# Patient Record
Sex: Female | Born: 1965 | Race: Black or African American | Hispanic: No | Marital: Married | Smoking: Never smoker
Health system: Southern US, Community
[De-identification: ages and names within clinical notes are randomized; demographics above are authoritative.]

## PROBLEM LIST (undated history)

## (undated) DIAGNOSIS — D49519 Neoplasm of unspecified behavior of unspecified kidney: Secondary | ICD-10-CM

## (undated) DIAGNOSIS — R011 Cardiac murmur, unspecified: Secondary | ICD-10-CM

## (undated) DIAGNOSIS — R198 Other specified symptoms and signs involving the digestive system and abdomen: Secondary | ICD-10-CM

## (undated) DIAGNOSIS — I10 Essential (primary) hypertension: Secondary | ICD-10-CM

## (undated) DIAGNOSIS — E119 Type 2 diabetes mellitus without complications: Secondary | ICD-10-CM

## (undated) DIAGNOSIS — E785 Hyperlipidemia, unspecified: Secondary | ICD-10-CM

## (undated) DIAGNOSIS — M1990 Unspecified osteoarthritis, unspecified site: Secondary | ICD-10-CM

---

## 2004-08-26 HISTORY — PX: SHOULDER SURGERY: SHX246

## 2007-01-19 ENCOUNTER — Encounter: Payer: Self-pay | Admitting: Orthopedic Surgery

## 2007-01-25 ENCOUNTER — Encounter: Payer: Self-pay | Admitting: Orthopedic Surgery

## 2007-02-24 ENCOUNTER — Encounter: Payer: Self-pay | Admitting: Orthopedic Surgery

## 2007-03-27 ENCOUNTER — Encounter: Payer: Self-pay | Admitting: Orthopedic Surgery

## 2007-11-12 ENCOUNTER — Encounter: Payer: Self-pay | Admitting: Orthopedic Surgery

## 2007-11-25 ENCOUNTER — Encounter: Payer: Self-pay | Admitting: Orthopedic Surgery

## 2009-05-29 ENCOUNTER — Encounter: Payer: Self-pay | Admitting: Orthopedic Surgery

## 2009-06-26 ENCOUNTER — Encounter: Payer: Self-pay | Admitting: Orthopedic Surgery

## 2009-07-26 ENCOUNTER — Encounter: Payer: Self-pay | Admitting: Orthopedic Surgery

## 2015-07-11 ENCOUNTER — Ambulatory Visit (HOSPITAL_COMMUNITY)
Admission: RE | Admit: 2015-07-11 | Discharge: 2015-07-11 | Disposition: A | Discharge status: Home / Self Care | Payer: Medicare Other | Source: Ambulatory Visit | Attending: Urology | Admitting: Urology

## 2015-07-11 ENCOUNTER — Other Ambulatory Visit (HOSPITAL_COMMUNITY): Payer: Self-pay | Admitting: Urology

## 2015-07-11 DIAGNOSIS — D49512 Neoplasm of unspecified behavior of left kidney: Secondary | ICD-10-CM

## 2015-07-11 DIAGNOSIS — I10 Essential (primary) hypertension: Secondary | ICD-10-CM | POA: Insufficient documentation

## 2015-07-12 ENCOUNTER — Other Ambulatory Visit: Payer: Self-pay | Admitting: Urology

## 2015-07-31 ENCOUNTER — Encounter (HOSPITAL_COMMUNITY): Payer: Self-pay

## 2015-07-31 ENCOUNTER — Encounter (HOSPITAL_COMMUNITY)
Admission: RE | Admit: 2015-07-31 | Discharge: 2015-07-31 | Disposition: A | Discharge status: Home / Self Care | Payer: Medicare Other | Source: Ambulatory Visit | Attending: Urology | Admitting: Urology

## 2015-07-31 HISTORY — DX: Unspecified osteoarthritis, unspecified site: M19.90

## 2015-07-31 HISTORY — DX: Cardiac murmur, unspecified: R01.1

## 2015-07-31 HISTORY — DX: Hyperlipidemia, unspecified: E78.5

## 2015-07-31 HISTORY — DX: Essential (primary) hypertension: I10

## 2015-07-31 HISTORY — DX: Neoplasm of unspecified behavior of unspecified kidney: D49.519

## 2015-07-31 HISTORY — DX: Other specified symptoms and signs involving the digestive system and abdomen: R19.8

## 2015-07-31 HISTORY — DX: Type 2 diabetes mellitus without complications: E11.9

## 2015-07-31 LAB — BASIC METABOLIC PANEL
Anion gap: 11 (ref 5–15)
BUN: 18 mg/dL (ref 6–20)
CO2: 26 mmol/L (ref 22–32)
Calcium: 9.9 mg/dL (ref 8.9–10.3)
Chloride: 104 mmol/L (ref 101–111)
Creatinine, Ser: 1.3 mg/dL — ABNORMAL HIGH (ref 0.44–1.00)
GFR calc Af Amer: 54 mL/min — ABNORMAL LOW (ref >60)
GFR calc non Af Amer: 47 mL/min — ABNORMAL LOW (ref >60)
Glucose, Bld: 201 mg/dL — ABNORMAL HIGH (ref 65–99)
Potassium: 3.7 mmol/L (ref 3.5–5.1)
Sodium: 141 mmol/L (ref 135–145)

## 2015-07-31 LAB — CBC
HCT: 39 % (ref 36.0–46.0)
Hemoglobin: 12.6 g/dL (ref 12.0–15.0)
MCH: 28.2 pg (ref 26.0–34.0)
MCHC: 32.3 g/dL (ref 30.0–36.0)
MCV: 87.2 fL (ref 78.0–100.0)
Platelets: 275 K/uL (ref 150–400)
RBC: 4.47 MIL/uL (ref 3.87–5.11)
RDW: 13.4 % (ref 11.5–15.5)
WBC: 9.5 K/uL (ref 4.0–10.5)

## 2015-07-31 LAB — ABO/RH: ABO/RH(D): O POS

## 2015-07-31 LAB — HCG, SERUM, QUALITATIVE: Preg, Serum: NEGATIVE

## 2015-07-31 NOTE — Patient Instructions (Addendum)
YOUR PROCEDURE IS SCHEDULED ON :  08/03/15  REPORT TO  HOSPITAL MAIN ENTRANCE FOLLOW SIGNS TO EAST ELEVATOR - GO TO 3rd FLOOR CHECK IN AT 3 EAST NURSES STATION (SHORT STAY) AT:  5:15 AM  CALL THIS NUMBER IF YOU HAVE PROBLEMS THE MORNING OF SURGERY [phone]  REMEMBER:ONLY 1 PER PERSON MAY GO TO SHORT STAY WITH YOU TO GET READY THE MORNING OF YOUR SURGERY  DO NOT EAT FOOD OR DRINK LIQUIDS AFTER MIDNIGHT  TAKE THESE MEDICINES THE MORNING OF SURGERY: LYRICA / HYDROCODONE   FOLLOW BOWEL PREP  YOU MAY NOT HAVE ANY METAL ON YOUR BODY INCLUDING HAIR PINS AND PIERCING'S. DO NOT WEAR JEWELRY, MAKEUP, LOTIONS, POWDERS OR PERFUMES. DO NOT WEAR NAIL POLISH. DO NOT SHAVE 48 HRS PRIOR TO SURGERY. MEN MAY SHAVE FACE AND NECK.  DO NOT [place]. [facility] IS NOT RESPONSIBLE FOR VALUABLES.  CONTACTS, DENTURES OR PARTIALS MAY NOT BE WORN TO SURGERY. LEAVE SUITCASE IN CAR. CAN BE BROUGHT TO ROOM AFTER SURGERY.  PATIENTS DISCHARGED THE DAY OF SURGERY WILL NOT BE ALLOWED TO DRIVE HOME.  PLEASE READ OVER THE FOLLOWING INSTRUCTION SHEETS _________________________________________________________________________________                                          [place] - PREPARING FOR SURGERY  Before surgery, you can play an important role.  Because skin is not sterile, your skin needs to be as free of germs as possible.  You can reduce the number of germs on your skin by washing with CHG (chlorahexidine gluconate) soap before surgery.  CHG is an antiseptic cleaner which kills germs and bonds with the skin to continue killing germs even after washing. Please DO NOT use if you have an allergy to CHG or antibacterial soaps.  If your skin becomes reddened/irritated stop using the CHG and inform your nurse when you arrive at Short Stay. Do not shave (including legs and underarms) for at least 48 hours prior to the first CHG shower.  You may shave your face. Please  follow these instructions carefully:   1.  Shower with CHG Soap the night before surgery and the  morning of Surgery.   2.  If you choose to wash your hair, wash your hair first as usual with your  normal  Shampoo.   3.  After you shampoo, rinse your hair and body thoroughly to remove the  shampoo.                                         4.  Use CHG as you would any other liquid soap.  You can apply chg directly  to the skin and wash . Gently wash with scrungie or clean wascloth    5.  Apply the CHG Soap to your body ONLY FROM THE NECK DOWN.   Do not use on open                           Wound or open sores. Avoid contact with eyes, ears mouth and genitals (private parts).                        Genitals (private  parts) with your normal soap.              6.  Wash thoroughly, paying special attention to the area where your surgery  will be performed.   7.  Thoroughly rinse your body with warm water from the neck down.   8.  DO NOT shower/wash with your normal soap after using and rinsing off  the CHG Soap .                9.  Pat yourself dry with a clean towel.             10.  Wear clean night clothes to bed after shower             11.  Place clean sheets on your bed the night of your first shower and do not  sleep with pets.  Day of Surgery : Do not apply any lotions/deodorants the morning of surgery.  Please wear clean clothes to the hospital/surgery center.  FAILURE TO FOLLOW THESE INSTRUCTIONS MAY RESULT IN THE CANCELLATION OF YOUR SURGERY    PATIENT SIGNATURE_________________________________  ______________________________________________________________________     Adam Phenix  An incentive spirometer is a tool that can help keep your lungs clear and active. This tool measures how well you are filling your lungs with each breath. Taking long deep breaths may help reverse or decrease the chance of developing breathing (pulmonary) problems (especially  infection) following:  A long period of time when you are unable to move or be active. BEFORE THE PROCEDURE   If the spirometer includes an indicator to show your best effort, your nurse or respiratory therapist will set it to a desired goal.  If possible, sit up straight or lean slightly forward. Try not to slouch.  Hold the incentive spirometer in an upright position. INSTRUCTIONS FOR USE   Sit on the edge of your bed if possible, or sit up as far as you can in bed or on a chair.  Hold the incentive spirometer in an upright position.  Breathe out normally.  Place the mouthpiece in your mouth and seal your lips tightly around it.  Breathe in slowly and as deeply as possible, raising the piston or the ball toward the top of the column.  Hold your breath for 3-5 seconds or for as long as possible. Allow the piston or ball to fall to the bottom of the column.  Remove the mouthpiece from your mouth and breathe out normally.  Rest for a few seconds and repeat Steps 1 through 7 at least 10 times every 1-2 hours when you are awake. Take your time and take a few normal breaths between deep breaths.  The spirometer may include an indicator to show your best effort. Use the indicator as a goal to work toward during each repetition.  After each set of 10 deep breaths, practice coughing to be sure your lungs are clear. If you have an incision (the cut made at the time of surgery), support your incision when coughing by placing a pillow or rolled up towels firmly against it. Once you are able to get out of bed, walk around indoors and cough well. You may stop using the incentive spirometer when instructed by your caregiver.  RISKS AND COMPLICATIONS  Take your time so you do not get dizzy or light-headed.  If you are in pain, you may need to take or ask for pain medication before doing incentive spirometry. It is harder to  take a deep breath if you are having pain. AFTER USE  Rest and  breathe slowly and easily.  It can be helpful to keep track of a log of your progress. Your caregiver can provide you with a simple table to help with this. If you are using the spirometer at home, follow these instructions: [place] IF:   You are having difficultly using the spirometer.  You have trouble using the spirometer as often as instructed.  Your pain medication is not giving enough relief while using the spirometer.  You develop fever of 100.5 F (38.1 C) or higher. SEEK IMMEDIATE MEDICAL CARE IF:   You cough up bloody sputum that had not been present before.  You develop fever of 102 F (38.9 C) or greater.  You develop worsening pain at or near the incision site. MAKE SURE YOU:   Understand these instructions.  Will watch your condition.  Will get help right away if you are not doing well or get worse. Document Released: 12/23/2006 Document Revised: 11/04/2011 Document Reviewed: 02/23/2007 ExitCare Patient Information 2014 [employer], [place].   ________________________________________________________________________  WHAT IS A BLOOD TRANSFUSION? Blood Transfusion Information  A transfusion is the replacement of blood or some of its parts. Blood is made up of multiple cells which provide different functions.  Red blood cells carry oxygen and are used for blood loss replacement.  White blood cells fight against infection.  Platelets control bleeding.  Plasma helps clot blood.  Other blood products are available for specialized needs, such as hemophilia or other clotting disorders. BEFORE THE TRANSFUSION  Who gives blood for transfusions?   Healthy volunteers who are fully evaluated to make sure their blood is safe. This is blood bank blood. Transfusion therapy is the safest it has ever been in the practice of medicine. Before blood is taken from a donor, a complete history is taken to make sure that person has no history of diseases nor engages in  risky social behavior (examples are intravenous drug use or sexual activity with multiple partners). The donor's travel history is screened to minimize risk of transmitting infections, such as malaria. The donated blood is tested for signs of infectious diseases, such as HIV and hepatitis. The blood is then tested to be sure it is compatible with you in order to minimize the chance of a transfusion reaction. If you or a relative donates blood, this is often done in anticipation of surgery and is not appropriate for emergency situations. It takes many days to process the donated blood. RISKS AND COMPLICATIONS Although transfusion therapy is very safe and saves many lives, the main dangers of transfusion include:   Getting an infectious disease.  Developing a transfusion reaction. This is an allergic reaction to something in the blood you were given. Every precaution is taken to prevent this. The decision to have a blood transfusion has been considered carefully by your caregiver before blood is given. Blood is not given unless the benefits outweigh the risks. AFTER THE TRANSFUSION  Right after receiving a blood transfusion, you will usually feel much better and more energetic. This is especially true if your red blood cells have gotten low (anemic). The transfusion raises the level of the red blood cells which carry oxygen, and this usually causes an energy increase.  The nurse administering the transfusion will monitor you carefully for complications. HOME CARE INSTRUCTIONS  No special instructions are needed after a transfusion. You may find your energy is better. Speak with your  caregiver about any limitations on activity for underlying diseases you may have. SEEK MEDICAL CARE IF:   Your condition is not improving after your transfusion.  You develop redness or irritation at the intravenous (IV) site. SEEK IMMEDIATE MEDICAL CARE IF:  Any of the following symptoms occur over the next 12  hours:  Shaking chills.  You have a temperature by mouth above 102 F (38.9 C), not controlled by medicine.  Chest, back, or muscle pain.  People around you feel you are not acting correctly or are confused.  Shortness of breath or difficulty breathing.  Dizziness and fainting.  You get a rash or develop hives.  You have a decrease in urine output.  Your urine turns a dark color or changes to pink, red, or brown. Any of the following symptoms occur over the next 10 days:  You have a temperature by mouth above 102 F (38.9 C), not controlled by medicine.  Shortness of breath.  Weakness after normal activity.  The white part of the eye turns yellow (jaundice).  You have a decrease in the amount of urine or are urinating less often.  Your urine turns a dark color or changes to pink, red, or brown. Document Released: 08/09/2000 Document Revised: 11/04/2011 Document Reviewed: 03/28/2008 [facility] Patient Information 2014 [place], [place].  _______________________________________________________________________

## 2015-07-31 NOTE — Progress Notes (Signed)
   07/31/15 0937  OBSTRUCTIVE SLEEP APNEA  Have you ever been diagnosed with sleep apnea through a sleep study? No  Do you snore loudly (loud enough to be heard through closed doors)?  1  Do you often feel tired, fatigued, or sleepy during the daytime (such as falling asleep during driving or talking to someone)? 1  Has anyone observed you stop breathing during your sleep? 0  Do you have, or are you being treated for high blood pressure? 1  BMI more than 35 kg/m2? 1  Age > 50 (1-yes) 1  Neck circumference greater than:Female 16 inches or larger, Female 17inches or larger? 1  Female Gender (Yes=1) 0  Obstructive Sleep Apnea Score 6  Score 5 or greater  Results sent to PCP

## 2015-08-01 LAB — HEMOGLOBIN A1C
Hgb A1c MFr Bld: 8.5 % — ABNORMAL HIGH (ref 4.8–5.6)
Mean Plasma Glucose: 197 mg/dL

## 2015-08-02 MED ORDER — DEXTROSE 5 % IV SOLN
3.0000 g | INTRAVENOUS | Status: AC
  Administered 2015-08-03: 2 g via INTRAVENOUS
  Administered 2015-08-03: 3 g via INTRAVENOUS
  Filled 2015-08-02: qty 3000

## 2015-08-02 NOTE — H&P (Signed)
Chief Complaint Left renal neoplasm   History of Present Illness Abigail Jackson is a 49 year old female seen today at the request of Dr. Caryl Pina for an incidentally detected left renal neoplasm. She underwent a CT scan of the chest on 09/25/14 for chest pain in order to rule out a possible pulmonary embolus. She did not have a worrisome findings on her CT scan of the chest but did have an incidentally detected left upper pole renal mass that appeared hyperdense worrisome for possible renal malignancy. This mass was not initially further evaluated and it was multiple months before it was realized that she did have this incidental finding and was therefore referred to Dr. Titus Mould for further evaluation. He ordered a CT scan of the abdomen with and without IV contrast on 05/31/15 which returned a 3.5 cm heterogeneous enhancing mass of the medial aspect of the left upper kidney consistent with renal cell carcinoma. This mass did appear enlarged compared to January. No regional lymphadenopathy, contralateral concerning renal masses, adrenal abnormalities, renal vein involvement/IVC involvement, or other evidence of metastatic disease was noted. She has denied any gross hematuria or abdominal pain symptoms.    Her main complaint at this time is lower back pain with radiation to her lower extremities. She does have a known bulging lumbar disc apparently and has been undergoing conservative therapy for this. Her medical history is otherwise significant for arthritis, diabetes, hyperlipidemia, hypertension, gastroesophageal reflux disease, and her neurologic pain related to her back issues.   Past Medical History Problems  1. History of arthritis (Z87.39) 2. History of depression (Z86.59) 3. History of diabetes mellitus (Z86.39) 4. History of hyperlipidemia (Z86.39) 5. History of hypertension (Z86.79)  Surgical History Problems  1. History of Shoulder Surgery  Current Meds 1. Amitriptyline HCl - 75  MG Oral Tablet;  Therapy: (Recorded:15Nov2016) to Recorded 2. Aspir-81 81 MG Oral Tablet Delayed Release;  Therapy: (Recorded:15Nov2016) to Recorded 3. Hydrochlorothiazide 25 MG Oral Tablet;  Therapy: (Recorded:15Nov2016) to Recorded 4. Hydrocodone-Acetaminophen 7.5-325 MG Oral Tablet;  Therapy: (Recorded:15Nov2016) to Recorded 5. Lisinopril 40 MG Oral Tablet;  Therapy: (Recorded:15Nov2016) to Recorded 6. MetFORMIN HCl - 500 MG Oral Tablet;  Therapy: (Recorded:15Nov2016) to Recorded 7. Omeprazole 40 MG Oral Capsule Delayed Release;  Therapy: (Recorded:15Nov2016) to Recorded  Allergies Medication  1. TraMADol HCl TABS  Family History Problems  1. Family history of cardiac arrhythmia (Z82.49) : Mother 2. Denied: Family history of kidney cancer 3. Family history of renal failure (Z84.1) : Father  Social History Problems    Denied: History of Alcohol use   Married   separated   Never a smoker   Occupation   disabled  Review of Systems Genitourinary, constitutional, skin, eye, otolaryngeal, hematologic/lymphatic, cardiovascular, pulmonary, endocrine, musculoskeletal, gastrointestinal, neurological and psychiatric system(s) were reviewed and pertinent findings if present are noted and are otherwise negative.  Musculoskeletal: back pain and joint pain.    Vitals Vital Signs [Data Includes: Last 1 Day]  Recorded: [id number] 01:17PM  Height: 5 ft 3 in Weight: 264 lb  BMI Calculated: 46.77 BSA Calculated: 2.18 Blood Pressure: 121 / 69 Heart Rate: 96  Physical Exam Constitutional: Well nourished and well developed . No acute distress.  ENT:. The ears and nose are normal in appearance.  Neck: The appearance of the neck is normal and no neck mass is present.  Pulmonary: No respiratory distress, normal respiratory rhythm and effort and clear bilateral breath sounds.  Cardiovascular: Heart rate and rhythm are normal . No peripheral edema.  Abdomen:  The abdomen is obese.  The abdomen is soft and nontender. No masses are palpated. No CVA tenderness. No hernias are palpable. No hepatosplenomegaly noted.  Lymphatics: The supraclavicular, femoral and inguinal nodes are not enlarged or tender.  Skin: Normal skin turgor, no visible rash and no visible skin lesions.  Neuro/Psych:. Mood and affect are appropriate.    Results/Data Urine [Data Includes: Last 1 Day]   [id number]  COLOR YELLOW   APPEARANCE CLEAR   SPECIFIC GRAVITY 1.015   pH 6.0   GLUCOSE NEGATIVE   BILIRUBIN NEGATIVE   KETONE NEGATIVE   BLOOD TRACE   PROTEIN NEGATIVE   NITRITE NEGATIVE   LEUKOCYTE ESTERASE NEGATIVE   SQUAMOUS EPITHELIAL/HPF 10-20 HPF  WBC 0-5 WBC/HPF  RBC NONE SEEN RBC/HPF  BACTERIA MANY HPF  CRYSTALS NONE SEEN HPF  CASTS NONE SEEN LPF  Yeast NONE SEEN HPF   Urine has been cultured although her urinalysis is contaminated.    I have independently reviewed her medical records and CT scan report. I independently reviewed her CT scan today and reviewed this with the patient. Findings are as dictated above.   Assessment Assessed  1. 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 URINE CULTURE; Status:In Progress - Specimen/Data Collected;   Done: [id number] Health Maintenance  2. UA With REFLEX; [Do Not Release]; Status:Complete;   Done: [id number] 12:04PM Neoplasm of left kidney  3. Follow-up Office  Follow-up  Status: Hold For - Appointment,Date of Service  Requested  for: [id number] 4. CBC w/PLT NO DIFF; Status:In Progress - Specimen/Data Collected;   Done: [id number] 5. CHEST X-RAY; Status:Hold For - Print,Records; Requested for:15Nov2016;  6. CMP with Estimated GFR; Status:In Progress - Specimen/Data Collected;   Done:  [id number] 7. VENIPUNCTURE; Status:Complete;   Done: [id number]  Discussion/Summary 1. Left renal neoplasm concerning for renal malignancy: I reviewed the patient's CT scan with her. Her renal mass does appear concerning for a  renal cell carcinoma. She will complete her metastatic evaluation today with a chest x-ray and laboratory studies.   The patient was provided information regarding their renal mass including the relative risk of benign versus malignant pathology and the natural history of renal cell carcinoma and other possible malignancies of the kidney. The role of renal biopsy, laboratory testing, and imaging studies to further characterize renal masses and/or the presence of metastatic disease were explained. We discussed the role of active surveillance, surgical therapy with both radical nephrectomy and nephron-sparing surgery, and ablative therapy in the treatment of renal masses. In addition, we discussed our goals of providing an accurate diagnosis and oncologic control while maintaining optimal renal function as appropriate based on the size, location, and complexity of their renal mass as well as their co-morbidities.    We have discussed the risks of treatment in detail including but not limited to bleeding, infection, heart attack, stroke, death, venothromoboembolism, cancer recurrence, injury/damage to surrounding organs and structures, urine leak, the possibility of open surgical conversion for patients undergoing minimally invasive surgery, the risk of developing chronic kidney disease and its associated implications, and the potential risk of end stage renal disease possibly necessitating dialysis.     After reviewing options, she does wish to proceed with curative surgical therapy. She will be scheduled for a left robot-assisted laparoscopic partial nephrectomy. She understands that her back pain would not be related to her renal mass.   A total of 55 minutes were spent in the overall care of the patient today with 45 minutes  in direct face to face consultation.     Cc: Dr. Holly Bodily  Dr. Edwin Dada    Verified Results CMP with Estimated GFR1 [id number] 02:00PM1 Read Drivers  SPECIMEN  TYPE: BLOOD  [Jul 12, 2015 8:56AM Chadwick Reiswig] Please let patient know that labs are ok. Her calcium level is very mildly elevated and something that we will recheck in the future but she can certainly proceed with surgery as planned as there does not appear to be concern for other problems.   Test Name Result Flag Reference  SODIUM1 139 mmol/L1  [phone]  POTASSIUM1 4.9 mmol/L1  3.5-5.31  CHLORIDE1 105 mmol/L1  98-1101  CO21 25 mmol/L1  20-311  GLUCOSE1 114 mg/dL1 H1 65-991  BUN1 17 mg/dL1  7-251  CREATININE1 1.24 mg/dL1  0.50-1.401  BILIRUBIN, TOTAL1 0.3 mg/dL1  0.2-1.21  ALKALINE PHOSPHATASE1 28 U/L1 L1 33-1301  AST/SGOT1 17 U/L1  10-351  ALT/SGPT1 18 U/L1  6-291  TOTAL PROTEIN1 7.1 g/dL1  6.1-8.11  ALBUMIN1 4.4 g/dL1  3.6-5.11  CALCIUM1 10.6 mg/dL1 H1 8.6-10.41  Est GFR, African American1 58 mL/min1 L1 >=601  Est GFR, NonAfrican American1 50 mL/min1 L1 >=601  THE ESTIMATED GFR IS A CALCULATION VALID FOR ADULTS (>=65 YEARS OLD) THAT USES THE CKD-EPI ALGORITHM TO ADJUST FOR AGE AND SEX. IT IS   NOT TO BE USED FOR CHILDREN, PREGNANT WOMEN, HOSPITALIZED PATIENTS,    PATIENTS ON DIALYSIS, OR WITH RAPIDLY CHANGING KIDNEY FUNCTION. ACCORDING TO THE NKDEP, EGFR >89 IS NORMAL, 60-89 SHOWS MILD IMPAIRMENT, 30-59 SHOWS MODERATE IMPAIRMENT, 15-29 SHOWS SEVERE IMPAIRMENT AND <15 IS ESRD.   CBC w/PLT NO DIFF1 [id number] 01:59PM1 Read Drivers  SPECIMEN TYPE: BLOOD  [Jul 12, 2015 8:56AM Gurkirat Basher] Please let patient know that labs are ok. Her calcium level is very mildly elevated and something that we will recheck in the future but she can certainly proceed with surgery as planned as there does not appear to be concern for other problems.   Test Name Result Flag Reference  WBC1 9.2 K/uL1  4.0-10.51  RBC1 4.49 MIL/uL1  3.87-5.111  HEMOGLOBIN1 12.6 g/dL1  12.0-15.01  HEMATOCRIT1 38.8 %1  36.0-46.01  MCV1 86.4 fL1  78.0-100.01  MCH1 28.1 pg1  26.0-34.01  MCHC1 32.5 g/dL1   30.0-36.01  RDW1 14.3 %1  11.5-15.51  PLATELET COUNT1 281 K/uL1  [phone]  MPV1 10.2 fL1  8.6-12.41     1. Amended By: Raynelle Bring; Jul 12 2015 9:56 AM EST  Signatures Electronically signed by : Raynelle Bring, M.D.; Jul 12 2015  8:56AM EST

## 2015-08-03 ENCOUNTER — Encounter (HOSPITAL_COMMUNITY)
Admission: RE | Disposition: A | Discharge status: Home / Self Care | Payer: Self-pay | Source: Ambulatory Visit | Attending: Urology

## 2015-08-03 ENCOUNTER — Encounter (HOSPITAL_COMMUNITY): Payer: Self-pay | Admitting: *Deleted

## 2015-08-03 ENCOUNTER — Inpatient Hospital Stay (HOSPITAL_COMMUNITY)
Admission: RE | Admit: 2015-08-03 | Discharge: 2015-08-08 | DRG: 657 | Disposition: A | Discharge status: Home / Self Care | Payer: Medicare Other | Source: Ambulatory Visit | Attending: Urology | Admitting: Urology

## 2015-08-03 ENCOUNTER — Inpatient Hospital Stay (HOSPITAL_COMMUNITY): Payer: Medicare Other | Admitting: Anesthesiology

## 2015-08-03 ENCOUNTER — Other Ambulatory Visit: Payer: Self-pay

## 2015-08-03 DIAGNOSIS — Z6841 Body Mass Index (BMI) 40.0 and over, adult: Secondary | ICD-10-CM

## 2015-08-03 DIAGNOSIS — D49519 Neoplasm of unspecified behavior of unspecified kidney: Secondary | ICD-10-CM

## 2015-08-03 DIAGNOSIS — I10 Essential (primary) hypertension: Secondary | ICD-10-CM | POA: Diagnosis present

## 2015-08-03 DIAGNOSIS — R918 Other nonspecific abnormal finding of lung field: Secondary | ICD-10-CM | POA: Diagnosis present

## 2015-08-03 DIAGNOSIS — N2889 Other specified disorders of kidney and ureter: Secondary | ICD-10-CM | POA: Diagnosis present

## 2015-08-03 DIAGNOSIS — Z79899 Other long term (current) drug therapy: Secondary | ICD-10-CM | POA: Diagnosis not present

## 2015-08-03 DIAGNOSIS — N179 Acute kidney failure, unspecified: Secondary | ICD-10-CM | POA: Diagnosis not present

## 2015-08-03 DIAGNOSIS — R109 Unspecified abdominal pain: Secondary | ICD-10-CM | POA: Diagnosis present

## 2015-08-03 DIAGNOSIS — R000 Tachycardia, unspecified: Secondary | ICD-10-CM | POA: Diagnosis not present

## 2015-08-03 DIAGNOSIS — E119 Type 2 diabetes mellitus without complications: Secondary | ICD-10-CM | POA: Diagnosis present

## 2015-08-03 DIAGNOSIS — Z7982 Long term (current) use of aspirin: Secondary | ICD-10-CM | POA: Diagnosis not present

## 2015-08-03 DIAGNOSIS — D649 Anemia, unspecified: Secondary | ICD-10-CM | POA: Diagnosis not present

## 2015-08-03 DIAGNOSIS — C642 Malignant neoplasm of left kidney, except renal pelvis: Principal | ICD-10-CM | POA: Diagnosis present

## 2015-08-03 DIAGNOSIS — M1990 Unspecified osteoarthritis, unspecified site: Secondary | ICD-10-CM | POA: Diagnosis present

## 2015-08-03 DIAGNOSIS — E785 Hyperlipidemia, unspecified: Secondary | ICD-10-CM | POA: Diagnosis present

## 2015-08-03 DIAGNOSIS — Z905 Acquired absence of kidney: Secondary | ICD-10-CM

## 2015-08-03 DIAGNOSIS — D72829 Elevated white blood cell count, unspecified: Secondary | ICD-10-CM | POA: Diagnosis not present

## 2015-08-03 DIAGNOSIS — Z01812 Encounter for preprocedural laboratory examination: Secondary | ICD-10-CM

## 2015-08-03 DIAGNOSIS — J9811 Atelectasis: Secondary | ICD-10-CM | POA: Diagnosis not present

## 2015-08-03 DIAGNOSIS — E860 Dehydration: Secondary | ICD-10-CM | POA: Diagnosis not present

## 2015-08-03 DIAGNOSIS — G4733 Obstructive sleep apnea (adult) (pediatric): Secondary | ICD-10-CM | POA: Diagnosis present

## 2015-08-03 DIAGNOSIS — K219 Gastro-esophageal reflux disease without esophagitis: Secondary | ICD-10-CM | POA: Diagnosis present

## 2015-08-03 HISTORY — PX: ROBOTIC ASSITED PARTIAL NEPHRECTOMY: SHX6087

## 2015-08-03 LAB — BASIC METABOLIC PANEL
Anion gap: 14 (ref 5–15)
BUN: 12 mg/dL (ref 6–20)
CO2: 24 mmol/L (ref 22–32)
Calcium: 9.3 mg/dL (ref 8.9–10.3)
Chloride: 100 mmol/L — ABNORMAL LOW (ref 101–111)
Creatinine, Ser: 1.63 mg/dL — ABNORMAL HIGH (ref 0.44–1.00)
GFR calc Af Amer: 41 mL/min — ABNORMAL LOW (ref >60)
GFR calc non Af Amer: 36 mL/min — ABNORMAL LOW (ref >60)
Glucose, Bld: 230 mg/dL — ABNORMAL HIGH (ref 65–99)
Potassium: 4.1 mmol/L (ref 3.5–5.1)
Sodium: 138 mmol/L (ref 135–145)

## 2015-08-03 LAB — GLUCOSE, CAPILLARY
Glucose-Capillary: 140 mg/dL — ABNORMAL HIGH (ref 65–99)
Glucose-Capillary: 203 mg/dL — ABNORMAL HIGH (ref 65–99)
Glucose-Capillary: 158 mg/dL — ABNORMAL HIGH (ref 65–99)

## 2015-08-03 LAB — HEMOGLOBIN AND HEMATOCRIT, BLOOD
HCT: 37.1 % (ref 36.0–46.0)
Hemoglobin: 12 g/dL (ref 12.0–15.0)

## 2015-08-03 LAB — TYPE AND SCREEN
ABO/RH(D): O POS
Antibody Screen: NEGATIVE

## 2015-08-03 SURGERY — ROBOTIC ASSITED PARTIAL NEPHRECTOMY
Anesthesia: General | Laterality: Left

## 2015-08-03 MED ORDER — INSULIN ASPART 100 UNIT/ML ~~LOC~~ SOLN
SUBCUTANEOUS | Status: AC
  Filled 2015-08-03: qty 1

## 2015-08-03 MED ORDER — MORPHINE SULFATE (PF) 2 MG/ML IV SOLN
2.0000 mg | INTRAVENOUS | Status: DC | PRN
  Administered 2015-08-03: 4 mg via INTRAVENOUS
  Administered 2015-08-03: 2 mg via INTRAVENOUS
  Administered 2015-08-03 – 2015-08-04 (×4): 4 mg via INTRAVENOUS
  Filled 2015-08-03: qty 2
  Filled 2015-08-03: qty 1
  Filled 2015-08-03 (×4): qty 2

## 2015-08-03 MED ORDER — SODIUM CHLORIDE 0.45 % IV SOLN
INTRAVENOUS | Status: DC
  Administered 2015-08-03 – 2015-08-04 (×3): via INTRAVENOUS

## 2015-08-03 MED ORDER — ROCURONIUM BROMIDE 100 MG/10ML IV SOLN
INTRAVENOUS | Status: DC | PRN
  Administered 2015-08-03: 10 mg via INTRAVENOUS
  Administered 2015-08-03: 50 mg via INTRAVENOUS
  Administered 2015-08-03 (×4): 10 mg via INTRAVENOUS
  Administered 2015-08-03: 20 mg via INTRAVENOUS

## 2015-08-03 MED ORDER — PROMETHAZINE HCL 25 MG/ML IJ SOLN: 6.2500 mg | INTRAMUSCULAR | Status: DC | PRN

## 2015-08-03 MED ORDER — DIPHENHYDRAMINE HCL 12.5 MG/5ML PO ELIX: 12.5000 mg | ORAL_SOLUTION | Freq: Four times a day (QID) | ORAL | Status: DC | PRN

## 2015-08-03 MED ORDER — PREGABALIN 75 MG PO CAPS
75.0000 mg | ORAL_CAPSULE | Freq: Two times a day (BID) | ORAL | Status: DC
  Administered 2015-08-03 – 2015-08-07 (×9): 75 mg via ORAL
  Filled 2015-08-03 (×9): qty 1

## 2015-08-03 MED ORDER — SODIUM CHLORIDE 0.9 % IJ SOLN
INTRAMUSCULAR | Status: AC
  Filled 2015-08-03: qty 20

## 2015-08-03 MED ORDER — MIDAZOLAM HCL 5 MG/5ML IJ SOLN
INTRAMUSCULAR | Status: DC | PRN
  Administered 2015-08-03: 2 mg via INTRAVENOUS

## 2015-08-03 MED ORDER — LIDOCAINE HCL (CARDIAC) 20 MG/ML IV SOLN
INTRAVENOUS | Status: AC
  Filled 2015-08-03: qty 5

## 2015-08-03 MED ORDER — SODIUM CHLORIDE 0.9 % IJ SOLN
INTRAMUSCULAR | Status: DC | PRN
  Administered 2015-08-03: 20 mL via INTRAVENOUS

## 2015-08-03 MED ORDER — MORPHINE SULFATE (PF) 10 MG/ML IV SOLN: 2.0000 mg | INTRAVENOUS | Status: DC | PRN

## 2015-08-03 MED ORDER — LISINOPRIL 40 MG PO TABS
40.0000 mg | ORAL_TABLET | Freq: Every day | ORAL | Status: DC
  Administered 2015-08-04 – 2015-08-06 (×3): 40 mg via ORAL
  Filled 2015-08-03 (×2): qty 2
  Filled 2015-08-03 (×2): qty 1
  Filled 2015-08-03: qty 2
  Filled 2015-08-03: qty 1

## 2015-08-03 MED ORDER — ONDANSETRON HCL 4 MG/2ML IJ SOLN: 4.0000 mg | INTRAMUSCULAR | Status: DC | PRN

## 2015-08-03 MED ORDER — HYDROCHLOROTHIAZIDE 25 MG PO TABS
25.0000 mg | ORAL_TABLET | Freq: Every day | ORAL | Status: DC
  Administered 2015-08-04 – 2015-08-06 (×3): 25 mg via ORAL
  Filled 2015-08-03 (×3): qty 1

## 2015-08-03 MED ORDER — STERILE WATER FOR IRRIGATION IR SOLN
Status: DC | PRN
  Administered 2015-08-03: 1000 mL

## 2015-08-03 MED ORDER — INSULIN ASPART 100 UNIT/ML ~~LOC~~ SOLN
0.0000 [IU] | SUBCUTANEOUS | Status: DC
  Administered 2015-08-03: 3 [IU] via SUBCUTANEOUS
  Administered 2015-08-03: 2 [IU] via SUBCUTANEOUS
  Administered 2015-08-03: 5 [IU] via SUBCUTANEOUS
  Administered 2015-08-04: 3 [IU] via SUBCUTANEOUS
  Administered 2015-08-04 (×4): 2 [IU] via SUBCUTANEOUS
  Administered 2015-08-04: 5 [IU] via SUBCUTANEOUS
  Administered 2015-08-05: 2 [IU] via SUBCUTANEOUS
  Administered 2015-08-05: 3 [IU] via SUBCUTANEOUS
  Administered 2015-08-05 (×2): 2 [IU] via SUBCUTANEOUS
  Administered 2015-08-05 – 2015-08-06 (×2): 3 [IU] via SUBCUTANEOUS
  Administered 2015-08-06 – 2015-08-07 (×4): 2 [IU] via SUBCUTANEOUS
  Administered 2015-08-07: 3 [IU] via SUBCUTANEOUS

## 2015-08-03 MED ORDER — PROPOFOL 10 MG/ML IV BOLUS
INTRAVENOUS | Status: DC | PRN
  Administered 2015-08-03: 200 mg via INTRAVENOUS

## 2015-08-03 MED ORDER — MANNITOL 25 % IV SOLN
25.0000 g | Freq: Once | INTRAVENOUS | Status: AC
Start: 2015-08-03 — End: 2015-08-03
  Administered 2015-08-03 (×2): 12.5 g via INTRAVENOUS
  Filled 2015-08-03: qty 100

## 2015-08-03 MED ORDER — BUPIVACAINE LIPOSOME 1.3 % IJ SUSP
20.0000 mL | Freq: Once | INTRAMUSCULAR | Status: AC
  Administered 2015-08-03: 20 mL
  Filled 2015-08-03: qty 20

## 2015-08-03 MED ORDER — HEMOSTATIC AGENTS (NO CHARGE) OPTIME
TOPICAL | Status: DC | PRN
  Administered 2015-08-03: 1

## 2015-08-03 MED ORDER — CEFAZOLIN SODIUM 1-5 GM-% IV SOLN
1.0000 g | Freq: Three times a day (TID) | INTRAVENOUS | Status: AC
  Administered 2015-08-03 – 2015-08-04 (×2): 1 g via INTRAVENOUS
  Filled 2015-08-03 (×2): qty 50

## 2015-08-03 MED ORDER — DOCUSATE SODIUM 100 MG PO CAPS
100.0000 mg | ORAL_CAPSULE | Freq: Two times a day (BID) | ORAL | Status: DC
  Administered 2015-08-03 – 2015-08-07 (×9): 100 mg via ORAL
  Filled 2015-08-03 (×9): qty 1

## 2015-08-03 MED ORDER — ACETAMINOPHEN 10 MG/ML IV SOLN
1000.0000 mg | Freq: Four times a day (QID) | INTRAVENOUS | Status: DC
  Administered 2015-08-03 – 2015-08-04 (×3): 1000 mg via INTRAVENOUS
  Filled 2015-08-03 (×4): qty 100

## 2015-08-03 MED ORDER — PROPOFOL 10 MG/ML IV BOLUS
INTRAVENOUS | Status: AC
  Filled 2015-08-03: qty 20

## 2015-08-03 MED ORDER — SUCCINYLCHOLINE CHLORIDE 20 MG/ML IJ SOLN
INTRAMUSCULAR | Status: DC | PRN
  Administered 2015-08-03: 200 mg via INTRAVENOUS

## 2015-08-03 MED ORDER — FENTANYL CITRATE (PF) 250 MCG/5ML IJ SOLN
INTRAMUSCULAR | Status: AC
  Filled 2015-08-03: qty 5

## 2015-08-03 MED ORDER — FENTANYL CITRATE (PF) 100 MCG/2ML IJ SOLN
INTRAMUSCULAR | Status: AC
  Filled 2015-08-03: qty 2

## 2015-08-03 MED ORDER — MEPERIDINE HCL 50 MG/ML IJ SOLN: 6.2500 mg | INTRAMUSCULAR | Status: DC | PRN

## 2015-08-03 MED ORDER — DIPHENHYDRAMINE HCL 50 MG/ML IJ SOLN
12.5000 mg | Freq: Four times a day (QID) | INTRAMUSCULAR | Status: DC | PRN
Start: 2015-08-03 — End: 2015-08-08

## 2015-08-03 MED ORDER — LIDOCAINE HCL (CARDIAC) 20 MG/ML IV SOLN
INTRAVENOUS | Status: DC | PRN
  Administered 2015-08-03: 50 mg via INTRAVENOUS

## 2015-08-03 MED ORDER — MIDAZOLAM HCL 2 MG/2ML IJ SOLN
INTRAMUSCULAR | Status: AC
  Filled 2015-08-03: qty 2

## 2015-08-03 MED ORDER — SUGAMMADEX SODIUM 500 MG/5ML IV SOLN
INTRAVENOUS | Status: AC
  Filled 2015-08-03: qty 5

## 2015-08-03 MED ORDER — SUGAMMADEX SODIUM 500 MG/5ML IV SOLN
INTRAVENOUS | Status: DC | PRN
  Administered 2015-08-03: 250 mg via INTRAVENOUS

## 2015-08-03 MED ORDER — AMITRIPTYLINE HCL 75 MG PO TABS
75.0000 mg | ORAL_TABLET | Freq: Every day | ORAL | Status: DC
  Administered 2015-08-03 – 2015-08-07 (×5): 75 mg via ORAL
  Filled 2015-08-03 (×6): qty 1

## 2015-08-03 MED ORDER — PRAVASTATIN SODIUM 40 MG PO TABS
40.0000 mg | ORAL_TABLET | Freq: Every day | ORAL | Status: DC
  Administered 2015-08-03 – 2015-08-07 (×5): 40 mg via ORAL
  Filled 2015-08-03 (×5): qty 1

## 2015-08-03 MED ORDER — HYDROCODONE-ACETAMINOPHEN 7.5-325 MG PO TABS: 1.0000 | ORAL_TABLET | Freq: Four times a day (QID) | ORAL | Status: DC | PRN

## 2015-08-03 MED ORDER — LACTATED RINGERS IV SOLN: INTRAVENOUS | Status: DC

## 2015-08-03 MED ORDER — LACTATED RINGERS IV SOLN
INTRAVENOUS | Status: DC | PRN
  Administered 2015-08-03: 08:00:00 via INTRAVENOUS

## 2015-08-03 MED ORDER — FENTANYL CITRATE (PF) 100 MCG/2ML IJ SOLN
25.0000 ug | INTRAMUSCULAR | Status: DC | PRN
  Administered 2015-08-03 (×3): 50 ug via INTRAVENOUS

## 2015-08-03 MED ORDER — LACTATED RINGERS IV SOLN
INTRAVENOUS | Status: DC | PRN
  Administered 2015-08-03: 07:00:00 via INTRAVENOUS

## 2015-08-03 MED ORDER — PANTOPRAZOLE SODIUM 40 MG PO TBEC
40.0000 mg | DELAYED_RELEASE_TABLET | Freq: Every day | ORAL | Status: DC
  Administered 2015-08-04 – 2015-08-07 (×4): 40 mg via ORAL
  Filled 2015-08-03 (×4): qty 1

## 2015-08-03 MED ORDER — LACTATED RINGERS IV SOLN
INTRAVENOUS | Status: DC
  Administered 2015-08-03: 1000 mL via INTRAVENOUS

## 2015-08-03 MED ORDER — FENTANYL CITRATE (PF) 100 MCG/2ML IJ SOLN
INTRAMUSCULAR | Status: DC | PRN
  Administered 2015-08-03 (×3): 50 ug via INTRAVENOUS
  Administered 2015-08-03: 100 ug via INTRAVENOUS
  Administered 2015-08-03 (×5): 50 ug via INTRAVENOUS

## 2015-08-03 MED ORDER — LACTATED RINGERS IR SOLN
Status: DC | PRN
  Administered 2015-08-03: 1000 mL

## 2015-08-03 MED ORDER — ONDANSETRON HCL 4 MG/2ML IJ SOLN
INTRAMUSCULAR | Status: AC
  Filled 2015-08-03: qty 2

## 2015-08-03 SURGICAL SUPPLY — 58 items
APL ESCP 34 STRL LF DISP (HEMOSTASIS)
APPLICATOR SURGIFLO ENDO (HEMOSTASIS) IMPLANT
BAG SPEC RTRVL LRG 6X4 10 (ENDOMECHANICALS) ×1
CABLE HIGH FREQUENCY MONO STRZ (ELECTRODE) ×2 IMPLANT
CHLORAPREP W/TINT 26ML (MISCELLANEOUS) ×2 IMPLANT
CLIP LIGATING HEM O LOK PURPLE (MISCELLANEOUS) ×2 IMPLANT
CLIP LIGATING HEMO O LOK GREEN (MISCELLANEOUS) ×4 IMPLANT
CORDS BIPOLAR (ELECTRODE) ×2 IMPLANT
COVER BACK TABLE 60X90IN (DRAPES) ×2 IMPLANT
COVER SURGICAL LIGHT HANDLE (MISCELLANEOUS) ×2 IMPLANT
COVER TIP SHEARS 8 DVNC (MISCELLANEOUS) ×1 IMPLANT
COVER TIP SHEARS 8MM DA VINCI (MISCELLANEOUS) ×1
DECANTER SPIKE VIAL GLASS SM (MISCELLANEOUS) ×2 IMPLANT
DRAIN CHANNEL 15F RND FF 3/16 (WOUND CARE) ×2 IMPLANT
DRAPE INCISE IOBAN 66X45 STRL (DRAPES) ×2 IMPLANT
DRAPE INSTRUMENT ARM DA VINCI (DRAPES) ×4
DRAPE INSTRUMENT ARM DVNC (DRAPES) IMPLANT
DRAPE LAPAROSCOPIC ABDOMINAL (DRAPES) IMPLANT
DRAPE SHEET LG 3/4 BI-LAMINATE (DRAPES) ×2 IMPLANT
DRAPE WARM FLUID 44X44 (DRAPE) ×2 IMPLANT
DRSG TEGADERM 4X4.75 (GAUZE/BANDAGES/DRESSINGS) ×2 IMPLANT
ELECT PENCIL ROCKER SW 15FT (MISCELLANEOUS) ×2 IMPLANT
ELECT REM PT RETURN 9FT ADLT (ELECTROSURGICAL) ×2
ELECTRODE REM PT RTRN 9FT ADLT (ELECTROSURGICAL) ×1 IMPLANT
EVACUATOR SILICONE 100CC (DRAIN) ×2 IMPLANT
GLOVE BIO SURGEON STRL SZ 6.5 (GLOVE) ×2 IMPLANT
GLOVE BIOGEL M STRL SZ7.5 (GLOVE) ×18 IMPLANT
GOWN STRL REUS W/TWL LRG LVL3 (GOWN DISPOSABLE) ×12 IMPLANT
HEMOSTAT SURGICEL 4X8 (HEMOSTASIS) ×1 IMPLANT
KIT ACCESSORY DA VINCI DISP (KITS)
KIT ACCESSORY DVNC DISP (KITS) ×1 IMPLANT
KIT BASIN OR (CUSTOM PROCEDURE TRAY) ×2 IMPLANT
LIQUID BAND (GAUZE/BANDAGES/DRESSINGS) ×3 IMPLANT
POSITIONER SURGICAL ARM (MISCELLANEOUS) ×4 IMPLANT
POUCH SPECIMEN RETRIEVAL 10MM (ENDOMECHANICALS) ×2 IMPLANT
SET TUBE IRRIG SUCTION NO TIP (IRRIGATION / IRRIGATOR) ×2 IMPLANT
SOLUTION ELECTROLUBE (MISCELLANEOUS) ×2 IMPLANT
SPONGE LAP 4X18 X RAY DECT (DISPOSABLE) ×1 IMPLANT
SURGIFLO W/THROMBIN 8M KIT (HEMOSTASIS) ×3 IMPLANT
SUT ETHILON 3 0 PS 1 (SUTURE) ×2 IMPLANT
SUT MNCRL AB 4-0 PS2 18 (SUTURE) ×4 IMPLANT
SUT V-LOC BARB 180 2/0GR6 GS22 (SUTURE) ×2
SUT VIC AB 0 CT1 27 (SUTURE) ×2
SUT VIC AB 0 CT1 27XBRD ANTBC (SUTURE) ×1 IMPLANT
SUT VIC AB 2-0 SH 27 (SUTURE) ×2
SUT VIC AB 2-0 SH 27X BRD (SUTURE) IMPLANT
SUT VICRYL 0 UR6 27IN ABS (SUTURE) ×4 IMPLANT
SUT VLOC BARB 180 ABS3/0GR12 (SUTURE) ×2
SUTURE V-LC BRB 180 2/0GR6GS22 (SUTURE) ×1 IMPLANT
SUTURE VLOC BRB 180 ABS3/0GR12 (SUTURE) ×1 IMPLANT
TOWEL OR 17X26 10 PK STRL BLUE (TOWEL DISPOSABLE) ×4 IMPLANT
TRAY FOLEY W/METER SILVER 16FR (SET/KITS/TRAYS/PACK) ×2 IMPLANT
TRAY LAPAROSCOPIC (CUSTOM PROCEDURE TRAY) ×2 IMPLANT
TROCAR 12M 150ML BLUNT (TROCAR) ×1 IMPLANT
TROCAR BLADELESS OPT 5 100 (ENDOMECHANICALS) ×2 IMPLANT
TROCAR UNIVERSAL OPT 12M 100M (ENDOMECHANICALS) ×2 IMPLANT
TROCAR XCEL 12X100 BLDLESS (ENDOMECHANICALS) ×2 IMPLANT
WATER STERILE IRR 1500ML POUR (IV SOLUTION) ×3 IMPLANT

## 2015-08-03 NOTE — Anesthesia Procedure Notes (Signed)
Procedure Name: Intubation Date/Time: 08/03/2015 7:32 AM Performed by: Anne Fu Pre-anesthesia Checklist: Patient identified, Emergency Drugs available, Suction available, Patient being monitored and Timeout performed Patient Re-evaluated:Patient Re-evaluated prior to inductionOxygen Delivery Method: Circle system utilized Preoxygenation: Pre-oxygenation with 100% oxygen Intubation Type: IV induction Ventilation: Mask ventilation without difficulty Laryngoscope Size: Mac and 4 Grade View: Grade III Tube type: Oral Tube size: 7.5 mm Number of attempts: 1 Airway Equipment and Method: Bougie stylet Placement Confirmation: ETT inserted through vocal cords under direct vision,  positive ETCO2,  CO2 detector and breath sounds checked- equal and bilateral Secured at: 20 cm Tube secured with: Tape Dental Injury: Teeth and Oropharynx as per pre-operative assessment  Future Recommendations: Recommend- induction with short-acting agent, and alternative techniques readily available Comments: DL X 1 with noted Grade III view converted to Bougie style with positive equal breath sounds and ETCO2 post ETT placment. Airway as per pre-op post intubation.

## 2015-08-03 NOTE — Anesthesia Postprocedure Evaluation (Signed)
Anesthesia Post Note  Patient: Abigail Jackson  Procedure(s) Performed: Procedure(s) (LRB): ROBOTIC ASSISTED PARTIAL LEFT NEPHRECTOMY (Left)  Patient location during evaluation: PACU Anesthesia Type: General Level of consciousness: awake and alert Pain management: pain level controlled Vital Signs Assessment: post-procedure vital signs reviewed and stable Respiratory status: spontaneous breathing, nonlabored ventilation, respiratory function stable and patient connected to nasal cannula oxygen Cardiovascular status: blood pressure returned to baseline and stable Postop Assessment: no signs of nausea or vomiting Anesthetic complications: no    Last Vitals:  Filed Vitals:   08/03/15 0519 08/03/15 1237  BP: 118/65 123/70  Pulse: 94 95  Temp: 36.4 C 36.2 C  Resp: 18 17    Last Pain:  Filed Vitals:   08/03/15 1248  PainSc: 5                  Montez Hageman

## 2015-08-03 NOTE — Op Note (Signed)
Preoperative diagnosis: Left renal mass  Postoperative diagnosis: Left renal mass  Procedure:  1. Left robotic-assisted laparoscopic partial nephrectomy  Surgeon: Pryor Curia. M.D.  Resident: E. Harvel Ricks, MD  Assistant(s): Debbrah Alar, PA (in the absence of a qualified resident)  Anesthesia: General  Complications: None  EBL: 250 mL  IVF:  2000 mL crystalloid  Specimens: 1. Left renal mass  Disposition of specimens: Pathology  Intraoperative findings:       1. Warm renal ischemia time: 20 minutes  Drains: 1. # 15 Blake perinephric drain  Indication:  Abigail Jackson is a 49 y.o. year old patient with a left renal mass.  After a thorough review of the management options for their renal mass, they elected to proceed with surgical treatment and the above procedure.  We have discussed the potential benefits and risks of the procedure, side effects of the proposed treatment, the likelihood of the patient achieving the goals of the procedure, and any potential problems that might occur during the procedure or recuperation. Informed consent has been obtained.   Description of procedure:  The patient was taken to the operating room and a general anesthetic was administered. The patient was given preoperative antibiotics, placed in the left modified flank position with care to pad all potential pressure points, and prepped and draped in the usual sterile fashion. Next a preoperative timeout was performed.  A site was selected on the left side of the umbilicus for placement of the camera port. This was placed using a standard open Hassan technique which allowed entry into the peritoneal cavity under direct vision and without difficulty. A 12 mm laparoscopic assitant port was placed and a pneumoperitoneum established. The camera was then used to inspect the abdomen and there was no evidence of any intra-abdominal injuries or other abnormalities. The remaining abdominal  ports were then placed. 8 mm robotic ports were placed in the left upper quadrant, left mid-clavicular line, left lower quadrant, and far left lateral abdominal wall. All ports were placed under direct vision without difficulty. The surgical cart was then docked.   Utilizing the cautery scissors, the white line of Toldt was incised allowing the colon to be mobilized medially and the plane between the mesocolon and the anterior layer of Gerota's fascia to be developed and the kidney to be exposed.  The ureter and gonadal vein were identified inferiorly and the ureter was lifted anteriorly off the psoas muscle.  Dissection proceeded superiorly along the gonadal vein until the renal vein was identified.  The renal hilum was then carefully isolated with a combination of blunt and sharp dissection allowing the renal arterial and venous structures to be separated and isolated in preparation for renal hilar vessel clamping. 12.5 g of IV mannitol was then administered.   Attention turned to the kidney and the perinephric fat surrounding the renal mass was removed and the kidney was mobilized sufficiently for exposure and resection of the renal mass.   Once the renal mass was properly isolated, preparations were made for resection of the tumor.  Reconstructive sutures were placed into the abdomen for the renorrhaphy portion of the procedure.  The renal artery was then clamped with two bulldog clamps and the renal vein was clamped with one bulldog clamp.  The tumor was then excised with cold scissor dissection along with an adequate visible gross margin of normal renal parenchyma. The tumor appeared to be excised without any gross violation of the tumor. The renal collecting system was  not entered during removal of the tumor.  A running 3-0 V-lock suture was then brought through the capsule of the kidney and run along the base of the renal defect to provide hemostasis and close any entry into the renal collecting system  if present. Weck clips were used to secure this suture outside the renal capsule at the proximal and distal ends. An additional hemostatic agent (Surgiflo) was then placed into the renal defect. A running 2-0 V lock suture was then used to close the renal capsule using a sliding clip technique which resulted in excellent compression of the renal defect.    The bulldog clamps were then removed from the renal hilar vessel(s) and an additional 12.5 g of IV mannitol was administered. Total warm renal ischemia time was 20 minutes. The renal tumor resection site was examined. Hemostasis appeared adequate. We covered the kidney with Surgicel due to several small defects in the capsule from manipulation.  The kidney was placed back into its normal anatomic position and covered with perinephric fat.  A # 8 Blake drain was then brought through the lateral lower port site and positioned in the perinephric space.  It was secured to the skin with a nylon suture. The surgical cart was undocked.  The renal tumor specimen was removed intact within an endopouch retrieval bag via the camera port sites.  The 12 mm assist port site and extraction site was then closed at the fascial level with 0-vicryl suture.  All other robotic ports were removed under direct vision and the pneumoperitoneum let down with inspection of the operative field performed and hemostasis again confirmed. All incision sites were then injected with local anesthetic and reapproximated at the skin level with 4-0 monocryl subcuticular closures.  Dermabond was applied to the skin.  The patient tolerated the procedure well and without complications.  The patient was able to be extubated and transferred to the recovery unit in satisfactory condition.

## 2015-08-03 NOTE — Interval H&P Note (Signed)
History and Physical Interval Note:  08/03/2015 7:05 AM  Abigail Jackson  has presented today for surgery, with the diagnosis of LEFT RENAL NEOPLASM  The various methods of treatment have been discussed with the patient and family. After consideration of risks, benefits and other options for treatment, the patient has consented to  Procedure(s): ROBOTIC ASSISTED PARTIAL LEFT NEPHRECTOMY (Left) as a surgical intervention .  The patient's history has been reviewed, patient examined, no change in status, stable for surgery.  I have reviewed the patient's chart and labs.  Questions were answered to the patient's satisfaction.     Tylar Merendino,LES

## 2015-08-03 NOTE — Anesthesia Preprocedure Evaluation (Addendum)
Anesthesia Evaluation  Patient identified by MRN, date of birth, ID band Patient awake    Reviewed: Allergy & Precautions, NPO status , Patient's Chart, lab work & pertinent test results  Airway Mallampati: II  TM Distance: >3 FB Neck ROM: Full    Dental no notable dental hx.    Pulmonary neg pulmonary ROS,    Pulmonary exam normal breath sounds clear to auscultation       Cardiovascular hypertension, Pt. on medications negative cardio ROS Normal cardiovascular exam Rhythm:Regular Rate:Normal     Neuro/Psych negative neurological ROS  negative psych ROS   GI/Hepatic negative GI ROS, Neg liver ROS,   Endo/Other  negative endocrine ROSdiabetes, Type 2, Oral Hypoglycemic AgentsMorbid obesity  Renal/GU negative Renal ROS  negative genitourinary   Musculoskeletal negative musculoskeletal ROS (+)   Abdominal   Peds negative pediatric ROS (+)  Hematology negative hematology ROS (+)   Anesthesia Other Findings   Reproductive/Obstetrics negative OB ROS                            Anesthesia Physical Anesthesia Plan  ASA: III  Anesthesia Plan: General   Post-op Pain Management:    Induction: Intravenous  Airway Management Planned: Oral ETT  Additional Equipment:   Intra-op Plan:   Post-operative Plan: Extubation in OR  Informed Consent: I have reviewed the patients History and Physical, chart, labs and discussed the procedure including the risks, benefits and alternatives for the proposed anesthesia with the patient or authorized representative who has indicated his/her understanding and acceptance.   Dental advisory given  Plan Discussed with: CRNA  Anesthesia Plan Comments:         Anesthesia Quick Evaluation

## 2015-08-03 NOTE — Progress Notes (Signed)
Day of Surgery Subjective: Patient reports significant pain immediately post-operatively which was eventually controlled with 4 mg IV morphine. Now resting comfortably. No N/V, flatus/BMs. AF, VSS.   Objective: Vital signs in last 24 hours: Temp:  [97.1 F (36.2 C)-98.1 F (36.7 C)] 97.8 F (36.6 C) (12/08 1356) Pulse Rate:  [94-102] 96 (12/08 1356) Resp:  [13-23] 18 (12/08 1356) BP: (117-140)/(60-86) 140/86 mmHg (12/08 1356) SpO2:  [99 %-100 %] 100 % (12/08 1356) Weight:  [120.203 kg (265 lb)] 120.203 kg (265 lb) (12/08 0525)  Intake/Output from previous day:   Intake/Output this shift: Total I/O In: 2300 [I.V.:2300] Out: 750 [Urine:500; Drains:50; Blood:200]  Physical Exam:  General:alert, cooperative and appears stated age GI: soft, appropriately TTP, incisions C/D/I. JP with SS output.  Female genitalia: foley with yellow urine with small amount of debris Extremities: extremities normal, atraumatic, no cyanosis or edema  Lab Results:  Recent Labs  08/03/15 1233  HGB 12.0  HCT 37.1   BMET  Recent Labs  08/03/15 1233  NA 138  K 4.1  CL 100*  CO2 24  GLUCOSE 230*  BUN 12  CREATININE 1.63*  CALCIUM 9.3   No results for input(s): LABPT, INR in the last 72 hours. No results for input(s): LABURIN in the last 72 hours. No results found for this or any previous visit.  Studies/Results: No results found.  Assessment/Plan: Day of Surgery Procedure(s) (LRB): ROBOTIC ASSISTED PARTIAL LEFT NEPHRECTOMY (Left)   POD#0 robotic left partial nephrectomy. Recovering well. Post-op hemoglobin stable.   Clears  mIVF  Strict bedrest  PRN morphine IV, scheduled IV tylenol & PO oxycodone PRN  Foley to be removed tomorrow AM  F/u JP output  F/u UOP  Trend creatinine & hemoglobin  D/C tomorrow PMs Saturday AM   LOS: 0 days   Acie Fredrickson 08/03/2015, 5:04 PM

## 2015-08-03 NOTE — Transfer of Care (Signed)
Immediate Anesthesia Transfer of Care Note  Patient: Abigail Jackson  Procedure(s) Performed: Procedure(s): ROBOTIC ASSISTED PARTIAL LEFT NEPHRECTOMY (Left)  Patient Location: PACU  Anesthesia Type:General  Level of Consciousness:  sedated, patient cooperative and responds to stimulation  Airway & Oxygen Therapy:Patient Spontanous Breathing and Patient connected to face mask oxgen  Post-op Assessment:  Report given to PACU RN and Post -op Vital signs reviewed and stable  Post vital signs:  Reviewed and stable  Last Vitals:  Filed Vitals:   08/03/15 0519  BP: 118/65  Pulse: 94  Temp: 36.4 C  Resp: 18    Complications: No apparent anesthesia complications

## 2015-08-04 LAB — BASIC METABOLIC PANEL
Anion gap: 9 (ref 5–15)
BUN: 13 mg/dL (ref 6–20)
CO2: 23 mmol/L (ref 22–32)
Calcium: 8.7 mg/dL — ABNORMAL LOW (ref 8.9–10.3)
Chloride: 103 mmol/L (ref 101–111)
Creatinine, Ser: 1.8 mg/dL — ABNORMAL HIGH (ref 0.44–1.00)
GFR calc Af Amer: 36 mL/min — ABNORMAL LOW (ref >60)
GFR calc non Af Amer: 31 mL/min — ABNORMAL LOW (ref >60)
Glucose, Bld: 171 mg/dL — ABNORMAL HIGH (ref 65–99)
Potassium: 3.8 mmol/L (ref 3.5–5.1)
Sodium: 135 mmol/L (ref 135–145)

## 2015-08-04 LAB — GLUCOSE, CAPILLARY
Glucose-Capillary: 131 mg/dL — ABNORMAL HIGH (ref 65–99)
Glucose-Capillary: 143 mg/dL — ABNORMAL HIGH (ref 65–99)
Glucose-Capillary: 146 mg/dL — ABNORMAL HIGH (ref 65–99)
Glucose-Capillary: 147 mg/dL — ABNORMAL HIGH (ref 65–99)
Glucose-Capillary: 148 mg/dL — ABNORMAL HIGH (ref 65–99)
Glucose-Capillary: 154 mg/dL — ABNORMAL HIGH (ref 65–99)
Glucose-Capillary: 210 mg/dL — ABNORMAL HIGH (ref 65–99)

## 2015-08-04 LAB — CBC
HCT: 34.1 % — ABNORMAL LOW (ref 36.0–46.0)
Hemoglobin: 11.5 g/dL — ABNORMAL LOW (ref 12.0–15.0)
MCH: 29.3 pg (ref 26.0–34.0)
MCHC: 33.7 g/dL (ref 30.0–36.0)
MCV: 86.8 fL (ref 78.0–100.0)
Platelets: 209 10*3/uL (ref 150–400)
RBC: 3.93 MIL/uL (ref 3.87–5.11)
RDW: 13.7 % (ref 11.5–15.5)
WBC: 17.8 10*3/uL — ABNORMAL HIGH (ref 4.0–10.5)

## 2015-08-04 LAB — HEMOGLOBIN AND HEMATOCRIT, BLOOD
HCT: 35 % — ABNORMAL LOW (ref 36.0–46.0)
Hemoglobin: 11.2 g/dL — ABNORMAL LOW (ref 12.0–15.0)

## 2015-08-04 MED ORDER — OXYCODONE HCL 5 MG PO TABS
5.0000 mg | ORAL_TABLET | ORAL | Status: DC | PRN
  Administered 2015-08-04 (×2): 5 mg via ORAL
  Administered 2015-08-05 – 2015-08-07 (×9): 10 mg via ORAL
  Filled 2015-08-04: qty 1
  Filled 2015-08-04 (×9): qty 2
  Filled 2015-08-04: qty 1

## 2015-08-04 MED ORDER — BISACODYL 10 MG RE SUPP
10.0000 mg | Freq: Once | RECTAL | Status: AC
  Administered 2015-08-04: 10 mg via RECTAL
  Filled 2015-08-04: qty 1

## 2015-08-04 MED ORDER — SODIUM CHLORIDE 0.45 % IV SOLN
INTRAVENOUS | Status: DC
  Administered 2015-08-04 – 2015-08-05 (×2): via INTRAVENOUS

## 2015-08-04 MED ORDER — OXYCODONE HCL 5 MG PO TABS
5.0000 mg | ORAL_TABLET | Freq: Once | ORAL | Status: AC
  Administered 2015-08-04: 5 mg via ORAL
  Filled 2015-08-04: qty 1

## 2015-08-04 MED ORDER — HYDROCODONE-ACETAMINOPHEN 5-325 MG PO TABS
1.0000 | ORAL_TABLET | Freq: Four times a day (QID) | ORAL | Status: DC | PRN
  Administered 2015-08-04: 2 via ORAL
  Filled 2015-08-04: qty 2

## 2015-08-04 NOTE — Progress Notes (Signed)
1 Day Post-Op Subjective: Patient reports moderate pain control. No N/V. Passing flatus. No BMs. Tachycardic to 112 this morning, possible 2/2 pain. On bedrest. Tolerated clears.  Objective: Vital signs in last 24 hours: Temp:  [97.1 F (36.2 C)-99.6 F (37.6 C)] 98.4 F (36.9 C) (12/09 0436) Pulse Rate:  [95-112] 112 (12/09 0436) Resp:  [13-23] 18 (12/09 0436) BP: (117-140)/(60-86) 123/62 mmHg (12/09 0436) SpO2:  [96 %-100 %] 98 % (12/09 0436)  Intake/Output from previous day: 12/08 0701 - 12/09 0700 In: 3926.7 [P.O.:360; I.V.:3416.7; IV Piggyback:150] Out: 1420 [Urine:1125; Drains:95; Blood:200] Intake/Output this shift: Total I/O In: 800.4 [P.O.:240; I.V.:510.4; IV Piggyback:50] Out: 625 [Urine:625]  Physical Exam:  General:alert, cooperative and appears stated age GI: soft, appropriately TTP, incisions C/D/I. JP with SS output.  Female genitalia: foley with yellow urine. Extremities: extremities normal, atraumatic, no cyanosis or edema  Lab Results:  Recent Labs  08/03/15 1233 08/04/15 0527  HGB 12.0 11.2*  HCT 37.1 35.0*   BMET  Recent Labs  08/03/15 1233 08/04/15 0527  NA 138 135  K 4.1 3.8  CL 100* 103  CO2 24 23  GLUCOSE 230* 171*  BUN 12 13  CREATININE 1.63* 1.80*  CALCIUM 9.3 8.7*   No results for input(s): LABPT, INR in the last 72 hours. No results for input(s): LABURIN in the last 72 hours. No results found for this or any previous visit.  Studies/Results: No results found.  Assessment/Plan: 1 Day Post-Op Procedure(s) (LRB): ROBOTIC ASSISTED PARTIAL LEFT NEPHRECTOMY (Left)   POD#1 robotic left partial nephrectomy. Recovering well. Post-op hemoglobin stable. Creatinine trending up from baseline of 1.3 to 1.8 today.   Carb controlled diet  Decrease mIVF to 75  Ambulate, OOB to chair, IS  Encourage PO narcotics  PRN morphine IV, scheduled IV tylenol & PO oxycodone PRN  D/C foley  F/u TOV  F/u JP output  F/u UOP  Trend  creatinine & hemoglobin  D/C Saturday    LOS: 1 day   Acie Fredrickson 08/04/2015, 6:49 AM

## 2015-08-04 NOTE — Progress Notes (Signed)
1 Day Post-Op PM  Subjective: Patient reports moderate pain control. No N/V. Passing flatus & had a BM. Continued tachycardia upto 127, possibly 2/2 pain. Has ambulated 2x in halls. Tolerated regular diet. Foley out & voiding.  Objective: Vital signs in last 24 hours: Temp:  [97.8 F (36.6 C)-99.6 F (37.6 C)] 99.6 F (37.6 C) (12/09 1022) Pulse Rate:  [96-127] 127 (12/09 1022) Resp:  [18] 18 (12/09 0436) BP: (113-140)/(57-86) 113/57 mmHg (12/09 1022) SpO2:  [95 %-100 %] 95 % (12/09 1022)  Intake/Output from previous day: 12/08 0701 - 12/09 0700 In: 3926.7 [P.O.:360; I.V.:3416.7; IV Piggyback:150] Out: 1420 [Urine:1125; Drains:95; Blood:200] Intake/Output this shift: Total I/O In: 2050.4 [P.O.:840; I.V.:1010.4; IV Piggyback:200] Out: -   Physical Exam:  General:alert, cooperative and appears stated age GI: soft, appropriately TTP, incisions C/D/I. JP with SS output.  Female genitalia: foley out Extremities: extremities normal, atraumatic, no cyanosis or edema  Lab Results:  Recent Labs  08/03/15 1233 08/04/15 0527  HGB 12.0 11.2*  HCT 37.1 35.0*   BMET  Recent Labs  08/03/15 1233 08/04/15 0527  NA 138 135  K 4.1 3.8  CL 100* 103  CO2 24 23  GLUCOSE 230* 171*  BUN 12 13  CREATININE 1.63* 1.80*  CALCIUM 9.3 8.7*   No results for input(s): LABPT, INR in the last 72 hours. No results for input(s): LABURIN in the last 72 hours. No results found for this or any previous visit.  Studies/Results: No results found.  Assessment/Plan: 1 Day Post-Op Procedure(s) (LRB): ROBOTIC ASSISTED PARTIAL LEFT NEPHRECTOMY (Left)   POD#1 robotic left partial nephrectomy. Recovering well. Post-op hemoglobin stable. Creatinine trending up from baseline of 1.3 to 1.8 today.   Carb controlled diet  mIVF to 75  Ambulate, OOB to chair, IS  Encourage PO narcotics - increased from norco to oxycodone for improved PO pain control  PRN morphine IV, scheduled IV tylenol & PO  oxycodone PRN  F/u JP output  F/u UOP  Trend creatinine & hemoglobin  Possible D/C Saturday    LOS: 1 day   Acie Fredrickson 08/04/2015, 1:51 PM

## 2015-08-04 NOTE — Progress Notes (Signed)
Patient ID: Abigail Jackson, female   DOB: Nov 06, 1965, 49 y.o.   MRN: [id number]  1 Day Post-Op Subjective: Pt feeling good.  Ambulated well today.  Pain controlled.  She denies chest pain or shortness of breath.  Objective: Vital signs in last 24 hours: Temp:  [98.4 F (36.9 C)-99.9 F (37.7 C)] 99.9 F (37.7 C) (12/09 1402) Pulse Rate:  [101-127] 119 (12/09 1402) Resp:  [18-20] 20 (12/09 1402) BP: (113-137)/(57-77) 115/63 mmHg (12/09 1402) SpO2:  [95 %-99 %] 99 % (12/09 1402)  Intake/Output from previous day: 12/08 0701 - 12/09 0700 In: 3926.7 [P.O.:360; I.V.:3416.7; IV Piggyback:150] Out: 1420 [Urine:1125; Drains:95; Blood:200] Intake/Output this shift: Total I/O In: 3109.2 [P.O.:1080; I.V.:1829.2; IV Piggyback:200] Out: 310 [Urine:300; Drains:10]  Physical Exam:  General: Alert and oriented CV: Tachycardic, regular (HR 120) Lungs: Clear Abdomen: Soft, ND, positive BS Incisions: C/D/I Ext: NT, No erythema  Lab Results:  Recent Labs  08/03/15 1233 08/04/15 0527  HGB 12.0 11.2*  HCT 37.1 35.0*   BMET  Recent Labs  08/03/15 1233 08/04/15 0527  NA 138 135  K 4.1 3.8  CL 100* 103  CO2 24 23  GLUCOSE 230* 171*  BUN 12 13  CREATININE 1.63* 1.80*  CALCIUM 9.3 8.7*     Studies/Results: Path: pT1a Nx Mx, 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  Otherwise, may need further evaluation.  Currently, do not suspect pulmonary embolus but will have low threshold if tachycardia persists.   LOS: 1 day   Harith Mccadden,LES 08/04/2015, 6:24 PM

## 2015-08-05 ENCOUNTER — Inpatient Hospital Stay (HOSPITAL_COMMUNITY): Payer: Medicare Other

## 2015-08-05 LAB — GLUCOSE, CAPILLARY
Glucose-Capillary: 112 mg/dL — ABNORMAL HIGH (ref 65–99)
Glucose-Capillary: 143 mg/dL — ABNORMAL HIGH (ref 65–99)
Glucose-Capillary: 147 mg/dL — ABNORMAL HIGH (ref 65–99)
Glucose-Capillary: 147 mg/dL — ABNORMAL HIGH (ref 65–99)
Glucose-Capillary: 153 mg/dL — ABNORMAL HIGH (ref 65–99)
Glucose-Capillary: 166 mg/dL — ABNORMAL HIGH (ref 65–99)

## 2015-08-05 LAB — BASIC METABOLIC PANEL
Anion gap: 9 (ref 5–15)
Anion gap: 7 (ref 5–15)
BUN: 15 mg/dL (ref 6–20)
BUN: 15 mg/dL (ref 6–20)
CO2: 19 mmol/L — ABNORMAL LOW (ref 22–32)
CO2: 23 mmol/L (ref 22–32)
Calcium: 8.5 mg/dL — ABNORMAL LOW (ref 8.9–10.3)
Calcium: 8.3 mg/dL — ABNORMAL LOW (ref 8.9–10.3)
Chloride: 106 mmol/L (ref 101–111)
Chloride: 107 mmol/L (ref 101–111)
Creatinine, Ser: 2.06 mg/dL — ABNORMAL HIGH (ref 0.44–1.00)
Creatinine, Ser: 2.16 mg/dL — ABNORMAL HIGH (ref 0.44–1.00)
GFR calc Af Amer: 31 mL/min — ABNORMAL LOW (ref >60)
GFR calc Af Amer: 29 mL/min — ABNORMAL LOW (ref >60)
GFR calc non Af Amer: 27 mL/min — ABNORMAL LOW (ref >60)
GFR calc non Af Amer: 25 mL/min — ABNORMAL LOW (ref >60)
Glucose, Bld: 136 mg/dL — ABNORMAL HIGH (ref 65–99)
Glucose, Bld: 150 mg/dL — ABNORMAL HIGH (ref 65–99)
Potassium: 3.7 mmol/L (ref 3.5–5.1)
Potassium: 3.7 mmol/L (ref 3.5–5.1)
Sodium: 134 mmol/L — ABNORMAL LOW (ref 135–145)
Sodium: 137 mmol/L (ref 135–145)

## 2015-08-05 LAB — CBC WITH DIFFERENTIAL/PLATELET
Basophils Absolute: 0 10*3/uL (ref 0.0–0.1)
Basophils Relative: 0 %
Eosinophils Absolute: 0.1 10*3/uL (ref 0.0–0.7)
Eosinophils Relative: 1 %
HCT: 31.5 % — ABNORMAL LOW (ref 36.0–46.0)
Hemoglobin: 10 g/dL — ABNORMAL LOW (ref 12.0–15.0)
Lymphocytes Relative: 14 %
Lymphs Abs: 2 10*3/uL (ref 0.7–4.0)
MCH: 28.5 pg (ref 26.0–34.0)
MCHC: 31.7 g/dL (ref 30.0–36.0)
MCV: 89.7 fL (ref 78.0–100.0)
Monocytes Absolute: 1.3 10*3/uL — ABNORMAL HIGH (ref 0.1–1.0)
Monocytes Relative: 9 %
Neutro Abs: 10.7 10*3/uL — ABNORMAL HIGH (ref 1.7–7.7)
Neutrophils Relative %: 76 %
Platelets: 207 10*3/uL (ref 150–400)
RBC: 3.51 MIL/uL — ABNORMAL LOW (ref 3.87–5.11)
RDW: 13.9 % (ref 11.5–15.5)
WBC: 14.2 10*3/uL — ABNORMAL HIGH (ref 4.0–10.5)

## 2015-08-05 LAB — CREATININE, FLUID (PLEURAL, PERITONEAL, JP DRAINAGE): Creat, Fluid: 2.1 mg/dL

## 2015-08-05 LAB — HEMOGLOBIN AND HEMATOCRIT, BLOOD
HCT: 33.1 % — ABNORMAL LOW (ref 36.0–46.0)
Hemoglobin: 10.8 g/dL — ABNORMAL LOW (ref 12.0–15.0)

## 2015-08-05 MED ORDER — SODIUM CHLORIDE 0.9 % IV SOLN
1500.0000 mg | Freq: Every evening | INTRAVENOUS | Status: DC
  Administered 2015-08-05 – 2015-08-06 (×2): 1500 mg via INTRAVENOUS
  Filled 2015-08-05 (×2): qty 1500

## 2015-08-05 MED ORDER — SODIUM CHLORIDE 0.9 % IV BOLUS (SEPSIS)
1000.0000 mL | Freq: Once | INTRAVENOUS | Status: AC
  Administered 2015-08-05: 1000 mL via INTRAVENOUS

## 2015-08-05 MED ORDER — SODIUM CHLORIDE 0.9 % IV SOLN
INTRAVENOUS | Status: DC
  Administered 2015-08-05 – 2015-08-07 (×4): via INTRAVENOUS

## 2015-08-05 MED ORDER — PIPERACILLIN-TAZOBACTAM 3.375 G IVPB
3.3750 g | Freq: Three times a day (TID) | INTRAVENOUS | Status: DC
  Administered 2015-08-06 – 2015-08-07 (×5): 3.375 g via INTRAVENOUS
  Filled 2015-08-05 (×5): qty 50

## 2015-08-05 NOTE — Progress Notes (Signed)
Notified urologist MD on call for patient's VS (temp 100.7 and HR 130) and labs (Creatnine 2.16)  that resulted this afternoon. Patient is asymptomatic, new orders given. Will continue to monitor patient.

## 2015-08-05 NOTE — Progress Notes (Signed)
2 Days Post-Op Subjective: Patient reports no pain, no chest pain, no shortness of breath. Pt continues to be tachycardic with HR 116-127. Less than 1L UOP in 24 hours. Creatinine increase to 2.06 today from a baseline of 1-1.3.  Objective: Vital signs in last 24 hours: Temp:  [98.6 F (37 C)-100 F (37.8 C)] 98.6 F (37 C) (12/10 0517) Pulse Rate:  [116-127] 116 (12/10 0517) Resp:  [18-20] 18 (12/10 0517) BP: (108-130)/(56-63) 130/61 mmHg (12/10 0517) SpO2:  [94 %-99 %] 94 % (12/10 0517)  Intake/Output from previous day: 12/09 0701 - 12/10 0700 In: 3685.8 [P.O.:1080; I.V.:2405.8; IV Piggyback:200] Out: 14 [Urine:850; Drains:40] Intake/Output this shift:    Physical Exam:  General:alert, cooperative and appears stated age GI: soft, non tender, normal bowel sounds, no palpable masses, no organomegaly, no inguinal hernia Female genitalia: not done Extremities: extremities normal, atraumatic, no cyanosis or edema  Lab Results:  Recent Labs  08/04/15 0527 08/04/15 2001 08/05/15 0512  HGB 11.2* 11.5* 10.8*  HCT 35.0* 34.1* 33.1*   BMET  Recent Labs  08/04/15 0527 08/05/15 0512  NA 135 134*  K 3.8 3.7  CL 103 106  CO2 23 19*  GLUCOSE 171* 136*  BUN 13 15  CREATININE 1.80* 2.06*  CALCIUM 8.7* 8.5*   No results for input(s): LABPT, INR in the last 72 hours. No results for input(s): LABURIN in the last 72 hours. No results found for this or any previous visit.  Studies/Results: No results found.  Assessment/Plan: 49yo renal mass s/p robotic partial nephrectomy POD #2, tachycardic, dehydration  1. Continue cardiac monitoring 2. NS 1L now 3. Continue IVF at 125cc/hr 4. Possible discharge pending resolution of tachycardia   LOS: 2 days   Ura Yingling L 08/05/2015, 8:46 AM

## 2015-08-06 ENCOUNTER — Inpatient Hospital Stay (HOSPITAL_COMMUNITY): Payer: Medicare Other

## 2015-08-06 ENCOUNTER — Encounter (HOSPITAL_COMMUNITY): Payer: Self-pay | Admitting: Radiology

## 2015-08-06 DIAGNOSIS — R109 Unspecified abdominal pain: Secondary | ICD-10-CM | POA: Diagnosis present

## 2015-08-06 DIAGNOSIS — R000 Tachycardia, unspecified: Secondary | ICD-10-CM | POA: Diagnosis present

## 2015-08-06 DIAGNOSIS — D49519 Neoplasm of unspecified behavior of unspecified kidney: Secondary | ICD-10-CM

## 2015-08-06 DIAGNOSIS — G4733 Obstructive sleep apnea (adult) (pediatric): Secondary | ICD-10-CM | POA: Diagnosis present

## 2015-08-06 DIAGNOSIS — N179 Acute kidney failure, unspecified: Secondary | ICD-10-CM

## 2015-08-06 DIAGNOSIS — D72829 Elevated white blood cell count, unspecified: Secondary | ICD-10-CM | POA: Diagnosis present

## 2015-08-06 DIAGNOSIS — R918 Other nonspecific abnormal finding of lung field: Secondary | ICD-10-CM | POA: Diagnosis present

## 2015-08-06 LAB — CBC WITH DIFFERENTIAL/PLATELET
Basophils Absolute: 0 10*3/uL (ref 0.0–0.1)
Basophils Relative: 0 %
Eosinophils Absolute: 0.2 10*3/uL (ref 0.0–0.7)
Eosinophils Relative: 1 %
HCT: 32.9 % — ABNORMAL LOW (ref 36.0–46.0)
Hemoglobin: 10.3 g/dL — ABNORMAL LOW (ref 12.0–15.0)
Lymphocytes Relative: 13 %
Lymphs Abs: 1.6 10*3/uL (ref 0.7–4.0)
MCH: 28.3 pg (ref 26.0–34.0)
MCHC: 31.3 g/dL (ref 30.0–36.0)
MCV: 90.4 fL (ref 78.0–100.0)
Monocytes Absolute: 1 10*3/uL (ref 0.1–1.0)
Monocytes Relative: 8 %
Neutro Abs: 9.6 10*3/uL — ABNORMAL HIGH (ref 1.7–7.7)
Neutrophils Relative %: 78 %
Platelets: 233 10*3/uL (ref 150–400)
RBC: 3.64 MIL/uL — ABNORMAL LOW (ref 3.87–5.11)
RDW: 14.1 % (ref 11.5–15.5)
WBC: 12.4 10*3/uL — ABNORMAL HIGH (ref 4.0–10.5)

## 2015-08-06 LAB — HEMOGLOBIN AND HEMATOCRIT, BLOOD
HCT: 33.1 % — ABNORMAL LOW (ref 36.0–46.0)
Hemoglobin: 10.8 g/dL — ABNORMAL LOW (ref 12.0–15.0)

## 2015-08-06 LAB — BASIC METABOLIC PANEL
Anion gap: 9 (ref 5–15)
BUN: 15 mg/dL (ref 6–20)
CO2: 17 mmol/L — ABNORMAL LOW (ref 22–32)
Calcium: 8.5 mg/dL — ABNORMAL LOW (ref 8.9–10.3)
Chloride: 112 mmol/L — ABNORMAL HIGH (ref 101–111)
Creatinine, Ser: 1.97 mg/dL — ABNORMAL HIGH (ref 0.44–1.00)
GFR calc Af Amer: 33 mL/min — ABNORMAL LOW (ref >60)
GFR calc non Af Amer: 28 mL/min — ABNORMAL LOW (ref >60)
Glucose, Bld: 137 mg/dL — ABNORMAL HIGH (ref 65–99)
Potassium: 4.6 mmol/L (ref 3.5–5.1)
Sodium: 138 mmol/L (ref 135–145)

## 2015-08-06 LAB — GLUCOSE, CAPILLARY
Glucose-Capillary: 106 mg/dL — ABNORMAL HIGH (ref 65–99)
Glucose-Capillary: 117 mg/dL — ABNORMAL HIGH (ref 65–99)
Glucose-Capillary: 117 mg/dL — ABNORMAL HIGH (ref 65–99)
Glucose-Capillary: 126 mg/dL — ABNORMAL HIGH (ref 65–99)
Glucose-Capillary: 132 mg/dL — ABNORMAL HIGH (ref 65–99)
Glucose-Capillary: 136 mg/dL — ABNORMAL HIGH (ref 65–99)

## 2015-08-06 LAB — TSH: TSH: 2.547 u[IU]/mL (ref 0.350–4.500)

## 2015-08-06 LAB — LACTIC ACID, PLASMA: Lactic Acid, Venous: 1.2 mmol/L (ref 0.5–2.0)

## 2015-08-06 MED ORDER — ACETAMINOPHEN 325 MG PO TABS: 650.0000 mg | ORAL_TABLET | Freq: Four times a day (QID) | ORAL | Status: DC | PRN

## 2015-08-06 NOTE — Progress Notes (Signed)
3 Days Post-Op Subjective: Patient reports no pain, no chest pain, no shortness of breath. Pt continues to be tachycardic with HR 116-30. Good Urine output. CXR concerning for pneumonia  Objective: Vital signs in last 24 hours: Temp:  [98.2 F (36.8 C)-99.1 F (37.3 C)] 98.5 F (36.9 C) (12/11 1452) Pulse Rate:  [105-117] 105 (12/11 1452) Resp:  [20] 20 (12/11 1452) BP: (105-156)/(50-74) 156/71 mmHg (12/11 1452) SpO2:  [95 %-98 %] 98 % (12/11 1452)  Intake/Output from previous day: 12/10 0701 - 12/11 0700 In: 6274.6 [P.O.:960; I.V.:2714.6; IV Piggyback:2600] Out: 3305 [Urine:3250; Drains:55] Intake/Output this shift: Total I/O In: 290 [P.O.:240; IV Piggyback:50] Out: 1205 [Urine:1150; Drains:55]  Physical Exam:  General:alert, cooperative and appears stated age GI: soft, non tender, normal bowel sounds, no palpable masses, no organomegaly, no inguinal hernia Female genitalia: not done Extremities: extremities normal, atraumatic, no cyanosis or edema  Lab Results:  Recent Labs  08/05/15 2130 08/06/15 0500 08/06/15 1421  HGB 10.0* 10.8* 10.3*  HCT 31.5* 33.1* 32.9*   BMET  Recent Labs  08/05/15 1640 08/06/15 0653  NA 137 138  K 3.7 4.6  CL 107 112*  CO2 23 17*  GLUCOSE 150* 137*  BUN 15 15  CREATININE 2.16* 1.97*  CALCIUM 8.3* 8.5*   No results for input(s): LABPT, INR in the last 72 hours. No results for input(s): LABURIN in the last 72 hours. No results found for this or any previous visit.  Studies/Results: Dg Chest 2 View  08/05/2015  CLINICAL DATA:  Tachycardia. EXAM: CHEST  2 VIEW COMPARISON:  Chest radiograph of July 11, 2015. CT scan of July 18, 2015. FINDINGS: Stable cardiomediastinal silhouette. No pneumothorax is noted. Right lung is clear. Medial left basilar opacity is noted concerning for atelectasis or pneumonia. Minimal left pleural effusion is noted as well. Bony thorax is intact. IMPRESSION: Medial left basilar opacity concerning  for pneumonia or atelectasis. Minimal left pleural effusion is noted. Electronically Signed   By: Marijo Conception, M.D.   On: 08/05/2015 19:15   Dg Abd 1 View  08/06/2015  CLINICAL DATA:  Periumbilical region pain EXAM: ABDOMEN - 1 VIEW COMPARISON:  CT abdomen and pelvis May 31, 2015 FINDINGS: Upright image obtained. A catheter is seen overlying the lateral left upper quadrant. There is no bowel dilatation or air-fluid level suggesting obstruction. No free air evident. IMPRESSION: Unremarkable bowel gas pattern. No free air seen. Catheter overlies left upper quadrant. Electronically Signed   By: Lowella Grip III M.D.   On: 08/06/2015 13:23    Assessment/Plan: 49yo renal mass s/p robotic partial nephrectomy POD #3, tachycardic  1. Continue cardiac monitoring 2. Medical consult 3. Continue IVF at 125cc/hr 4. Continue broad spectrum antibiotics 5. Possible discharge pending resolution of tachycardia   LOS: 3 days   Tru Rana L 08/06/2015, 6:18 PM

## 2015-08-06 NOTE — Consult Note (Signed)
[facility] Initial Consultation Note   Patient Name: Abigail Jackson    [id number] PCP: Shade Flood, MD    DOB: 28-Nov-1965  DOA: 08/03/2015 DOS: the patient was seen and examined on 08/06/2015   Referring physician: Dr. Nicolette Bang Reason for consult: Sinus tachycardia medical management.  HPI: Abigail Jackson is a 49 y.o. female with Past medical history of hypertension, dyslipidemia, morbid obesity, sleep apnea clinically diagnosed, diabetes mellitus Patient is coming from home. The patient presented for elective left partial nephrectomy for suspected renal cell carcinoma. Patient tolerated the procedure well. After the procedure the patient remained tachycardic with heart rate ranging 120s to 130s. She was given IV hydration despite which her heart rate did not improve, her pain is well managed and the patient is ambulatory. Patient denied having any complaints of chest pain shortness of breath or dizziness or lightheadedness. She did have mild elevated temperature as well as leukocytosis for which she was started on vancomycin and Zosyn for suspected infection in the lung based on the chest x-ray.  At the time of my evaluation the patient denies having any complaints of dizziness or lightheadedness, palpitation, chest pain, shortness of breath, cough, fever, chills, leg swelling, leg cramps. She denies any diarrhea or constipation, she denies any active bleeding, she denies any burning urination or active bleeding in the urine. She also denies any nausea or vomiting. She complains of pain in the left flank which was present before the surgery and remains unchanged. She also complains of pain at the surgical site.  Review of Systems: as mentioned in the history of present illness.  A Comprehensive review of the other systems is negative.  Past Medical History  Diagnosis Date  . Hypertension   . Hyperlipidemia   . Heart murmur   . Arthritis   . Renal neoplasm       LEFT  . Irregular bowel habits   . Diabetes mellitus without complication [facility])    Past Surgical History  Procedure Laterality Date  . Shoulder surgery  2006    RIGHT  . Robotic assited partial nephrectomy Left 08/03/2015    Procedure: ROBOTIC ASSISTED PARTIAL LEFT NEPHRECTOMY;  Surgeon: Raynelle Bring, MD;  Location: [facility];  Service: Urology;  Laterality: Left;   Social History:  reports that she has never smoked. She does not have any smokeless tobacco history on file. She reports that she does not drink alcohol or use illicit drugs.  Allergies  Allergen Reactions  . Tramadol Itching    History reviewed. No pertinent family history.  Prior to Admission medications   Medication Sig Start Date End Date Taking? Authorizing Provider  amitriptyline (ELAVIL) 75 MG tablet Take 75 mg by mouth at bedtime.   Yes Historical Provider, MD  aspirin EC 81 MG tablet Take 81 mg by mouth daily.   Yes Historical Provider, MD  hydrochlorothiazide (HYDRODIURIL) 25 MG tablet Take 25 mg by mouth daily.   Yes Historical Provider, MD  linagliptin (TRADJENTA) 5 MG TABS tablet Take 5 mg by mouth daily.   Yes Historical Provider, MD  lisinopril (PRINIVIL,ZESTRIL) 40 MG tablet Take 40 mg by mouth daily.   Yes Historical Provider, MD  medroxyPROGESTERone (DEPO-PROVERA) 150 MG/ML injection Inject 150 mg into the muscle every 3 (three) months.   Yes Historical Provider, MD  metFORMIN (GLUCOPHAGE) 500 MG tablet Take by mouth 2 (two) times daily with a meal.   Yes Historical Provider, MD  omeprazole (PRILOSEC) 40 MG capsule Take 40  mg by mouth at bedtime.   Yes Historical Provider, MD  pravastatin (PRAVACHOL) 40 MG tablet Take 40 mg by mouth at bedtime.   Yes Historical Provider, MD  pregabalin (LYRICA) 75 MG capsule Take 75 mg by mouth 2 (two) times daily.   Yes Historical Provider, MD  HYDROcodone-acetaminophen (NORCO) 7.5-325 MG tablet Take 1 tablet by mouth every 6 (six) hours as needed for moderate pain.  08/03/15   Debbrah Alar, PA-C    Physical Exam: Filed Vitals:   08/05/15 1754 08/06/15 0400 08/06/15 1006 08/06/15 1452  BP: 136/65 126/74 105/50 156/71  Pulse: 130 115 117 105  Temp: 100.7 F (38.2 C) 99.1 F (37.3 C) 98.2 F (36.8 C) 98.5 F (36.9 C)  TempSrc: Oral Oral Oral Oral  Resp: 22 20 20 20   Height:      Weight:      SpO2: 95% 95% 96% 98%   General: Alert, Awake and Oriented to Time, Place and Person. Appear in mild distress Eyes: PERRL ENT: Oral Mucosa clear moist. Neck: Difficult to assess  JVD Cardiovascular: S1 and S2 Present, no Murmur, Peripheral Pulses Present Respiratory: Bilateral Air entry equal and Decreased,  Clear to Auscultation, no Crackles, no wheezes Abdomen: Bowel Sound present, Soft and left tenderness Skin: no Rash Extremities: no Pedal edema, no calf tenderness Neurologic: Grossly no focal neuro deficit.  Labs:  CBC:  Recent Labs Lab 07/31/15 1005  08/04/15 2001 08/05/15 0512 08/05/15 2130 08/06/15 0500 08/06/15 1421  WBC 9.5  --  17.8*  --  14.2*  --  12.4*  NEUTROABS  --   --   --   --  10.7*  --  9.6*  HGB 12.6  < > 11.5* 10.8* 10.0* 10.8* 10.3*  HCT 39.0  < > 34.1* 33.1* 31.5* 33.1* 32.9*  MCV 87.2  --  86.8  --  89.7  --  90.4  PLT 275  --  209  --  207  --  233  < > = values in this interval not displayed. Basic Metabolic Panel:  Recent Labs Lab 08/03/15 1233 08/04/15 0527 08/05/15 0512 08/05/15 1640 08/06/15 0653  NA 138 135 134* 137 138  K 4.1 3.8 3.7 3.7 4.6  CL 100* 103 106 107 112*  CO2 24 23 19* 23 17*  GLUCOSE 230* 171* 136* 150* 137*  BUN 12 13 15 15 15   CREATININE 1.63* 1.80* 2.06* 2.16* 1.97*  CALCIUM 9.3 8.7* 8.5* 8.3* 8.5*   CBG:  Recent Labs Lab 08/05/15 2037 08/06/15 0023 08/06/15 0435 08/06/15 0751 08/06/15 1141  GLUCAP 153* 132* 136* 117* 126*    Radiological Exams: Dg Chest 2 View  08/05/2015  CLINICAL DATA:  Tachycardia. EXAM: CHEST  2 VIEW COMPARISON:  Chest radiograph of July 11, 2015. CT scan of July 18, 2015. FINDINGS: Stable cardiomediastinal silhouette. No pneumothorax is noted. Right lung is clear. Medial left basilar opacity is noted concerning for atelectasis or pneumonia. Minimal left pleural effusion is noted as well. Bony thorax is intact. IMPRESSION: Medial left basilar opacity concerning for pneumonia or atelectasis. Minimal left pleural effusion is noted. Electronically Signed   By: Marijo Conception, M.D.   On: 08/05/2015 19:15   Dg Abd 1 View  08/06/2015  CLINICAL DATA:  Periumbilical region pain EXAM: ABDOMEN - 1 VIEW COMPARISON:  CT abdomen and pelvis May 31, 2015 FINDINGS: Upright image obtained. A catheter is seen overlying the lateral left upper quadrant. There is no bowel dilatation or air-fluid level  suggesting obstruction. No free air evident. IMPRESSION: Unremarkable bowel gas pattern. No free air seen. Catheter overlies left upper quadrant. Electronically Signed   By: Lowella Grip III M.D.   On: 08/06/2015 13:23    EKG: Independently reviewed. sinus tachycardia.  Assessment/Plan Principal Problem:   Renal neoplasm Active Problems:   Sinus tachycardia (HCC)   Left pulmonary infiltrate on CXR   Leucocytosis   Left flank pain   OSA (obstructive sleep apnea)   1.Sinus tachycardia,and possible healthcare associated pneumonia Continue antibiotics,  follow the culture, left-sided infiltrate appears most likely an atelectasis, continue incentive spirometry.  2. left flank pain Patient complains of significant abdominal tenderness on the left side X-ray has been ordered by the primary team Recommend CT abdomen without contrast given her renal function to identify any significant intra-abdominal pathology, in a patient recently had nephrectomy. Continue with Zosyn  3. suspected sleep apnea. Patient will benefit from outpatient workup regarding sleep apnea   4. pain management. Adding Tylenol, continue oxycodone and Lyrica and  amitriptyline  5.essential hypertension. Would discontinue lisinopril as well as hydrochlorothiazide while the patient's renal function recover.  6. acute kidney injury Baseline serum creatinine 1.3 prior to admission, worsened to 2.16, currently 1.97 Patient is urinating well Avoid nephrotoxic medication and continue hydration.  Family Communication: Family was at bedside all questions were answered satisfactorily   Primary team communication: discussed with Dr. Rosie Fate   Thank you very much for involving [place] in care of your patient.  We will continue to follow the patient.   Author: Berle Mull, MD [facility] Pager: [phone] 08/06/2015 3:59 PM    If 7PM-7AM, please contact night-coverage [email or web address] Password [email or web address]

## 2015-08-07 LAB — BASIC METABOLIC PANEL
Anion gap: 7 (ref 5–15)
BUN: 13 mg/dL (ref 6–20)
CO2: 20 mmol/L — ABNORMAL LOW (ref 22–32)
Calcium: 8.2 mg/dL — ABNORMAL LOW (ref 8.9–10.3)
Chloride: 107 mmol/L (ref 101–111)
Creatinine, Ser: 1.97 mg/dL — ABNORMAL HIGH (ref 0.44–1.00)
GFR calc Af Amer: 33 mL/min — ABNORMAL LOW (ref >60)
GFR calc non Af Amer: 28 mL/min — ABNORMAL LOW (ref >60)
Glucose, Bld: 129 mg/dL — ABNORMAL HIGH (ref 65–99)
Potassium: 3.6 mmol/L (ref 3.5–5.1)
Sodium: 134 mmol/L — ABNORMAL LOW (ref 135–145)

## 2015-08-07 LAB — URINE CULTURE: Culture: NO GROWTH

## 2015-08-07 LAB — GLUCOSE, CAPILLARY
Glucose-Capillary: 129 mg/dL — ABNORMAL HIGH (ref 65–99)
Glucose-Capillary: 112 mg/dL — ABNORMAL HIGH (ref 65–99)
Glucose-Capillary: 109 mg/dL — ABNORMAL HIGH (ref 65–99)
Glucose-Capillary: 109 mg/dL — ABNORMAL HIGH (ref 65–99)
Glucose-Capillary: 125 mg/dL — ABNORMAL HIGH (ref 65–99)
Glucose-Capillary: 142 mg/dL — ABNORMAL HIGH (ref 65–99)

## 2015-08-07 LAB — HEMOGLOBIN AND HEMATOCRIT, BLOOD
HCT: 30.7 % — ABNORMAL LOW (ref 36.0–46.0)
Hemoglobin: 9.7 g/dL — ABNORMAL LOW (ref 12.0–15.0)

## 2015-08-07 MED ORDER — METOPROLOL TARTRATE 25 MG PO TABS: 25.0000 mg | ORAL_TABLET | Freq: Two times a day (BID) | ORAL | Status: DC

## 2015-08-07 MED ORDER — FERROUS SULFATE 325 (65 FE) MG PO TABS
325.0000 mg | ORAL_TABLET | Freq: Every day | ORAL | Status: DC
  Administered 2015-08-07 – 2015-08-08 (×2): 325 mg via ORAL
  Filled 2015-08-07 (×2): qty 1

## 2015-08-07 MED ORDER — METOPROLOL SUCCINATE ER 50 MG PO TB24
50.0000 mg | ORAL_TABLET | Freq: Every day | ORAL | Status: DC
  Administered 2015-08-07: 50 mg via ORAL
  Filled 2015-08-07: qty 1

## 2015-08-07 MED ORDER — HYDROCODONE-ACETAMINOPHEN 5-325 MG PO TABS
1.0000 | ORAL_TABLET | Freq: Four times a day (QID) | ORAL | Status: DC | PRN
  Administered 2015-08-07 – 2015-08-08 (×3): 2 via ORAL
  Filled 2015-08-07 (×3): qty 2

## 2015-08-07 MED ORDER — METOPROLOL TARTRATE 50 MG PO TABS
50.0000 mg | ORAL_TABLET | Freq: Two times a day (BID) | ORAL | Status: DC
  Administered 2015-08-07: 50 mg via ORAL
  Filled 2015-08-07: qty 1

## 2015-08-07 NOTE — Progress Notes (Signed)
Patient ID: Abigail Jackson, female   DOB: 1966-04-18, 49 y.o.   MRN: [id number]  4 Days Post-Op Subjective: Pt with persistent sinus tachycardia over the weekend.  She has been completely asymptomatic with no chest pain, shortness of breath, dizziness, palpitations, etc.  Her ECG on Friday demonstrated sinus tachycardia. Hgb has been stable.  Chest imaging revealed atelectasis vs early infiltrate but CT scan demonstrated findings more consistent with atelectasis only.  Regardless, she was empirically started on antibiotic therapy for possible pneumonia. CT scan with no evidence of hematoma or urine leak. Drain Cr consistent with serum when checked on Friday.  IM consult obtained yesterday with recommendations made.  This morning, she continues to be asymptomatic with no new complaints or symptoms.  She has been having bowel movements, passing flatus, ambulating well, and tolerating her diet.   Objective: Vital signs in last 24 hours: Temp:  [98.2 F (36.8 C)-98.6 F (37 C)] 98.6 F (37 C) (12/12 0600) Pulse Rate:  [105-117] 117 (12/12 0600) Resp:  [20] 20 (12/12 0600) BP: (105-156)/(50-79) 131/79 mmHg (12/12 0600) SpO2:  [96 %-100 %] 100 % (12/12 0600)  Intake/Output from previous day: 12/11 0701 - 12/12 0700 In: 2290 [P.O.:240; I.V.:1500; IV Piggyback:550] Out: [id number] [Urine:2350; Drains:100] Intake/Output this shift:    Physical Exam:  General: Alert and oriented CV: Regular, tachycardic Lungs: Clear bilaterally Abdomen: Soft, ND, NT, Normal bowel sounds Incisions:C/D/I Ext: NT, No erythema  Lab Results:  Recent Labs  08/06/15 0500 08/06/15 1421 08/07/15 0404  HGB 10.8* 10.3* 9.7*  HCT 33.1* 32.9* 30.7*   Lab Results  Component Value Date   WBC 12.4* 08/06/2015   HGB 9.7* 08/07/2015   HCT 30.7* 08/07/2015   MCV 90.4 08/06/2015   PLT 233 08/06/2015     BMET  Recent Labs  08/06/15 0653 08/07/15 0404  NA 138 134*  K 4.6 3.6  CL 112* 107  CO2 17* 20*   GLUCOSE 137* 129*  BUN 15 13  CREATININE 1.97* 1.97*  CALCIUM 8.5* 8.2*     Studies/Results: Ct Abdomen Pelvis Wo Contrast  08/06/2015  CLINICAL DATA:  Left-sided abdominal pain status post partial left nephrectomy. EXAM: CT ABDOMEN AND PELVIS WITHOUT CONTRAST TECHNIQUE: Multidetector CT imaging of the abdomen and pelvis was performed following the standard protocol without IV contrast. COMPARISON:  None. FINDINGS: Mild degenerative disc disease is noted at L4-5. Mild bilateral posterior basilar subsegmental atelectasis is noted. Gallbladder is dilated, but no stones or evidence of inflammation is noted. No focal abnormality is noted in the liver, spleen or pancreas on these unenhanced images. Adrenal glands appear normal. Right kidney and ureter appear normal. Expected amount of postoperative fluid and stranding is noted in the left perinephric space consistent with a history of recent partial nephrectomy. Surgical drain is seen in this area as well. Atherosclerosis of abdominal aorta is noted without aneurysm formation. The appendix appears normal. There is no evidence of bowel obstruction. Mild urinary bladder distention is noted. Small calcified fibroids are noted in the uterus. Ovaries are unremarkable. No significant adenopathy is noted. IMPRESSION: Mild bilateral posterior basilar subsegmental atelectasis. Expected postoperative changes seen around the left kidney and left perinephric space consistent with history of recent partial nephrectomy. Surgical drain is noted. No hydronephrosis or renal obstruction is noted. Electronically Signed   By: Marijo Conception, M.D.   On: 08/06/2015 18:31   Dg Chest 2 View  08/05/2015  CLINICAL DATA:  Tachycardia. EXAM: CHEST  2 VIEW COMPARISON:  Chest  radiograph of July 11, 2015. CT scan of July 18, 2015. FINDINGS: Stable cardiomediastinal silhouette. No pneumothorax is noted. Right lung is clear. Medial left basilar opacity is noted concerning for  atelectasis or pneumonia. Minimal left pleural effusion is noted as well. Bony thorax is intact. IMPRESSION: Medial left basilar opacity concerning for pneumonia or atelectasis. Minimal left pleural effusion is noted. Electronically Signed   By: Marijo Conception, M.D.   On: 08/05/2015 19:15   Dg Abd 1 View  08/06/2015  CLINICAL DATA:  Periumbilical region pain EXAM: ABDOMEN - 1 VIEW COMPARISON:  CT abdomen and pelvis May 31, 2015 FINDINGS: Upright image obtained. A catheter is seen overlying the lateral left upper quadrant. There is no bowel dilatation or air-fluid level suggesting obstruction. No free air evident. IMPRESSION: Unremarkable bowel gas pattern. No free air seen. Catheter overlies left upper quadrant. Electronically Signed   By: Lowella Grip III M.D.   On: 08/06/2015 13:23    Assessment/Plan: POD # 4 s/p left robotic partial nephrectomy  1) Persistent asymptomatic tachycardia - Looking back at her vitals during her outpatient appointment and pre-op evaluation, her baseline HR is 95-100.  Her tachycardia may be multifactorial due to pain or other causes.  I still have low suspicion for pulmonary embolus with no other objective findings or symptoms to suggest PE.  Will discuss with IM whether evaluation for PE would be worthwhile.  Pt would need a VQ scan (rather than CT angiogram) considering her renal function and is not an ideal candidate for anticoagulation due recent partial nephrectomy.  Question whether she may benefit from beta blocker in the immediate postoperative period to reduce cardiac risk.  She continues on empiric therapy for possible pneumonia although imaging is very equivocal.  Will ask IM if continuing antibiotics is recommended.  2) AKI: Her renal function did worsen postoperative as expected following partial nephrectomy and has now stabilized.  Will continue to monitor.  3) Renal cell carcinoma: Pathology discussed with patient.  Prognosis is very good.  4)  Disposition: Will discuss with Internal Medicine consult team as to most appropriate plan pending further evaluation for tachycardia.   LOS: 4 days   Tanylah Schnoebelen,LES 08/07/2015, 7:15 AM

## 2015-08-07 NOTE — Care Management Important Message (Signed)
Important Message  Patient Details  Name: TALULA WISSEL MRN: [id number] Date of Birth: 11/25/1965   Medicare Important Message Given:  Yes    Camillo Flaming 08/07/2015, 1:45 PMImportant Message  Patient Details  Name: SHASTELYN PEMBLE MRN: [id number] Date of Birth: [birth year]   Medicare Important Message Given:  Yes    Camillo Flaming 08/07/2015, 1:45 PM

## 2015-08-07 NOTE — Progress Notes (Addendum)
[facility] Progress Note    Patient: Abigail Jackson    [id number]  DOB: Apr 25, 1966     DOA: 08/03/2015 Date of Service: the patient was seen and examined on 08/07/2015  Subjective: The patient continues to deny any acute complaint along left flank pain started after surgery. No nausea no vomiting no diarrhea no chest pain shortness of breath. After getting Lopressor the patient was dizzy and lightheaded. Nutrition: Able to tolerate oral diet Activity: Ambulating in the hallway Last BM: 08/05/2015  Assessment and Plan: 1. Sinus tachycardia. Her TSH is normal, telemetry shows sinus tachycardia without any other arrhythmia. Patient asymptomatic. No fever and CT scan of the abdomen shows lower left lung atelectasis without any consultation. Leukocytosis is expected change after the surgery. Improving rapidly.  At present I do not find any evidence of infection and therefore we will discontinue the antibiotics. Patient will continue using incentive spirometry for the atelectasis. Patient does not have any hypoxia, chest pain, shortness of breath or pedal edema to concern for pulmonary embolism at present.  Start the patient on Lopressor 50 mg twice a day for both blood pressure as well as tachycardia. Due to her dizziness after taking the medication. Orthostatic remains negative.  While her renal functions stabilizes would recommend to discontinue ACE inhibitor as well as hydrochlorothiazide and use Lopressor for blood pressure management. The patient can tolerate the medication without any dizziness, patient can be safely discharged.  Patient can follow-up with PCP as an outpatient to continue managing her Lopressor dose as well as follow-up on her renal function  2. left flank pain CT scan of the abdomen shows expected changes in a patient who recently had nephrectomy. Discontinue antibiotics  3. suspected sleep apnea. Patient will benefit from outpatient workup  regarding sleep apnea   4. pain management. Continue Tylenol, continue oxycodone and Lyrica and amitriptyline Patient tells me that her pain remains less than 4 out of 10 throughout and becomes negligible with medications.  5.essential hypertension. Continue with Lopressor 50 mg twice a day. Would discontinue lisinopril as well as hydrochlorothiazide while the patient's renal function recover.  6. acute kidney injury Baseline serum creatinine 1.3 prior to admission, worsened to 2.16, currently 1.97 Patient is urinating well Avoid nephrotoxic medication and continue oral hydration.  7. Postoperative anemia No evidence of bleeding on the CT scan Patient denies any active bleeding externally Patient may benefit from iron supplementation.  DVT Prophylaxis: SCD  Nutrition: Low salt diet Advance goals of care discussion: Full code  Brief Summary of Hospitalization:  HPI: As per the H and P dictated on initial consult note, "KONNER SHORES is a 49 y.o. female with Past medical history of hypertension, dyslipidemia, morbid obesity, sleep apnea clinically diagnosed, diabetes mellitus Patient is coming from home. The patient presented for elective left partial nephrectomy for suspected renal cell carcinoma. Patient tolerated the procedure well. After the procedure the patient remained tachycardic with heart rate ranging 120s to 130s. She was given IV hydration despite which her heart rate did not improve, her pain is well managed and the patient is ambulatory. Patient denied having any complaints of chest pain shortness of breath or dizziness or lightheadedness. She did have mild elevated temperature as well as leukocytosis for which she was started on vancomycin and Zosyn for suspected infection in the lung based on the chest x-ray. She complains of pain in the left flank which was present before the surgery and remains unchanged. She also complains of pain  at the surgical  site."  Antibiotics: Anti-infectives    Start     Dose/Rate Route Frequency Ordered Stop   08/05/15 2200  piperacillin-tazobactam (ZOSYN) IVPB 3.375 g  Status:  Discontinued     3.375 g 12.5 mL/hr over 240 Minutes Intravenous 3 times per day 08/05/15 2019 08/07/15 0808   08/05/15 2100  vancomycin (VANCOCIN) 1,500 mg in sodium chloride 0.9 % 500 mL IVPB  Status:  Discontinued     1,500 mg 250 mL/hr over 120 Minutes Intravenous Every evening 08/05/15 2019 08/07/15 0808   08/03/15 2000  ceFAZolin (ANCEF) IVPB 1 g/50 mL premix     1 g 100 mL/hr over 30 Minutes Intravenous Every 8 hours 08/03/15 1402 08/04/15 0510   08/03/15 0000  ceFAZolin (ANCEF) 3 g in dextrose 5 % 50 mL IVPB     3 g 160 mL/hr over 30 Minutes Intravenous 30 min pre-op 08/02/15 1342 08/03/15 1158       Family Communication: family was present at bedside, at the time of interview.  Opportunity was given to ask question and all questions were answered satisfactorily.   Disposition:  Barriers to safe discharge: Dizziness   Intake/Output Summary (Last 24 hours) at 08/07/15 1208 Last data filed at 08/07/15 [id number]  Gross per 24 hour  Intake   3770 ml  Output   2165 ml  Net   1605 ml   Filed Weights   08/03/15 0525  Weight: 120.203 kg (265 lb)    Objective: Physical Exam: Filed Vitals:   08/06/15 0400 08/06/15 1006 08/06/15 1452 08/07/15 0600  BP: 126/74 105/50 156/71 131/79  Pulse: 115 117 105 117  Temp: 99.1 F (37.3 C) 98.2 F (36.8 C) 98.5 F (36.9 C) 98.6 F (37 C)  TempSrc: Oral Oral Oral Oral  Resp: 20 20 20 20   Height:      Weight:      SpO2: 95% 96% 98% 100%     General: Appear in mild distress, NO Rash; Oral Mucosa moist. Cardiovascular: S1 and S2 Present, NO Murmur, NO JVD Respiratory: Bilateral Air entry present and Clear to Auscultation, NO Crackles, NO wheezes Abdomen: Bowel Sound present, Soft and no tenderness Extremities: no Pedal edema, no calf tenderness Neurology: Grossly no focal  neuro deficit.  Data Reviewed: CBC:  Recent Labs Lab 08/04/15 2001 08/05/15 0512 08/05/15 2130 08/06/15 0500 08/06/15 1421 08/07/15 0404  WBC 17.8*  --  14.2*  --  12.4*  --   NEUTROABS  --   --  10.7*  --  9.6*  --   HGB 11.5* 10.8* 10.0* 10.8* 10.3* 9.7*  HCT 34.1* 33.1* 31.5* 33.1* 32.9* 30.7*  MCV 86.8  --  89.7  --  90.4  --   PLT 209  --  207  --  233  --    Basic Metabolic Panel:  Recent Labs Lab 08/04/15 0527 08/05/15 0512 08/05/15 1640 08/06/15 0653 08/07/15 0404  NA 135 134* 137 138 134*  K 3.8 3.7 3.7 4.6 3.6  CL 103 106 107 112* 107  CO2 23 19* 23 17* 20*  GLUCOSE 171* 136* 150* 137* 129*  BUN 13 15 15 15 13   CREATININE 1.80* 2.06* 2.16* 1.97* 1.97*  CALCIUM 8.7* 8.5* 8.3* 8.5* 8.2*   Liver Function Tests: No results for input(s): AST, ALT, ALKPHOS, BILITOT, PROT, ALBUMIN in the last 168 hours. No results for input(s): LIPASE, AMYLASE in the last 168 hours. No results for input(s): AMMONIA in the last 168 hours.  Cardiac Enzymes: No results for input(s): CKTOTAL, CKMB, CKMBINDEX, TROPONINI in the last 168 hours. BNP (last 3 results) No results for input(s): BNP in the last 8760 hours.  ProBNP (last 3 results) No results for input(s): PROBNP in the last 8760 hours.   CBG:  Recent Labs Lab 08/06/15 2050 08/07/15 0107 08/07/15 0424 08/07/15 0729 08/07/15 1128  GLUCAP 117* 129* 112* 109* 109*    Recent Results (from the past 240 hour(s))  Culture, Urine     Status: None   Collection Time: 08/05/15  8:32 PM  Result Value Ref Range Status   Specimen Description URINE, CLEAN CATCH  Final   Special Requests NONE  Final   Culture   Final    NO GROWTH 1 DAY Performed at [facility]    Report Status 08/07/2015 FINAL  Final     Studies: Ct Abdomen Pelvis Wo Contrast  08/06/2015  CLINICAL DATA:  Left-sided abdominal pain status post partial left nephrectomy. EXAM: CT ABDOMEN AND PELVIS WITHOUT CONTRAST TECHNIQUE: Multidetector CT  imaging of the abdomen and pelvis was performed following the standard protocol without IV contrast. COMPARISON:  None. FINDINGS: Mild degenerative disc disease is noted at L4-5. Mild bilateral posterior basilar subsegmental atelectasis is noted. Gallbladder is dilated, but no stones or evidence of inflammation is noted. No focal abnormality is noted in the liver, spleen or pancreas on these unenhanced images. Adrenal glands appear normal. Right kidney and ureter appear normal. Expected amount of postoperative fluid and stranding is noted in the left perinephric space consistent with a history of recent partial nephrectomy. Surgical drain is seen in this area as well. Atherosclerosis of abdominal aorta is noted without aneurysm formation. The appendix appears normal. There is no evidence of bowel obstruction. Mild urinary bladder distention is noted. Small calcified fibroids are noted in the uterus. Ovaries are unremarkable. No significant adenopathy is noted. IMPRESSION: Mild bilateral posterior basilar subsegmental atelectasis. Expected postoperative changes seen around the left kidney and left perinephric space consistent with history of recent partial nephrectomy. Surgical drain is noted. No hydronephrosis or renal obstruction is noted. Electronically Signed   By: Marijo Conception, M.D.   On: 08/06/2015 18:31   Dg Abd 1 View  08/06/2015  CLINICAL DATA:  Periumbilical region pain EXAM: ABDOMEN - 1 VIEW COMPARISON:  CT abdomen and pelvis May 31, 2015 FINDINGS: Upright image obtained. A catheter is seen overlying the lateral left upper quadrant. There is no bowel dilatation or air-fluid level suggesting obstruction. No free air evident. IMPRESSION: Unremarkable bowel gas pattern. No free air seen. Catheter overlies left upper quadrant. Electronically Signed   By: Lowella Grip III M.D.   On: 08/06/2015 13:23     Scheduled Meds: . amitriptyline  75 mg Oral QHS  . docusate sodium  100 mg Oral BID  .  insulin aspart  0-15 Units Subcutaneous 6 times per day  . metoprolol tartrate  50 mg Oral BID  . pantoprazole  40 mg Oral Daily  . pravastatin  40 mg Oral QHS  . pregabalin  75 mg Oral BID   Continuous Infusions:  PRN Meds: acetaminophen, diphenhydrAMINE **OR** diphenhydrAMINE, HYDROcodone-acetaminophen, ondansetron  Time spent: 30 minutes  Author: Berle Mull, MD [facility] Pager: [phone] 08/07/2015 12:08 PM  If 7PM-7AM, please contact night-coverage at [email or web address], password [facility]

## 2015-08-07 NOTE — Progress Notes (Signed)
Patient ID: Abigail Jackson, female   DOB: 08/31/1965, 49 y.o.   MRN: [id number]  4 Days Post-Op PM  Subjective: Some improvement in tachycardia since starting lopressor this afternoon. Most recent HR was 107. Other VSS. Tolerating regular diet, ambulating, passing gas, having BMs. Reports 1-2x mild dizziness since starting lopressor. IM just switched her to Toprol XL which is a slower release and less likely to cause dizziness. Continues to deny chest pain, shortness of breath, palpitations. Abx stopped by IM.  Objective: Vital signs in last 24 hours: Temp:  [98.1 F (36.7 C)-98.6 F (37 C)] 98.1 F (36.7 C) (12/12 1344) Pulse Rate:  [107-117] 107 (12/12 1344) Resp:  [20] 20 (12/12 1344) BP: (131-136)/(77-79) 136/77 mmHg (12/12 1344) SpO2:  [100 %] 100 % (12/12 1344)  Intake/Output from previous day: 12/11 0701 - 12/12 0700 In: 3890 [P.O.:240; I.V.:3000; IV Piggyback:650] Out: 2450 [Urine:2350; Drains:100] Intake/Output this shift: Total I/O In: 240 [P.O.:240] Out: 820 [Urine:800; Drains:20]  Physical Exam:  General: Alert and oriented CV: Regular, tachycardic Lungs: Clear bilaterally Abdomen: Soft, ND, NT Incisions:C/D/I Ext: NT, No erythema  Lab Results:  Recent Labs  08/06/15 0500 08/06/15 1421 08/07/15 0404  HGB 10.8* 10.3* 9.7*  HCT 33.1* 32.9* 30.7*   Lab Results  Component Value Date   WBC 12.4* 08/06/2015   HGB 9.7* 08/07/2015   HCT 30.7* 08/07/2015   MCV 90.4 08/06/2015   PLT 233 08/06/2015     BMET  Recent Labs  08/06/15 0653 08/07/15 0404  NA 138 134*  K 4.6 3.6  CL 112* 107  CO2 17* 20*  GLUCOSE 137* 129*  BUN 15 13  CREATININE 1.97* 1.97*  CALCIUM 8.5* 8.2*     Studies/Results: Ct Abdomen Pelvis Wo Contrast  08/06/2015  CLINICAL DATA:  Left-sided abdominal pain status post partial left nephrectomy. EXAM: CT ABDOMEN AND PELVIS WITHOUT CONTRAST TECHNIQUE: Multidetector CT imaging of the abdomen and pelvis was performed following  the standard protocol without IV contrast. COMPARISON:  None. FINDINGS: Mild degenerative disc disease is noted at L4-5. Mild bilateral posterior basilar subsegmental atelectasis is noted. Gallbladder is dilated, but no stones or evidence of inflammation is noted. No focal abnormality is noted in the liver, spleen or pancreas on these unenhanced images. Adrenal glands appear normal. Right kidney and ureter appear normal. Expected amount of postoperative fluid and stranding is noted in the left perinephric space consistent with a history of recent partial nephrectomy. Surgical drain is seen in this area as well. Atherosclerosis of abdominal aorta is noted without aneurysm formation. The appendix appears normal. There is no evidence of bowel obstruction. Mild urinary bladder distention is noted. Small calcified fibroids are noted in the uterus. Ovaries are unremarkable. No significant adenopathy is noted. IMPRESSION: Mild bilateral posterior basilar subsegmental atelectasis. Expected postoperative changes seen around the left kidney and left perinephric space consistent with history of recent partial nephrectomy. Surgical drain is noted. No hydronephrosis or renal obstruction is noted. Electronically Signed   By: Marijo Conception, M.D.   On: 08/06/2015 18:31   Dg Chest 2 View  08/05/2015  CLINICAL DATA:  Tachycardia. EXAM: CHEST  2 VIEW COMPARISON:  Chest radiograph of July 11, 2015. CT scan of July 18, 2015. FINDINGS: Stable cardiomediastinal silhouette. No pneumothorax is noted. Right lung is clear. Medial left basilar opacity is noted concerning for atelectasis or pneumonia. Minimal left pleural effusion is noted as well. Bony thorax is intact. IMPRESSION: Medial left basilar opacity concerning for pneumonia or atelectasis.  Minimal left pleural effusion is noted. Electronically Signed   By: Marijo Conception, M.D.   On: 08/05/2015 19:15   Dg Abd 1 View  08/06/2015  CLINICAL DATA:  Periumbilical region  pain EXAM: ABDOMEN - 1 VIEW COMPARISON:  CT abdomen and pelvis May 31, 2015 FINDINGS: Upright image obtained. A catheter is seen overlying the lateral left upper quadrant. There is no bowel dilatation or air-fluid level suggesting obstruction. No free air evident. IMPRESSION: Unremarkable bowel gas pattern. No free air seen. Catheter overlies left upper quadrant. Electronically Signed   By: Lowella Grip III M.D.   On: 08/06/2015 13:23    Assessment/Plan: POD # 4 s/p left robotic partial nephrectomy  1) Persistent asymptomatic tachycardia - Looking back at her vitals during her outpatient appointment and pre-op evaluation, her baseline HR is 95-100.  Her tachycardia may be multifactorial due to pain or other causes.  I still have low suspicion for pulmonary embolus with no other objective findings or symptoms to suggest PE.  IM feels that the risk of PE is very low and did not recommend further work up of this. Also - CT consistent with atelectasis, not antibiotics, and as such antibiotics were discontinued today. IM recommend starting lopressor 50 mg BID today and stopping lisinopril. She had some mild dizziness with this and so she was changed to Toprol-XL this afternoon.  2) AKI: Her renal function did worsen postoperative as expected following partial nephrectomy and has now stabilized.  Will continue to monitor.  3) Renal cell carcinoma: Pathology discussed with patient.  Prognosis is very good.  4) Disposition: If tachycardia continues to improve with Toprol XL and she can tolerate this without dizziness she will meet all criteria for discharge tomorrow AM.   LOS: 4 days   Acie Fredrickson 08/07/2015, 5:15 PM

## 2015-08-08 LAB — BASIC METABOLIC PANEL
Anion gap: 6 (ref 5–15)
BUN: 15 mg/dL (ref 6–20)
CO2: 21 mmol/L — ABNORMAL LOW (ref 22–32)
Calcium: 8.7 mg/dL — ABNORMAL LOW (ref 8.9–10.3)
Chloride: 108 mmol/L (ref 101–111)
Creatinine, Ser: 1.82 mg/dL — ABNORMAL HIGH (ref 0.44–1.00)
GFR calc Af Amer: 36 mL/min — ABNORMAL LOW (ref >60)
GFR calc non Af Amer: 31 mL/min — ABNORMAL LOW (ref >60)
Glucose, Bld: 139 mg/dL — ABNORMAL HIGH (ref 65–99)
Potassium: 3.9 mmol/L (ref 3.5–5.1)
Sodium: 135 mmol/L (ref 135–145)

## 2015-08-08 LAB — GLUCOSE, CAPILLARY
Glucose-Capillary: 110 mg/dL — ABNORMAL HIGH (ref 65–99)
Glucose-Capillary: 114 mg/dL — ABNORMAL HIGH (ref 65–99)
Glucose-Capillary: 120 mg/dL — ABNORMAL HIGH (ref 65–99)

## 2015-08-08 MED ORDER — FERROUS SULFATE 325 (65 FE) MG PO TABS: 325.0000 mg | ORAL_TABLET | Freq: Every day | ORAL | Status: DC

## 2015-08-08 MED ORDER — METOPROLOL SUCCINATE ER 50 MG PO TB24: 50.0000 mg | ORAL_TABLET | Freq: Every day | ORAL | Status: DC

## 2015-08-08 NOTE — Discharge Instructions (Signed)
1.  Activity:  You are encouraged to ambulate frequently (about every hour during waking hours) to help prevent blood clots from forming in your legs or lungs.  However, you should not engage in any heavy lifting (> 10-15 lbs), strenuous activity, or straining. 2. Diet: You should advance your diet as instructed by your physician.  It will be normal to have some bloating, nausea, and abdominal discomfort intermittently. 3. Prescriptions:  You will be provided a prescription for pain medication to take as needed.  If your pain is not severe enough to require the prescription pain medication, you may take extra strength Tylenol instead which will have less side effects.  You should also take a prescribed stool softener to avoid straining with bowel movements as the prescription pain medication may constipate you. 4. Incisions: You may remove your dressing bandages 48 hours after surgery if not removed in the hospital.  You will either have some small staples or special tissue glue at each of the incision sites. Once the bandages are removed (if present), the incisions may stay open to air.  You may start showering (but not soaking or bathing in water) the 2nd day after surgery and the incisions simply need to be patted dry after the shower.  No additional care is needed. 5. What to call us about: You should call the office [phone]) if you develop fever > 101 or develop persistent vomiting.  Follow up with your primary care provider within 1 week to re-assess need for continued Toprol XL and to re-check your kidney function and blood counts (BMP and CBC) to determine when and if it is safe to restart Lisinopril and Hydrochlorothiazide. Also discuss with them if they would like to start you on a Iron Supplement for your blood count. This can cause constipation so wait to restart this until you are having regular bowel movements and no post-op constipation.  You may resume aspirin, advil, aleve, vitamins,  and supplements 7 days after surgery.

## 2015-08-08 NOTE — Discharge Summary (Signed)
Physician Discharge Summary  Patient ID: Abigail Jackson MRN: [id number] DOB/AGE: June 26, 1966 49 y.o.  Admit date: 08/03/2015 Discharge date: 08/08/2015  Admission Diagnoses: Renal mass  Discharge Diagnoses:  Principal Problem:   Renal neoplasm Active Problems:   Sinus tachycardia ([employer])   Left pulmonary infiltrate on CXR   Leucocytosis   Left flank pain   OSA (obstructive sleep apnea)   Discharged Condition: good  Hospital Course:  Ms. Bellemare was taken to the operating room on 08/03/15 and underwent a robotic assisted laparoscopic left partial nephrectomy. She tolerated this procedure well and without complications. Postoperatively, she was able to be transferred to a regular hospital room following recovery from anesthesia.  She was on bedrest the evening of surgery. She was able to begin ambulating the on POD#1 and her foley catheter was removed and she passed her TOV. Her JP output remained low but was sent for JP Cr on POD#1 and was equal to serum and her JP drain was removed. Her creatinine rose slowly post-operatively and peaked at 2.16 on POD#2. This stabilized and then down-trended to 1.8 at discharge.  She was noted to have aymptomatic sinus tachycardia immediately post-operatively as high as the 120s which did not improve with better pain control. Otherwise, she remained hemodynamically stable. Internal medicine was consulted on POD#3 for the persistent tachycardia despite good pain control and leukocytosis who recommended an EKG which confirmed sinus tachcyardia and a CT A/P which was unremarkable. There was concern for a possible infiltrate on the lower lung cuts of the CT. She was placed on vancomycin and zosyn due to concern for hospital acquired pneumonia. On POD#4 Internal medicine felt the CT was more consistent with atelectasis and leukocytosis consistent with her recent surgery and stopped the antibiotics and placed her on Lopressor for her sinus tachycardia. This caused  some dizziness so the afternoon of POD#4 she was switched to Toprol XL with resolution of her sinus tachycardia and no additional dizziness. Internal medicine felt that she was very low risk for PE and did not recommend work up for PE. Throughout her course she had excellent urine output.  She was transitioned to oral pain medication, tolerated a regular diet, was ambulating in the halls, voiding spontaneously, passing flatus, had bowel movements and had met all discharge criteria and was able to be discharged home on POD#5.  Consults: Internal Medicine  Significant Diagnostic Studies: labs: JP creatinine 2.1  CT A/P, 08/06/15: IMPRESSION: Mild bilateral posterior basilar subsegmental atelectasis.  Expected postoperative changes seen around the left kidney and left perinephric space consistent with history of recent partial nephrectomy. Surgical drain is noted.  No hydronephrosis or renal obstruction is noted.  EKG, 08/04/15: Sinus tachycardia  Treatments: surgery: robotic assisted laparoscopic left partial nephrectomy, 08/03/15  Discharge Exam: Blood pressure 141/99, pulse 99, temperature 98.4 F (36.9 C), temperature source Oral, resp. rate 20, height '5\' 3"'  (1.6 m), weight 120.203 kg (265 lb), SpO2 97 %. General: Alert and oriented CV: Regular rate & rhythm Lungs: Clear bilaterally Abdomen: Soft, ND, NT Incisions:C/D/I Ext: NT, No erythema  Disposition: Final discharge disposition not confirmed     Medication List    STOP taking these medications        aspirin EC 81 MG tablet     hydrochlorothiazide 25 MG tablet  Commonly known as:  HYDRODIURIL     lisinopril 40 MG tablet  Commonly known as:  PRINIVIL,ZESTRIL      TAKE these medications  amitriptyline 75 MG tablet  Commonly known as:  ELAVIL  Take 75 mg by mouth at bedtime.     ferrous sulfate 325 (65 FE) MG tablet  Take 1 tablet (325 mg total) by mouth daily with breakfast.     HYDROcodone-acetaminophen  7.5-325 MG tablet  Commonly known as:  NORCO  Take 1 tablet by mouth every 6 (six) hours as needed for moderate pain.     linagliptin 5 MG Tabs tablet  Commonly known as:  TRADJENTA  Take 5 mg by mouth daily.     medroxyPROGESTERone 150 MG/ML injection  Commonly known as:  DEPO-PROVERA  Inject 150 mg into the muscle every 3 (three) months.     metFORMIN 500 MG tablet  Commonly known as:  GLUCOPHAGE  Take by mouth 2 (two) times daily with a meal.     metoprolol succinate 50 MG 24 hr tablet  Commonly known as:  TOPROL-XL  Take 1 tablet (50 mg total) by mouth daily. Take with or immediately following a meal.     omeprazole 40 MG capsule  Commonly known as:  PRILOSEC  Take 40 mg by mouth at bedtime.     pravastatin 40 MG tablet  Commonly known as:  PRAVACHOL  Take 40 mg by mouth at bedtime.     pregabalin 75 MG capsule  Commonly known as:  LYRICA  Take 75 mg by mouth 2 (two) times daily.           Follow-up Information    Follow up with BORDEN,LES, MD On 08/29/2015.   Specialty:  Urology   Why:  at 10:45   Contact information:   [address] [place] [address] [phone]       Signed: Acie Fredrickson 08/08/2015, 7:13 AM

## 2015-08-08 NOTE — Progress Notes (Signed)
Patient ID: Abigail Jackson, female   DOB: 02-Nov-1965, 49 y.o.   MRN: [id number]  5 Days Post-Op PM  Subjective: Tachycardia resolved overnight after switching to Toprol XL. She denies any significant dizziness with the Toprol XL, including with ambulation. Most recent HR was 99. Other VSS. Tolerating regular diet, ambulating, passing gas, having BMs.   Objective: Vital signs in last 24 hours: Temp:  [98.1 F (36.7 C)-98.4 F (36.9 C)] 98.4 F (36.9 C) (12/13 0455) Pulse Rate:  [98-108] 99 (12/13 0455) Resp:  [18-20] 20 (12/13 0455) BP: (136-141)/(76-99) 141/99 mmHg (12/13 0455) SpO2:  [97 %-100 %] 97 % (12/13 0455)  Intake/Output from previous day: 12/12 0701 - 12/13 0700 In: 840 [P.O.:840] Out: 2020 [Urine:2000; Drains:20] Intake/Output this shift: Total I/O In: 480 [P.O.:480] Out: 750 [Urine:750]  Physical Exam:  General: Alert and oriented CV: Regular rate & rhythm Lungs: Clear bilaterally Abdomen: Soft, ND, NT Incisions:C/D/I Ext: NT, No erythema  Lab Results:  Recent Labs  08/06/15 0500 08/06/15 1421 08/07/15 0404  HGB 10.8* 10.3* 9.7*  HCT 33.1* 32.9* 30.7*   Lab Results  Component Value Date   WBC 12.4* 08/06/2015   HGB 9.7* 08/07/2015   HCT 30.7* 08/07/2015   MCV 90.4 08/06/2015   PLT 233 08/06/2015     BMET  Recent Labs  08/07/15 0404 08/08/15 0431  NA 134* 135  K 3.6 3.9  CL 107 108  CO2 20* 21*  GLUCOSE 129* 139*  BUN 13 15  CREATININE 1.97* 1.82*  CALCIUM 8.2* 8.7*     Studies/Results: Ct Abdomen Pelvis Wo Contrast  08/06/2015  CLINICAL DATA:  Left-sided abdominal pain status post partial left nephrectomy. EXAM: CT ABDOMEN AND PELVIS WITHOUT CONTRAST TECHNIQUE: Multidetector CT imaging of the abdomen and pelvis was performed following the standard protocol without IV contrast. COMPARISON:  None. FINDINGS: Mild degenerative disc disease is noted at L4-5. Mild bilateral posterior basilar subsegmental atelectasis is noted.  Gallbladder is dilated, but no stones or evidence of inflammation is noted. No focal abnormality is noted in the liver, spleen or pancreas on these unenhanced images. Adrenal glands appear normal. Right kidney and ureter appear normal. Expected amount of postoperative fluid and stranding is noted in the left perinephric space consistent with a history of recent partial nephrectomy. Surgical drain is seen in this area as well. Atherosclerosis of abdominal aorta is noted without aneurysm formation. The appendix appears normal. There is no evidence of bowel obstruction. Mild urinary bladder distention is noted. Small calcified fibroids are noted in the uterus. Ovaries are unremarkable. No significant adenopathy is noted. IMPRESSION: Mild bilateral posterior basilar subsegmental atelectasis. Expected postoperative changes seen around the left kidney and left perinephric space consistent with history of recent partial nephrectomy. Surgical drain is noted. No hydronephrosis or renal obstruction is noted. Electronically Signed   By: Marijo Conception, M.D.   On: 08/06/2015 18:31   Dg Abd 1 View  08/06/2015  CLINICAL DATA:  Periumbilical region pain EXAM: ABDOMEN - 1 VIEW COMPARISON:  CT abdomen and pelvis May 31, 2015 FINDINGS: Upright image obtained. A catheter is seen overlying the lateral left upper quadrant. There is no bowel dilatation or air-fluid level suggesting obstruction. No free air evident. IMPRESSION: Unremarkable bowel gas pattern. No free air seen. Catheter overlies left upper quadrant. Electronically Signed   By: Lowella Grip III M.D.   On: 08/06/2015 13:23    Assessment/Plan: POD # 5 s/p left robotic partial nephrectomy  1) Persistent asymptomatic tachycardia -  returned to her baseline level (95-100) with Toprol XL 50 mg daily with no dizziness over past >12 hours.  2) AKI: Her renal function did worsen postoperative as expected following partial nephrectomy and has now stabilized and  improved slightly today (1.8).    3) Renal cell carcinoma: Pathology discussed with patient.  Prognosis is very good.  4) Disposition: Now that tachycardia has resolved with Toprol XL and she can tolerate this without dizziness she has meet all criteria for discharge and can discharge this AM.   LOS: 5 days   Acie Fredrickson 08/08/2015, 6:43 AM

## 2015-08-08 NOTE — Progress Notes (Signed)
[facility] Progress Note    Patient: Abigail Jackson    [id number]  DOB: 1965/11/23     DOA: 08/03/2015 Date of Service: the patient was seen and examined on 08/08/2015  Subjective: no acute complain, pain has been well controlled, tachycardia resolved. Nutrition: able to tolerate oral diwet Activity: as tolerated ambulating in hallway  Assessment and Plan: 1. Sinus tachycardia. Her TSH is normal, telemetry shows sinus tachycardia without any other arrhythmia. Patient asymptomatic. No fever and CT scan of the abdomen shows lower left lung atelectasis without any consultation. Leukocytosis is expected change after the surgery. Improving rapidly.  At present I do not find any evidence of infection. Patient will continue using incentive spirometry for the atelectasis. Patient does not have any hypoxia, chest pain, shortness of breath or pedal edema to concern for pulmonary embolism at present.  Continue toprol xl, follow up with PCP in 1week with repeat cbc and BMP.  Orthostatic remains negative.  While her renal functions stabilizes would recommend to discontinue ACE inhibitor as well as hydrochlorothiazide and use METOPROLOL for blood pressure management.  2. left flank pain CT scan of the abdomen shows expected changes in a patient who recently had nephrectomy.  3. suspected sleep apnea. Patient will benefit from outpatient workup regarding sleep apnea   4. pain management. Continue Tylenol, continue oxycodone and Lyrica and amitriptyline Patient tells me that her pain remains less than 4 out of 10 throughout and becomes negligible with medications.  5.essential hypertension. Continue with TOPROL XL 50 mg ONCE a day. Would discontinue lisinopril as well as hydrochlorothiazide while the patient's renal function recover.  6. acute kidney injury Baseline serum creatinine 1.3 prior to admission, worsened to 2.16, currently 1.8 Patient is urinating well Avoid  nephrotoxic medication and continue oral hydration.  7. Postoperative anemia No evidence of bleeding on the CT scan Patient denies any active bleeding externally Patient WILL benefit from iron supplementation.  Advance goals of care discussion: FULL  Antibiotics: Anti-infectives    Start     Dose/Rate Route Frequency Ordered Stop   08/05/15 2200  piperacillin-tazobactam (ZOSYN) IVPB 3.375 g  Status:  Discontinued     3.375 g 12.5 mL/hr over 240 Minutes Intravenous 3 times per day 08/05/15 2019 08/07/15 0808   08/05/15 2100  vancomycin (VANCOCIN) 1,500 mg in sodium chloride 0.9 % 500 mL IVPB  Status:  Discontinued     1,500 mg 250 mL/hr over 120 Minutes Intravenous Every evening 08/05/15 2019 08/07/15 0808   08/03/15 2000  ceFAZolin (ANCEF) IVPB 1 g/50 mL premix     1 g 100 mL/hr over 30 Minutes Intravenous Every 8 hours 08/03/15 1402 08/04/15 0510   08/03/15 0000  ceFAZolin (ANCEF) 3 g in dextrose 5 % 50 mL IVPB     3 g 160 mL/hr over 30 Minutes Intravenous 30 min pre-op 08/02/15 1342 08/03/15 1158      Family Communication: family was present at bedside, at the time of interview.  Opportunity was given to ask question and all questions were answered satisfactorily.   Disposition:  Expected discharge date:08/08/2015  Barriers to safe discharge: NONE   Intake/Output Summary (Last 24 hours) at 08/08/15 0847 Last data filed at 08/08/15 0502  Gross per 24 hour  Intake    720 ml  Output   2020 ml  Net  -1300 ml   Filed Weights   08/03/15 0525  Weight: 120.203 kg (265 lb)    Objective: Physical Exam: Filed Vitals:  08/07/15 1344 08/07/15 1809 08/07/15 2032 08/08/15 0455  BP: 136/77  136/76 141/99  Pulse: 107 108 98 99  Temp: 98.1 F (36.7 C)  98.1 F (36.7 C) 98.4 F (36.9 C)  TempSrc: Axillary  Oral Oral  Resp: 20  18 20   Height:      Weight:      SpO2: 100%  98% 97%     General: Appear in mild distress, no Rash; Oral Mucosa moist. Cardiovascular: S1 and S2  Present, no Murmur, no JVD Respiratory: Bilateral Air entry present and Clear to Auscultation, no Crackles, no wheezes Abdomen: Bowel Sound present, Soft and no tenderness Extremities: no Pedal edema, no calf tenderness Neurology: Grossly no focal neuro deficit.  Data Reviewed: CBC:  Recent Labs Lab 08/04/15 2001 08/05/15 0512 08/05/15 2130 08/06/15 0500 08/06/15 1421 08/07/15 0404  WBC 17.8*  --  14.2*  --  12.4*  --   NEUTROABS  --   --  10.7*  --  9.6*  --   HGB 11.5* 10.8* 10.0* 10.8* 10.3* 9.7*  HCT 34.1* 33.1* 31.5* 33.1* 32.9* 30.7*  MCV 86.8  --  89.7  --  90.4  --   PLT 209  --  207  --  233  --    Basic Metabolic Panel:  Recent Labs Lab 08/05/15 0512 08/05/15 1640 08/06/15 0653 08/07/15 0404 08/08/15 0431  NA 134* 137 138 134* 135  K 3.7 3.7 4.6 3.6 3.9  CL 106 107 112* 107 108  CO2 19* 23 17* 20* 21*  GLUCOSE 136* 150* 137* 129* 139*  BUN 15 15 15 13 15   CREATININE 2.06* 2.16* 1.97* 1.97* 1.82*  CALCIUM 8.5* 8.3* 8.5* 8.2* 8.7*   Liver Function Tests: No results for input(s): AST, ALT, ALKPHOS, BILITOT, PROT, ALBUMIN in the last 168 hours. No results for input(s): LIPASE, AMYLASE in the last 168 hours. No results for input(s): AMMONIA in the last 168 hours.  Cardiac Enzymes: No results for input(s): CKTOTAL, CKMB, CKMBINDEX, TROPONINI in the last 168 hours. BNP (last 3 results) No results for input(s): BNP in the last 8760 hours.  ProBNP (last 3 results) No results for input(s): PROBNP in the last 8760 hours.   CBG:  Recent Labs Lab 08/07/15 1549 08/07/15 2024 08/08/15 08/08/15 0452 08/08/15 0754  GLUCAP 125* 142* 114* 120* 110*    Recent Results (from the past 240 hour(s))  Culture, Urine     Status: None   Collection Time: 08/05/15  8:32 PM  Result Value Ref Range Status   Specimen Description URINE, CLEAN CATCH  Final   Special Requests NONE  Final   Culture   Final    NO GROWTH 1 DAY Performed at [facility]    Report  Status 08/07/2015 FINAL  Final  Culture, blood (routine x 2)     Status: None (Preliminary result)   Collection Time: 08/05/15  9:30 PM  Result Value Ref Range Status   Specimen Description BLOOD LEFT ARM  Final   Special Requests BOTTLES DRAWN AEROBIC AND ANAEROBIC 10CC  Final   Culture   Final    NO GROWTH 1 DAY Performed at [facility]    Report Status PENDING  Incomplete  Culture, blood (routine x 2)     Status: None (Preliminary result)   Collection Time: 08/05/15  9:35 PM  Result Value Ref Range Status   Specimen Description BLOOD RIGHT ARM  Final   Special Requests BOTTLES DRAWN AEROBIC AND ANAEROBIC 10CC  Final   Culture   Final    NO GROWTH 1 DAY Performed at [facility]    Report Status PENDING  Incomplete     Studies: No results found.   Scheduled Meds: . amitriptyline  75 mg Oral QHS  . docusate sodium  100 mg Oral BID  . ferrous sulfate  325 mg Oral Q breakfast  . insulin aspart  0-15 Units Subcutaneous 6 times per day  . metoprolol succinate  50 mg Oral Daily  . pantoprazole  40 mg Oral Daily  . pravastatin  40 mg Oral QHS  . pregabalin  75 mg Oral BID   Continuous Infusions:  PRN Meds: acetaminophen, diphenhydrAMINE **OR** diphenhydrAMINE, HYDROcodone-acetaminophen, ondansetron  Time spent: 25 minutes  Author: Berle Mull, MD [facility] Pager: [phone] 08/08/2015 8:47 AM  If 7PM-7AM, please contact night-coverage at [email or web address], password [facility]

## 2015-08-11 LAB — CULTURE, BLOOD (ROUTINE X 2)
Culture: NO GROWTH
Culture: NO GROWTH

## 2017-06-04 IMAGING — DX DG CHEST 2V
2 series · 2 of 2 positions shown · non-contrast
Comparison: None.

CLINICAL DATA: Neoplasm of left kidney. No chest complaints.
History of hypertension. Initial encounter.

EXAM:
CHEST  2 VIEW

[chest pa]
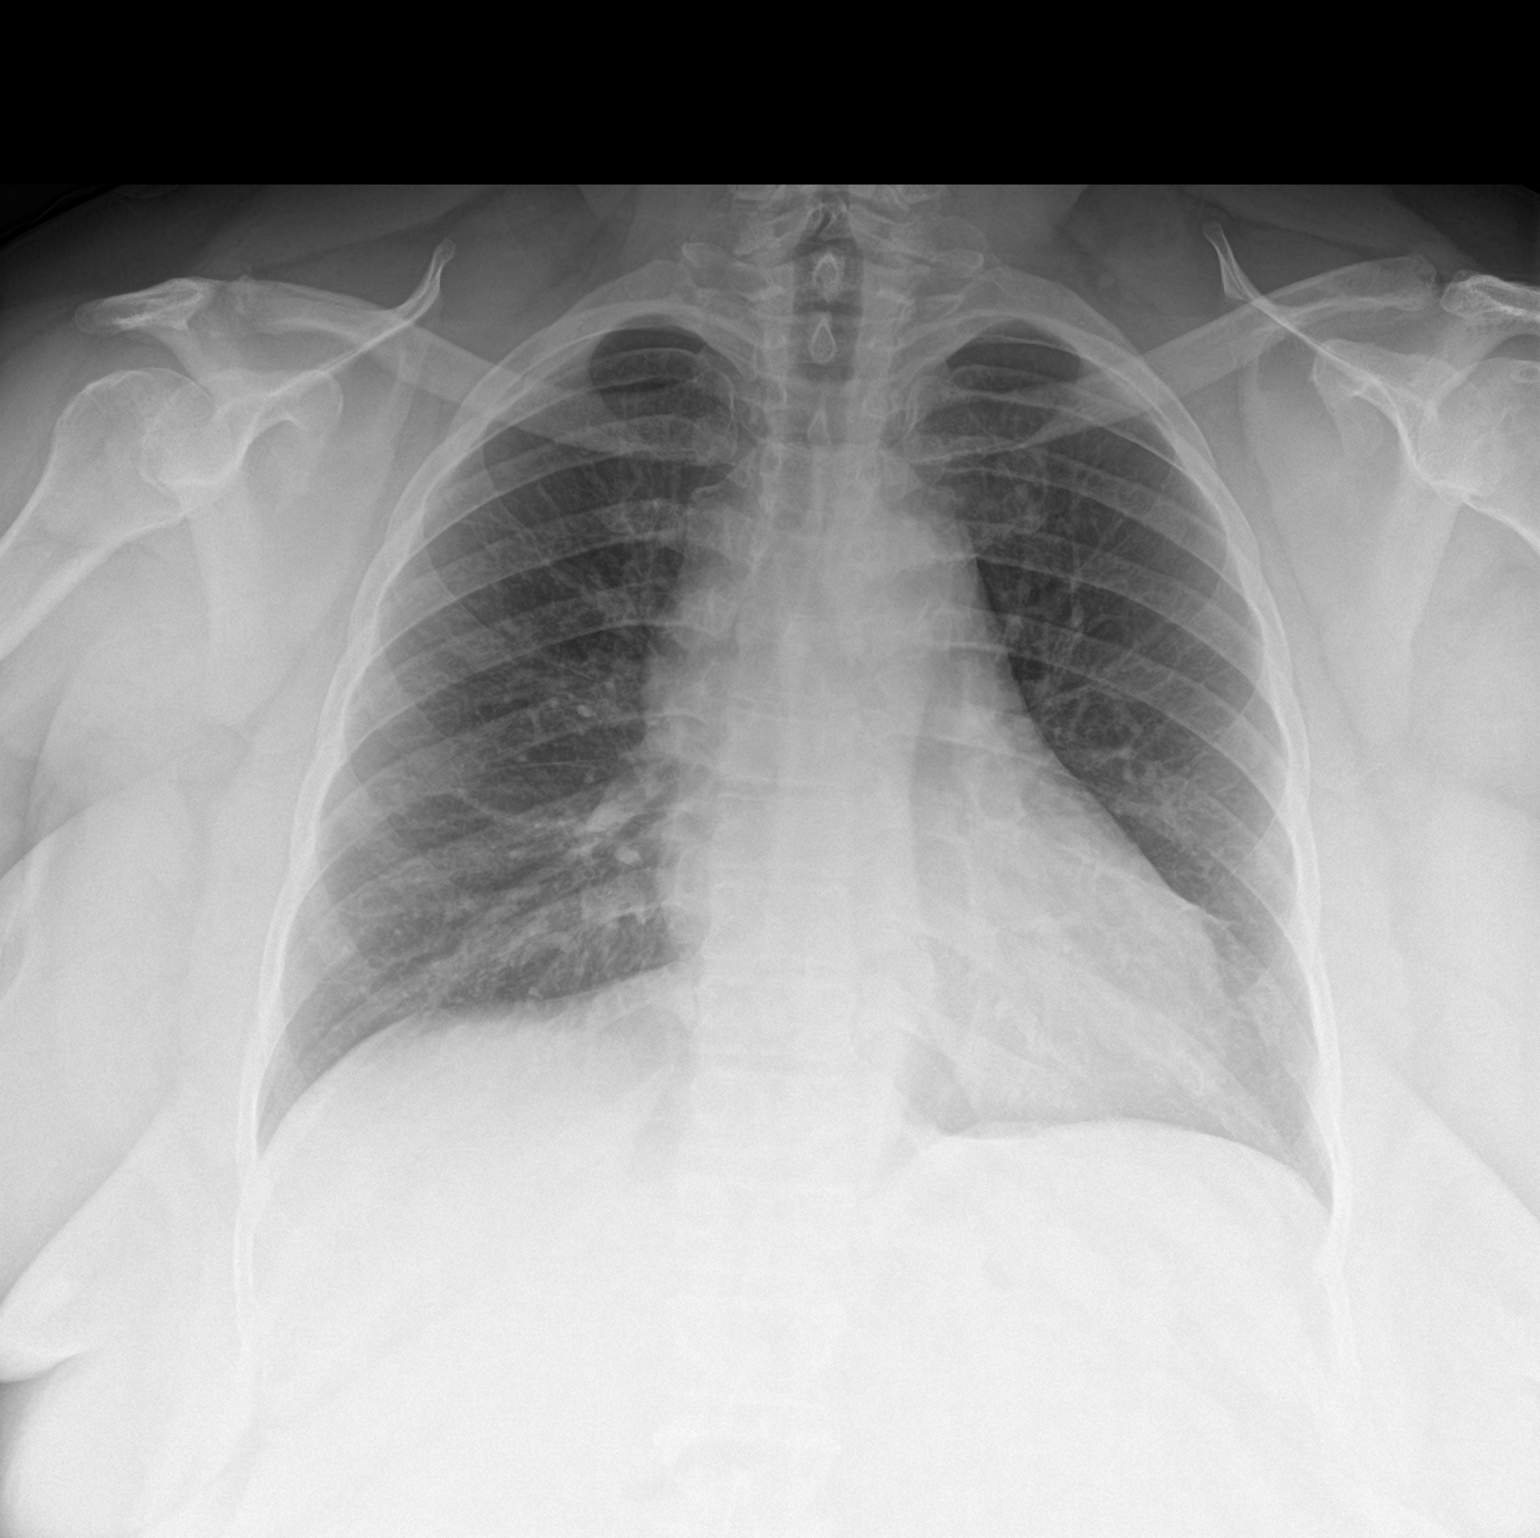

[chest lat]
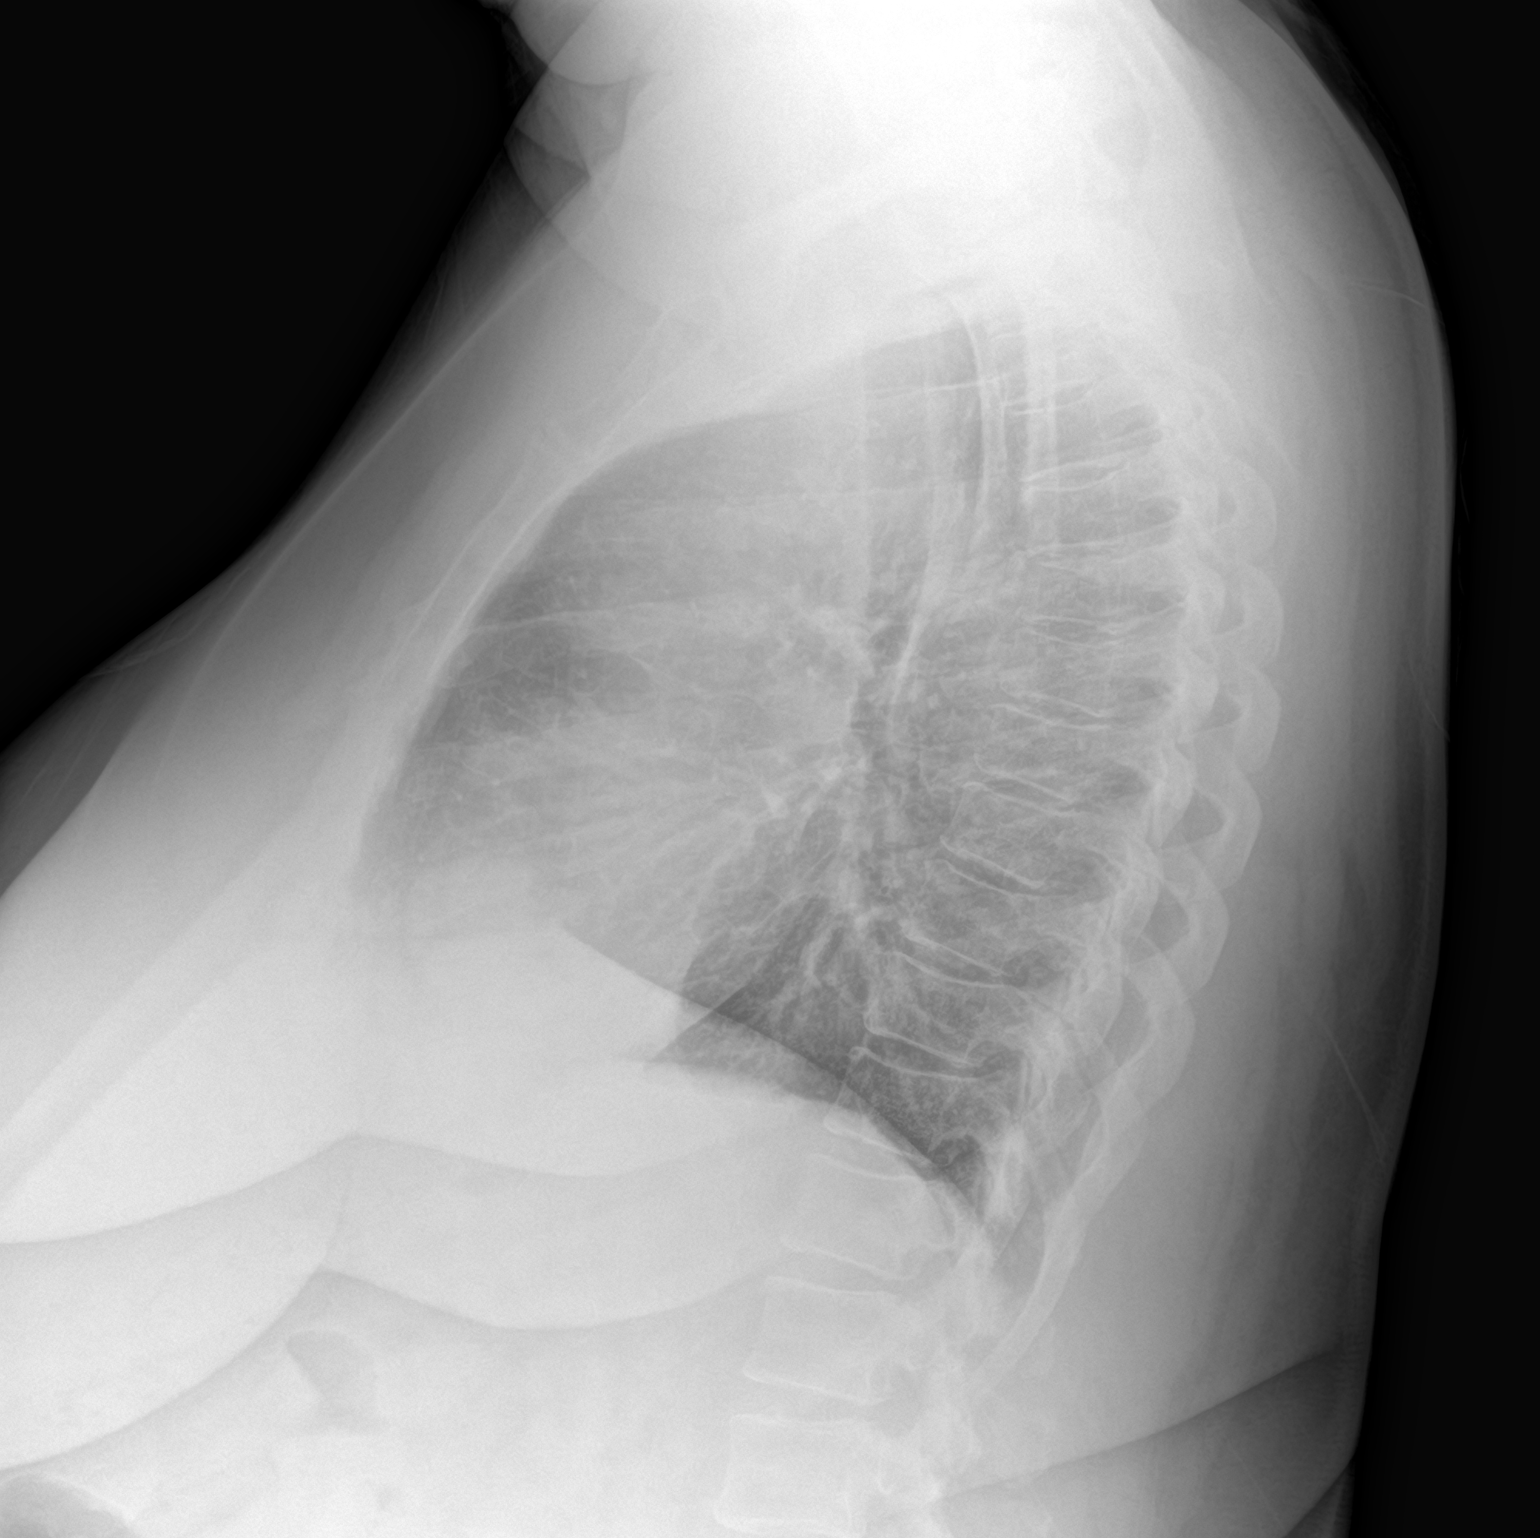

[2 of 2 positions shown; findings below may reference images not displayed]

FINDINGS: The heart size is normal. Right paratracheal density on the frontal
examination is likely due to a combination of mediastinal fat,
vascular structures and the overlying sternum. Adenopathy is
difficult to exclude in this clinical context. No hilar enlargement
identified. The lungs are clear. There is no pleural effusion or
pneumothorax. The bones appear unremarkable.
IMPRESSION: No acute chest findings. Right paratracheal prominence is likely
incidental, but given the history of left renal neoplasm, adenopathy
cannot be excluded. Chest CT recommended for further evaluation.

## 2017-09-10 LAB — HEMOGLOBIN A1C: Hemoglobin A1C: 14

## 2017-10-07 ENCOUNTER — Encounter: Payer: Self-pay | Admitting: "Endocrinology

## 2017-10-07 ENCOUNTER — Ambulatory Visit (INDEPENDENT_AMBULATORY_CARE_PROVIDER_SITE_OTHER): Payer: Medicare Other | Admitting: "Endocrinology

## 2017-10-07 VITALS — BP 137/87 | HR 94 | Ht 63.0 in | Wt 262.0 lb

## 2017-10-07 DIAGNOSIS — N183 Chronic kidney disease, stage 3 (moderate): Secondary | ICD-10-CM | POA: Diagnosis not present

## 2017-10-07 DIAGNOSIS — E1122 Type 2 diabetes mellitus with diabetic chronic kidney disease: Secondary | ICD-10-CM | POA: Diagnosis not present

## 2017-10-07 DIAGNOSIS — I10 Essential (primary) hypertension: Secondary | ICD-10-CM

## 2017-10-07 DIAGNOSIS — E782 Mixed hyperlipidemia: Secondary | ICD-10-CM | POA: Diagnosis not present

## 2017-10-07 NOTE — Progress Notes (Signed)
Consult Note       10/07/2017, 1:10 PM   Subjective:    Patient ID: Abigail Jackson, female    DOB: 05-29-1966.  Abigail Jackson is being seen in consultation for management of currently uncontrolled symptomatic diabetes requested by  Shade Flood, MD.   Past Medical History:  Diagnosis Date  . Arthritis   . Diabetes mellitus without complication ([place])   . Heart murmur   . Hyperlipidemia   . Hypertension   . Irregular bowel habits   . Renal neoplasm    LEFT   Past Surgical History:  Procedure Laterality Date  . ROBOTIC ASSITED PARTIAL NEPHRECTOMY Left 08/03/2015   Procedure: ROBOTIC ASSISTED PARTIAL LEFT NEPHRECTOMY;  Surgeon: Raynelle Bring, MD;  Location: [facility];  Service: Urology;  Laterality: Left;  . SHOULDER SURGERY  2006   RIGHT   Social History   Socioeconomic History  . Marital status: Married    Spouse name: None  . Number of children: None  . Years of education: None  . Highest education level: None  Social Needs  . Financial resource strain: None  . Food insecurity - worry: None  . Food insecurity - inability: None  . Transportation needs - medical: None  . Transportation needs - non-medical: None  Occupational History  . None  Tobacco Use  . Smoking status: Never Smoker  . Smokeless tobacco: Never Used  Substance and Sexual Activity  . Alcohol use: No  . Drug use: No  . Sexual activity: None  Other Topics Concern  . None  Social History Narrative  . None   Outpatient Encounter Medications as of 10/07/2017  Medication Sig  . amitriptyline (ELAVIL) 75 MG tablet Take 75 mg by mouth at bedtime.  Marland Kitchen aspirin EC 81 MG tablet Take 81 mg by mouth daily.  . Ferrous Sulfate (IRON) 325 (65 Fe) MG TABS Take by mouth.  . hydrochlorothiazide (HYDRODIURIL) 25 MG tablet Take 25 mg by mouth daily.  Marland Kitchen linagliptin (TRADJENTA) 5 MG TABS tablet Take 5 mg by mouth daily.  Marland Kitchen  lisinopril (PRINIVIL,ZESTRIL) 10 MG tablet Take 10 mg by mouth daily.  . metoprolol succinate (TOPROL-XL) 50 MG 24 hr tablet Take 1 tablet (50 mg total) by mouth daily. Take with or immediately following a meal.  . omeprazole (PRILOSEC) 40 MG capsule Take 40 mg by mouth at bedtime.  . pravastatin (PRAVACHOL) 40 MG tablet Take 40 mg by mouth at bedtime.  . pregabalin (LYRICA) 75 MG capsule Take 75 mg by mouth 2 (two) times daily.  . [DISCONTINUED] metFORMIN (GLUCOPHAGE) 500 MG tablet Take by mouth 2 (two) times daily with a meal.  . cholecalciferol (VITAMIN D) 400 units TABS tablet Take 400 Units by mouth.  . [DISCONTINUED] ferrous sulfate 325 (65 FE) MG tablet Take 1 tablet (325 mg total) by mouth daily with breakfast.  . [DISCONTINUED] HYDROcodone-acetaminophen (NORCO) 7.5-325 MG tablet Take 1 tablet by mouth every 6 (six) hours as needed for moderate pain.  . [DISCONTINUED] medroxyPROGESTERone (DEPO-PROVERA) 150 MG/ML injection Inject 150 mg into the muscle every 3 (three) months.   No facility-administered encounter medications on  file as of 10/07/2017.     ALLERGIES: Allergies  Allergen Reactions  . Tramadol Itching    VACCINATION STATUS:  There is no immunization history on file for this patient.  Diabetes  She presents for her initial diabetic visit. She has type 2 diabetes mellitus. Onset time: She was diagnosed at approximate age of 28 years. Her disease course has been worsening. There are no hypoglycemic associated symptoms. Pertinent negatives for hypoglycemia include no confusion, headaches, pallor or seizures. Associated symptoms include blurred vision, fatigue, polydipsia and polyuria. Pertinent negatives for diabetes include no chest pain and no polyphagia. There are no hypoglycemic complications. Symptoms are worsening. Diabetic complications include nephropathy. Risk factors for coronary artery disease include dyslipidemia, diabetes mellitus, family history, hypertension,  obesity, sedentary lifestyle, post-menopausal and tobacco exposure. Current diabetic treatments: She is on metformin 500 mg p.o. twice daily, Tradjenta 5 mg p.o. daily. Her weight is stable. She is following a generally unhealthy diet. When asked about meal planning, she reported none. She has not had a previous visit with a dietitian. She never participates in exercise. (She did not bring any meter or logs to review today.  On September 10, 2017 her A1c was 14%.) An ACE inhibitor/angiotensin II receptor blocker is being taken. She does not see a podiatrist.Eye exam is current.  Hyperlipidemia  This is a chronic problem. The current episode started more than 1 year ago. Exacerbating diseases include chronic renal disease, diabetes and obesity. Pertinent negatives include no chest pain, myalgias or shortness of breath. Current antihyperlipidemic treatment includes statins. Risk factors for coronary artery disease include dyslipidemia, diabetes mellitus, hypertension, obesity, post-menopausal, a sedentary lifestyle and family history.  Hypertension  This is a chronic problem. The current episode started more than 1 year ago. The problem is controlled. Associated symptoms include blurred vision. Pertinent negatives include no chest pain, headaches, palpitations or shortness of breath. Risk factors for coronary artery disease include dyslipidemia, diabetes mellitus, obesity, sedentary lifestyle, smoking/tobacco exposure, post-menopausal state and family history. Past treatments include ACE inhibitors. Identifiable causes of hypertension include chronic renal disease.      Review of Systems  Constitutional: Positive for fatigue. Negative for chills, fever and unexpected weight change.  HENT: Negative for trouble swallowing and voice change.   Eyes: Positive for blurred vision. Negative for visual disturbance.  Respiratory: Negative for cough, shortness of breath and wheezing.   Cardiovascular: Negative for  chest pain, palpitations and leg swelling.  Gastrointestinal: Negative for diarrhea, nausea and vomiting.  Endocrine: Positive for polydipsia and polyuria. Negative for cold intolerance, heat intolerance and polyphagia.  Genitourinary:       She reports that she was diagnosed to renal cell carcinoma 3 years ago for which she underwent left nephrectomy.  Musculoskeletal: Negative for arthralgias and myalgias.  Skin: Negative for color change, pallor, rash and wound.  Neurological: Negative for seizures and headaches.  Psychiatric/Behavioral: Negative for confusion and suicidal ideas.    Objective:    BP 137/87   Pulse 94   Ht 5\' 3"  (1.6 m)   Wt 262 lb (118.8 kg)   BMI 46.41 kg/m   Wt Readings from Last 3 Encounters:  10/07/17 262 lb (118.8 kg)  08/03/15 265 lb (120.2 kg)  07/31/15 265 lb (120.2 kg)     Physical Exam  Constitutional: She is oriented to person, place, and time. She appears well-developed.  HENT:  Head: Normocephalic and atraumatic.  Eyes: EOM are normal.  Neck: Normal range of motion. Neck supple. No  tracheal deviation present. No thyromegaly present.  Cardiovascular: Normal rate and regular rhythm.  Pulmonary/Chest: Effort normal and breath sounds normal.  Abdominal: Soft. Bowel sounds are normal. There is no tenderness. There is no guarding.  Musculoskeletal: Normal range of motion. She exhibits edema.  Neurological: She is alert and oriented to person, place, and time. She has normal reflexes. No cranial nerve deficit. Coordination normal.  Skin: Skin is warm and dry. No rash noted. No erythema. No pallor.  Psychiatric: She has a normal mood and affect.  Reluctant to flex.      CMP ( most recent) CMP     Component Value Date/Time   NA 135 08/08/2015 0431   K 3.9 08/08/2015 0431   CL 108 08/08/2015 0431   CO2 21 (L) 08/08/2015 0431   GLUCOSE 139 (H) 08/08/2015 0431   BUN 15 08/08/2015 0431   CREATININE 1.82 (H) 08/08/2015 0431   CALCIUM 8.7 (L)  08/08/2015 0431   GFRNONAA 31 (L) 08/08/2015 0431   GFRAA 36 (L) 08/08/2015 0431     Diabetic Labs (most recent): Lab Results  Component Value Date   HGBA1C 8.5 (H) 07/31/2015     Lab Results  Component Value Date   TSH 2.547 08/06/2015      Assessment & Plan:   1. Type 2 diabetes mellitus with stage 3 chronic kidney disease, without long-term current use of insulin ([place])  - Abigail Jackson has currently uncontrolled symptomatic type 2 DM since 52 years of age,  with most recent A1c of 14 %. Recent labs reviewed.  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 These are all discussed in detail with the patient.  - I have counseled her on diet management and weight loss, by adopting a carbohydrate restricted/protein rich diet.  - Suggestion is made for her to avoid simple carbohydrates  from her diet including Cakes, Sweet Desserts, Ice Cream, Soda (diet and regular), Sweet Tea, Candies, Chips, Cookies, Store Bought Juices, Alcohol in Excess of  1-2 drinks a day, Artificial Sweeteners, and "Sugar-free" Products. This will help patient to have stable blood glucose profile and potentially avoid unintended weight gain.  - I encouraged her to switch to  unprocessed or minimally processed complex starch and increased protein intake (animal or plant source), fruits, and vegetables.  - she is advised to stick to a routine mealtimes to eat 3 meals  a day and avoid unnecessary snacks ( to snack only to correct hypoglycemia).   - she will be scheduled with Jearld Fenton, RDN, CDE for individualized diabetes education.  - I have approached her with the following individualized plan to manage diabetes and patient agrees:   -She has very limited options of  medications to treat her diabetes, she is an immediate candidate for insulin treatment.  However, unfortunately she is declining this offer for now.   - she is willing to start monitoring blood glucose 4 times a day-before meals and at bedtime and return in 1 week with her meter and logs.   -Patient is encouraged to call clinic for blood glucose levels less than 70 or above 300 mg /dl. - I will continue Tradjenta 5 mg p.o. daily, therapeutically suitable for patient . - I will discontinue metformin, and patient is not a candidate for SGLT2 inhibitors due  to CKD.  - she will be considered for incretin therapy as appropriate next visit. - Patient specific target  A1c;  LDL, HDL, Triglycerides, and  Waist Circumference were discussed in detail.  2) BP/HTN: Controlled to target. Continue current medications including ACEI/ARB. 3) Lipids/HPL:   No recent lipid panel.   Patient is advised to continue statins. 4)  Weight/Diet: CDE Consult will be initiated , exercise, and detailed carbohydrates information provided.  5) Chronic Care/Health Maintenance:  -she  is on ACEI/ARB and Statin medications and  is encouraged to continue to follow up with Ophthalmology, Dentist,  Podiatrist at least yearly or according to recommendations, and advised to  stay away from smoking. I have recommended yearly flu vaccine and pneumonia vaccination at least every 5 years; moderate intensity exercise for up to 150 minutes weekly; and  sleep for at least 7 hours a day.  - I advised patient to maintain close follow up with Shade Flood, MD for primary care needs.  - Time spent with the patient: 1 hour, of which >50% was spent in obtaining information about her symptoms, reviewing her previous labs, evaluations, and treatments, counseling her about her currently uncontrolled, complicated type 2 diabetes, hypertension, hyperlipidemia, and developing a plan for long term treatment; her  questions were answered to her  satisfaction.  Follow up plan: - Return in about 1 week (around 10/14/2017) for follow up with meter and logs- no labs.  Glade Lloyd, MD [facility] [address] [place], [place] [address] Phone: [phone]  Fax: [phone]    10/07/2017, 1:10 PM  This note was partially dictated with voice recognition software. Similar sounding words can be transcribed inadequately or may not  be corrected upon review.

## 2017-10-07 NOTE — Patient Instructions (Signed)

## 2017-10-09 ENCOUNTER — Other Ambulatory Visit: Payer: Self-pay

## 2017-10-09 ENCOUNTER — Other Ambulatory Visit: Payer: Self-pay | Admitting: "Endocrinology

## 2017-10-09 ENCOUNTER — Telehealth: Payer: Self-pay

## 2017-10-09 MED ORDER — GLIPIZIDE ER 5 MG PO TB24: 5.0000 mg | ORAL_TABLET | Freq: Every day | ORAL | 2 refills | Status: DC

## 2017-10-09 NOTE — Telephone Encounter (Signed)
I am sending glipizide 5 mg daily only with breakfast, continue to test her glucose 4 times a day and keep her appointment with her meter and logs.

## 2017-10-09 NOTE — Telephone Encounter (Signed)
Pt states she has had high BG readings.   Date Before breakfast Before lunch Before supper Bedtime  2/13 [phone]  2/14 317                   Pt taking: Tradjenta 5mg  qd

## 2017-10-09 NOTE — Telephone Encounter (Signed)
Pt.notified

## 2017-10-14 ENCOUNTER — Encounter: Payer: Self-pay | Admitting: "Endocrinology

## 2017-10-14 ENCOUNTER — Ambulatory Visit (INDEPENDENT_AMBULATORY_CARE_PROVIDER_SITE_OTHER): Payer: Medicare Other | Admitting: "Endocrinology

## 2017-10-14 VITALS — BP 128/81 | HR 81 | Ht 63.0 in | Wt 260.0 lb

## 2017-10-14 DIAGNOSIS — I10 Essential (primary) hypertension: Secondary | ICD-10-CM

## 2017-10-14 DIAGNOSIS — E1122 Type 2 diabetes mellitus with diabetic chronic kidney disease: Secondary | ICD-10-CM

## 2017-10-14 DIAGNOSIS — N183 Chronic kidney disease, stage 3 unspecified: Secondary | ICD-10-CM

## 2017-10-14 DIAGNOSIS — E782 Mixed hyperlipidemia: Secondary | ICD-10-CM

## 2017-10-14 NOTE — Progress Notes (Signed)
Consult Note       10/14/2017, 1:32 PM   Subjective:    Patient ID: Abigail Jackson, female    DOB: April 01, 1966.  Abigail Jackson is being seen in consultation for management of currently uncontrolled symptomatic diabetes requested by  Shade Flood, MD.   Past Medical History:  Diagnosis Date  . Arthritis   . Diabetes mellitus without complication ([place])   . Heart murmur   . Hyperlipidemia   . Hypertension   . Irregular bowel habits   . Renal neoplasm    LEFT   Past Surgical History:  Procedure Laterality Date  . ROBOTIC ASSITED PARTIAL NEPHRECTOMY Left 08/03/2015   Procedure: ROBOTIC ASSISTED PARTIAL LEFT NEPHRECTOMY;  Surgeon: Raynelle Bring, MD;  Location: [facility];  Service: Urology;  Laterality: Left;  . SHOULDER SURGERY  2006   RIGHT   Social History   Socioeconomic History  . Marital status: Married    Spouse name: Not on file  . Number of children: Not on file  . Years of education: Not on file  . Highest education level: Not on file  Social Needs  . Financial resource strain: Not on file  . Food insecurity - worry: Not on file  . Food insecurity - inability: Not on file  . Transportation needs - medical: Not on file  . Transportation needs - non-medical: Not on file  Occupational History  . Not on file  Tobacco Use  . Smoking status: Never Smoker  . Smokeless tobacco: Never Used  Substance and Sexual Activity  . Alcohol use: No  . Drug use: No  . Sexual activity: Not on file  Other Topics Concern  . Not on file  Social History Narrative  . Not on file   Outpatient Encounter Medications as of 10/14/2017  Medication Sig  . amitriptyline (ELAVIL) 75 MG tablet Take 75 mg by mouth at bedtime.  Marland Kitchen aspirin EC 81 MG tablet Take 81 mg by mouth daily.  . cholecalciferol (VITAMIN D) 400 units TABS tablet Take 400 Units by mouth.  . Ferrous Sulfate (IRON) 325 (65 Fe) MG TABS Take  by mouth.  Marland Kitchen glipiZIDE (GLUCOTROL XL) 5 MG 24 hr tablet Take 1 tablet (5 mg total) by mouth daily with breakfast.  . hydrochlorothiazide (HYDRODIURIL) 25 MG tablet Take 25 mg by mouth daily.  Marland Kitchen linagliptin (TRADJENTA) 5 MG TABS tablet Take 5 mg by mouth daily.  Marland Kitchen lisinopril (PRINIVIL,ZESTRIL) 10 MG tablet Take 10 mg by mouth daily.  . metoprolol succinate (TOPROL-XL) 50 MG 24 hr tablet Take 1 tablet (50 mg total) by mouth daily. Take with or immediately following a meal.  . omeprazole (PRILOSEC) 40 MG capsule Take 40 mg by mouth at bedtime.  . pravastatin (PRAVACHOL) 40 MG tablet Take 40 mg by mouth at bedtime.  . pregabalin (LYRICA) 75 MG capsule Take 75 mg by mouth 2 (two) times daily.   No facility-administered encounter medications on file as of 10/14/2017.     ALLERGIES: Allergies  Allergen Reactions  . Tramadol Itching    VACCINATION STATUS:  There is no immunization history on file for this  patient.  Diabetes  She presents for her follow-up diabetic visit. She has type 2 diabetes mellitus. Onset time: She was diagnosed at approximate age of 36 years. Her disease course has been improving. There are no hypoglycemic associated symptoms. Pertinent negatives for hypoglycemia include no confusion, headaches, pallor or seizures. Associated symptoms include blurred vision, fatigue, polydipsia and polyuria. Pertinent negatives for diabetes include no chest pain and no polyphagia. There are no hypoglycemic complications. Symptoms are improving. Diabetic complications include nephropathy. Risk factors for coronary artery disease include dyslipidemia, diabetes mellitus, family history, hypertension, obesity, sedentary lifestyle, post-menopausal and tobacco exposure. Current diabetic treatments: She is on metformin 500 mg p.o. twice daily, Tradjenta 5 mg p.o. daily. Her weight is decreasing steadily. She is following a generally unhealthy diet. When asked about meal planning, she reported none. She  has not had a previous visit with a dietitian. She never participates in exercise. Her breakfast blood glucose range is generally >200 mg/dl. Her lunch blood glucose range is generally 180-200 mg/dl. Her dinner blood glucose range is generally 140-180 mg/dl. Her bedtime blood glucose range is generally 180-200 mg/dl. Her overall blood glucose range is 180-200 mg/dl. (She did bring her logs, showing significant improvement in her glycemic profile currently on glipizide 5 mg extended release at breakfast and Tradjenta 5 mg p.o. daily.    On September 10, 2017 her A1c was 14%.) An ACE inhibitor/angiotensin II receptor blocker is being taken. She does not see a podiatrist.Eye exam is current.  Hyperlipidemia  This is a chronic problem. The current episode started more than 1 year ago. Exacerbating diseases include chronic renal disease, diabetes and obesity. Pertinent negatives include no chest pain, myalgias or shortness of breath. Current antihyperlipidemic treatment includes statins. Risk factors for coronary artery disease include dyslipidemia, diabetes mellitus, hypertension, obesity, post-menopausal, a sedentary lifestyle and family history.  Hypertension  This is a chronic problem. The current episode started more than 1 year ago. The problem is controlled. Associated symptoms include blurred vision. Pertinent negatives include no chest pain, headaches, palpitations or shortness of breath. Risk factors for coronary artery disease include dyslipidemia, diabetes mellitus, obesity, sedentary lifestyle, smoking/tobacco exposure, post-menopausal state and family history. Past treatments include ACE inhibitors. Identifiable causes of hypertension include chronic renal disease.      Review of Systems  Constitutional: Positive for fatigue. Negative for chills, fever and unexpected weight change.  HENT: Negative for trouble swallowing and voice change.   Eyes: Positive for blurred vision. Negative for visual  disturbance.  Respiratory: Negative for cough, shortness of breath and wheezing.   Cardiovascular: Negative for chest pain, palpitations and leg swelling.  Gastrointestinal: Negative for diarrhea, nausea and vomiting.  Endocrine: Positive for polydipsia and polyuria. Negative for cold intolerance, heat intolerance and polyphagia.  Genitourinary:       She reports that she was diagnosed with renal cell carcinoma 3 years ago for which she underwent left nephrectomy.  Musculoskeletal: Negative for arthralgias and myalgias.  Skin: Negative for color change, pallor, rash and wound.  Neurological: Negative for seizures and headaches.  Psychiatric/Behavioral: Negative for confusion and suicidal ideas.    Objective:    BP 128/81   Pulse 81   Ht 5\' 3"  (1.6 m)   Wt 260 lb (117.9 kg)   BMI 46.06 kg/m   Wt Readings from Last 3 Encounters:  10/14/17 260 lb (117.9 kg)  10/07/17 262 lb (118.8 kg)  08/03/15 265 lb (120.2 kg)     Physical Exam  Constitutional: She  is oriented to person, place, and time. She appears well-developed.  HENT:  Head: Normocephalic and atraumatic.  Eyes: EOM are normal.  Neck: Normal range of motion. Neck supple. No tracheal deviation present. No thyromegaly present.  Cardiovascular: Normal rate and regular rhythm.  Pulmonary/Chest: Effort normal and breath sounds normal.  Abdominal: Soft. Bowel sounds are normal. There is no tenderness. There is no guarding.  Musculoskeletal: Normal range of motion. She exhibits edema.  Neurological: She is alert and oriented to person, place, and time. She has normal reflexes. No cranial nerve deficit. Coordination normal.  Skin: Skin is warm and dry. No rash noted. No erythema. No pallor.  Psychiatric: She has a normal mood and affect.  Reluctant to flex.   CMP ( most recent) CMP     Component Value Date/Time   NA 135 08/08/2015 0431   K 3.9 08/08/2015 0431   CL 108 08/08/2015 0431   CO2 21 (L) 08/08/2015 0431   GLUCOSE  139 (H) 08/08/2015 0431   BUN 15 08/08/2015 0431   CREATININE 1.82 (H) 08/08/2015 0431   CALCIUM 8.7 (L) 08/08/2015 0431   GFRNONAA 31 (L) 08/08/2015 0431   GFRAA 36 (L) 08/08/2015 0431     Diabetic Labs (most recent): Lab Results  Component Value Date   HGBA1C 14 09/10/2017   HGBA1C 8.5 (H) 07/31/2015     Lab Results  Component Value Date   TSH 2.547 08/06/2015      Assessment & Plan:   1. Type 2 diabetes mellitus with stage 3 chronic kidney disease, without long-term current use of insulin ([place])  - Abigail Jackson has currently uncontrolled symptomatic type 2 DM since 52 years of age. -Since her last visit, and since addition of Glucotrol 5 mg p.o. daily with breakfast, she returned with significantly improved blood glucose profile although she still has above target fasting glycemia. - Her most recent A1c of 14 %. Recent labs reviewed.  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 These are all discussed in detail with the patient.  - I have counseled her on diet management and weight loss, by adopting a carbohydrate restricted/protein rich diet.  -  Suggestion is made for her to avoid simple carbohydrates  from her diet including Cakes, Sweet Desserts / Pastries, Ice Cream, Soda (diet and regular), Sweet Tea, Candies, Chips, Cookies, Store Bought Juices, Alcohol in Excess of  1-2 drinks a day, Artificial Sweeteners, and "Sugar-free" Products. This will help patient to have stable blood glucose profile and potentially avoid unintended weight gain.   - I encouraged her to switch to  unprocessed or minimally processed complex starch and increased protein intake (animal or plant source), fruits, and vegetables.  - she is advised to stick to a  routine mealtimes to eat 3 meals  a day and avoid unnecessary snacks ( to snack only to correct hypoglycemia).   - she will be scheduled with Jearld Fenton, RDN, CDE for individualized diabetes education.  - I have approached her with the following individualized plan to manage diabetes and patient agrees:   -She has very limited options of medications to treat her diabetes, she is an immediate candidate for insulin treatment.  However, unfortunately she is still reluctant to go on insulin for now.   -Based on her response to  glipizide and Tradjenta therapy she can be observed out insulin until her next visit.     - she is willing to continue  monitoring blood glucose 2 times a day-before breakfast and at bedtime. -Patient is encouraged to call clinic for blood glucose levels less than 70 or above 300 mg /dl. - I will continue Tradjenta 5 mg p.o. daily, therapeutically suitable for patient . -I have advised her to continue Glucotrol 5 mg p.o. daily with breakfast. -She is not a candidate for  metformin, nor for SGLT2 inhibitors due to CKD.  - Patient specific target  A1c;  LDL, HDL, Triglycerides, and  Waist Circumference were discussed in detail.  2) BP/HTN: Her blood pressure is controlled to target at 128/81.  She is advised to continue her current medications including ACEI/ARB. 3) Lipids/HPL:   No recent lipid panel.   Patient is advised to continue statins.  She will have fasting lipid panel before her next visit. 4)  Weight/Diet: CDE Consult has been  initiated , exercise, and detailed carbohydrates information provided.  5) Chronic Care/Health Maintenance:  -she  is on ACEI/ARB and Statin medications and  is encouraged to continue to follow up with Ophthalmology, Dentist,  Podiatrist at least yearly or according to recommendations, and advised to  stay away from smoking. I have recommended yearly flu vaccine and pneumonia vaccination at least every 5 years; moderate intensity  exercise for up to 150 minutes weekly; and  sleep for at least 7 hours a day.  - I advised patient to maintain close follow up with Shade Flood, MD for primary care needs.  - Time spent with the patient: 25 min, of which >50% was spent in reviewing her blood glucose logs , discussing her hypo- and hyper-glycemic episodes, reviewing her current and  previous labs and insulin doses and developing a plan to avoid hypo- and hyper-glycemia. Please refer to Patient Instructions for Blood Glucose Monitoring and Insulin/Medications Dosing Guide"  in media tab for additional information.  Follow up plan: - Return in about 10 weeks (around 12/23/2017).  Glade Lloyd, MD [facility] [address] [place], [place] [address] Phone: [phone]  Fax: [phone]    10/14/2017, 1:32 PM  This note was partially dictated with voice recognition software. Similar sounding words can be transcribed inadequately or may not  be corrected upon review.

## 2017-10-14 NOTE — Patient Instructions (Signed)

## 2017-10-16 ENCOUNTER — Other Ambulatory Visit: Payer: Self-pay

## 2017-10-16 MED ORDER — GLIPIZIDE ER 5 MG PO TB24: 5.0000 mg | ORAL_TABLET | Freq: Every day | ORAL | 2 refills | Status: DC

## 2017-12-10 ENCOUNTER — Other Ambulatory Visit: Payer: Self-pay

## 2017-12-10 MED ORDER — GLUCOSE BLOOD VI STRP: 1.0000 | ORAL_STRIP | Freq: Two times a day (BID) | 5 refills | Status: DC

## 2017-12-10 MED ORDER — LANCETS MISC. MISC: 1.0000 | Freq: Two times a day (BID) | 5 refills | Status: DC

## 2017-12-11 LAB — LIPID PANEL
Cholesterol: 194 mg/dL (ref <200)
HDL: 42 mg/dL — ABNORMAL LOW (ref >50)
LDL Cholesterol (Calc): 124 mg/dL (calc) — ABNORMAL HIGH
Non-HDL Cholesterol (Calc): 152 mg/dL (calc) — ABNORMAL HIGH (ref <130)
Total CHOL/HDL Ratio: 4.6 (calc) (ref <5.0)
Triglycerides: 165 mg/dL — ABNORMAL HIGH (ref <150)

## 2017-12-11 LAB — COMPLETE METABOLIC PANEL WITH GFR
AG Ratio: 1.4 (calc) (ref 1.0–2.5)
ALT: 20 U/L (ref 6–29)
AST: 20 U/L (ref 10–35)
Albumin: 4.2 g/dL (ref 3.6–5.1)
Alkaline phosphatase (APISO): 29 U/L — ABNORMAL LOW (ref 33–130)
BUN/Creatinine Ratio: 13 (calc) (ref 6–22)
BUN: 16 mg/dL (ref 7–25)
CO2: 30 mmol/L (ref 20–32)
Calcium: 10 mg/dL (ref 8.6–10.4)
Chloride: 102 mmol/L (ref 98–110)
Creat: 1.25 mg/dL — ABNORMAL HIGH (ref 0.50–1.05)
GFR, Est African American: 57 mL/min/{1.73_m2} — ABNORMAL LOW (ref >=60)
GFR, Est Non African American: 49 mL/min/{1.73_m2} — ABNORMAL LOW (ref >=60)
Globulin: 3.1 g/dL (calc) (ref 1.9–3.7)
Glucose, Bld: 135 mg/dL — ABNORMAL HIGH (ref 65–99)
Potassium: 4 mmol/L (ref 3.5–5.3)
Sodium: 143 mmol/L (ref 135–146)
Total Bilirubin: 0.5 mg/dL (ref 0.2–1.2)
Total Protein: 7.3 g/dL (ref 6.1–8.1)

## 2017-12-11 LAB — HEMOGLOBIN A1C
Hgb A1c MFr Bld: 10.2 % of total Hgb — ABNORMAL HIGH (ref <5.7)
Mean Plasma Glucose: 246 (calc)
eAG (mmol/L): 13.6 (calc)

## 2017-12-11 LAB — MICROALBUMIN / CREATININE URINE RATIO
Creatinine, Urine: 135 mg/dL (ref 20–275)
Microalb Creat Ratio: 13 mcg/mg creat (ref <30)
Microalb, Ur: 1.7 mg/dL

## 2017-12-15 ENCOUNTER — Other Ambulatory Visit: Payer: Self-pay

## 2017-12-15 MED ORDER — LANCETS MISC. MISC: 1.0000 | Freq: Two times a day (BID) | 5 refills | Status: DC

## 2017-12-16 ENCOUNTER — Telehealth: Payer: Self-pay | Admitting: "Endocrinology

## 2017-12-16 MED ORDER — GLUCOSE BLOOD VI STRP: 1.0000 | ORAL_STRIP | Freq: Two times a day (BID) | 5 refills | Status: DC

## 2017-12-16 NOTE — Telephone Encounter (Signed)
Rx sent 

## 2017-12-16 NOTE — Telephone Encounter (Signed)
Abigail Jackson is asking for test strips for her Accu Check Guide please send to [facility] in [place]

## 2017-12-26 ENCOUNTER — Encounter: Payer: Self-pay | Admitting: "Endocrinology

## 2017-12-26 ENCOUNTER — Ambulatory Visit (INDEPENDENT_AMBULATORY_CARE_PROVIDER_SITE_OTHER): Payer: Medicare Other | Admitting: "Endocrinology

## 2017-12-26 VITALS — BP 113/75 | HR 85 | Ht 63.0 in | Wt 263.4 lb

## 2017-12-26 DIAGNOSIS — E1122 Type 2 diabetes mellitus with diabetic chronic kidney disease: Secondary | ICD-10-CM | POA: Diagnosis not present

## 2017-12-26 DIAGNOSIS — I10 Essential (primary) hypertension: Secondary | ICD-10-CM | POA: Diagnosis not present

## 2017-12-26 DIAGNOSIS — E782 Mixed hyperlipidemia: Secondary | ICD-10-CM

## 2017-12-26 DIAGNOSIS — N183 Chronic kidney disease, stage 3 unspecified: Secondary | ICD-10-CM

## 2017-12-26 MED ORDER — GLIPIZIDE 5 MG PO TABS: 5.0000 mg | ORAL_TABLET | Freq: Two times a day (BID) | ORAL | 3 refills | Status: DC

## 2017-12-26 NOTE — Progress Notes (Signed)
Endocrinology follow up  Note       12/26/2017, 3:14 PM   Subjective:    Patient ID: Abigail Jackson, female    DOB: [birth year].  Abigail Jackson is being seen in follow up for management of currently uncontrolled symptomatic diabetes requested by  Shade Flood, MD.   Past Medical History:  Diagnosis Date  . Arthritis   . Diabetes mellitus without complication ([place])   . Heart murmur   . Hyperlipidemia   . Hypertension   . Irregular bowel habits   . Renal neoplasm    LEFT   Past Surgical History:  Procedure Laterality Date  . ROBOTIC ASSITED PARTIAL NEPHRECTOMY Left 08/03/2015   Procedure: ROBOTIC ASSISTED PARTIAL LEFT NEPHRECTOMY;  Surgeon: Raynelle Bring, MD;  Location: [facility];  Service: Urology;  Laterality: Left;  . SHOULDER SURGERY  2006   RIGHT   Social History   Socioeconomic History  . Marital status: Married    Spouse name: Not on file  . Number of children: Not on file  . Years of education: Not on file  . Highest education level: Not on file  Occupational History  . Not on file  Social Needs  . Financial resource strain: Not on file  . Food insecurity:    Worry: Not on file    Inability: Not on file  . Transportation needs:    Medical: Not on file    Non-medical: Not on file  Tobacco Use  . Smoking status: Never Smoker  . Smokeless tobacco: Never Used  Substance and Sexual Activity  . Alcohol use: No  . Drug use: No  . Sexual activity: Not on file  Lifestyle  . Physical activity:    Days per week: Not on file    Minutes per session: Not on file  . Stress: Not on file  Relationships  . Social connections:    Talks on phone: Not on file    Gets together: Not on file    Attends religious service: Not on file    Active member of club or organization: Not on file    Attends meetings of clubs or organizations: Not on file    Relationship status: Not on file   Other Topics Concern  . Not on file  Social History Narrative  . Not on file   Outpatient Encounter Medications as of 12/26/2017  Medication Sig  . acetaminophen-codeine (TYLENOL #3) 300-30 MG tablet Take 1 tablet by mouth 3 (three) times daily as needed for pain.  Marland Kitchen amitriptyline (ELAVIL) 75 MG tablet Take 75 mg by mouth at bedtime.  Marland Kitchen aspirin EC 81 MG tablet Take 81 mg by mouth daily.  . cholecalciferol (VITAMIN D) 400 units TABS tablet Take 400 Units by mouth.  . Ferrous Sulfate (IRON) 325 (65 Fe) MG TABS Take by mouth.  Marland Kitchen glucose blood test strip 1 each by Other route as needed. Use as instructed  . glucose blood test strip 1 each by Other route 2 (two) times daily. Use as instructed  . hydrochlorothiazide (HYDRODIURIL) 25 MG tablet Take 25 mg by mouth daily.  . Lancets Misc.  MISC 1 each by Does not apply route 2 (two) times daily.  Marland Kitchen linagliptin (TRADJENTA) 5 MG TABS tablet Take 5 mg by mouth daily.  Marland Kitchen lisinopril (PRINIVIL,ZESTRIL) 10 MG tablet Take 10 mg by mouth daily.  Marland Kitchen LYRICA 300 MG capsule Take 300 mg by mouth 2 (two) times daily.  . metoprolol succinate (TOPROL-XL) 50 MG 24 hr tablet Take 1 tablet (50 mg total) by mouth daily. Take with or immediately following a meal.  . omeprazole (PRILOSEC) 40 MG capsule Take 40 mg by mouth at bedtime.  . pravastatin (PRAVACHOL) 40 MG tablet Take 40 mg by mouth at bedtime.  . [DISCONTINUED] glipiZIDE (GLUCOTROL XL) 5 MG 24 hr tablet Take 1 tablet (5 mg total) by mouth daily with breakfast.  . glipiZIDE (GLUCOTROL) 5 MG tablet Take 1 tablet (5 mg total) by mouth 2 (two) times daily with a meal.  . [DISCONTINUED] pregabalin (LYRICA) 75 MG capsule Take 75 mg by mouth 2 (two) times daily.   No facility-administered encounter medications on file as of 12/26/2017.     ALLERGIES: Allergies  Allergen Reactions  . Tramadol Itching    VACCINATION STATUS:  There is no immunization history on file for this patient.  Diabetes  She presents for  her follow-up diabetic visit. She has type 2 diabetes mellitus. Onset time: She was diagnosed at approximate age of 40 years. Her disease course has been improving. There are no hypoglycemic associated symptoms. Pertinent negatives for hypoglycemia include no confusion, headaches, pallor or seizures. Associated symptoms include blurred vision, fatigue, polydipsia and polyuria. Pertinent negatives for diabetes include no chest pain and no polyphagia. There are no hypoglycemic complications. Symptoms are improving. Diabetic complications include nephropathy. Risk factors for coronary artery disease include dyslipidemia, diabetes mellitus, family history, hypertension, obesity, sedentary lifestyle, post-menopausal and tobacco exposure. Current diabetic treatments: She is on metformin 500 mg p.o. twice daily, Tradjenta 5 mg p.o. daily. Her weight is increasing steadily. She is following a generally unhealthy diet. When asked about meal planning, she reported none. She has not had a previous visit with a dietitian. She never participates in exercise. Her breakfast blood glucose range is generally >200 mg/dl. Her lunch blood glucose range is generally >200 mg/dl. Her dinner blood glucose range is generally >200 mg/dl. Her bedtime blood glucose range is generally >200 mg/dl. Her overall blood glucose range is >200 mg/dl. (She did bring her logs, showing significant improvement in her glycemic profile currently on glipizide 5 mg extended release at breakfast and Tradjenta 5 mg p.o. daily.    On September 10, 2017 her A1c was 14%.) An ACE inhibitor/angiotensin II receptor blocker is being taken. She does not see a podiatrist.Eye exam is current.  Hyperlipidemia  This is a chronic problem. The current episode started more than 1 year ago. Exacerbating diseases include chronic renal disease, diabetes and obesity. Pertinent negatives include no chest pain, myalgias or shortness of breath. Current antihyperlipidemic treatment  includes statins. Risk factors for coronary artery disease include dyslipidemia, diabetes mellitus, hypertension, obesity, post-menopausal, a sedentary lifestyle and family history.  Hypertension  This is a chronic problem. The current episode started more than 1 year ago. The problem is controlled. Associated symptoms include blurred vision. Pertinent negatives include no chest pain, headaches, palpitations or shortness of breath. Risk factors for coronary artery disease include dyslipidemia, diabetes mellitus, obesity, sedentary lifestyle, smoking/tobacco exposure, post-menopausal state and family history. Past treatments include ACE inhibitors. Identifiable causes of hypertension include chronic renal disease.  Review of Systems  Constitutional: Positive for fatigue. Negative for chills, fever and unexpected weight change.  HENT: Negative for trouble swallowing and voice change.   Eyes: Positive for blurred vision. Negative for visual disturbance.  Respiratory: Negative for cough, shortness of breath and wheezing.   Cardiovascular: Negative for chest pain, palpitations and leg swelling.  Gastrointestinal: Negative for diarrhea, nausea and vomiting.  Endocrine: Positive for polydipsia and polyuria. Negative for cold intolerance, heat intolerance and polyphagia.  Genitourinary:       She reports that she was diagnosed with renal cell carcinoma 3 years ago for which she underwent left nephrectomy.  Musculoskeletal: Negative for arthralgias and myalgias.  Skin: Negative for color change, pallor, rash and wound.  Neurological: Negative for seizures and headaches.  Psychiatric/Behavioral: Negative for confusion and suicidal ideas.    Objective:    BP 113/75 (BP Location: Left Arm, Patient Position: Sitting, Cuff Size: Large)   Pulse 85   Ht 5\' 3"  (1.6 m)   Wt 263 lb 6.4 oz (119.5 kg)   BMI 46.66 kg/m   Wt Readings from Last 3 Encounters:  12/26/17 263 lb 6.4 oz (119.5 kg)  10/14/17 260  lb (117.9 kg)  10/07/17 262 lb (118.8 kg)     Physical Exam  Constitutional: She is oriented to person, place, and time. She appears well-developed.  HENT:  Head: Normocephalic and atraumatic.  Eyes: EOM are normal.  Neck: Normal range of motion. Neck supple. No tracheal deviation present. No thyromegaly present.  Cardiovascular: Normal rate.  Pulmonary/Chest: Effort normal.  Abdominal: There is no tenderness. There is no guarding.  Musculoskeletal: Normal range of motion. She exhibits edema.  Neurological: She is alert and oriented to person, place, and time. She has normal reflexes. No cranial nerve deficit. Coordination normal.  Skin: Skin is warm and dry. No rash noted. No erythema. No pallor.  Psychiatric: She has a normal mood and affect.  Reluctant affect.    CMP ( most recent) CMP     Component Value Date/Time   NA 143 12/10/2017 0844   K 4.0 12/10/2017 0844   CL 102 12/10/2017 0844   CO2 30 12/10/2017 0844   GLUCOSE 135 (H) 12/10/2017 0844   BUN 16 12/10/2017 0844   CREATININE 1.25 (H) 12/10/2017 0844   CALCIUM 10.0 12/10/2017 0844   PROT 7.3 12/10/2017 0844   AST 20 12/10/2017 0844   ALT 20 12/10/2017 0844   BILITOT 0.5 12/10/2017 0844   GFRNONAA 49 (L) 12/10/2017 0844   GFRAA 57 (L) 12/10/2017 0844     Diabetic Labs (most recent): Lab Results  Component Value Date   HGBA1C 10.2 (H) 12/10/2017   HGBA1C 14 09/10/2017   HGBA1C 8.5 (H) 07/31/2015     Lab Results  Component Value Date   TSH 2.547 08/06/2015    Lipid Panel     Component Value Date/Time   CHOL 194 12/10/2017 0844   TRIG 165 (H) 12/10/2017 0844   HDL 42 (L) 12/10/2017 0844   CHOLHDL 4.6 12/10/2017 0844   LDLCALC 124 (H) 12/10/2017 0844     Assessment & Plan:   1. Type 2 diabetes mellitus with stage 3 chronic kidney disease, CAD.   Abigail Jackson has currently uncontrolled symptomatic type 2 DM since 52 years of age. -She came with improved a1c of 10.2% improving from 14% last  check.   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 These are all discussed in detail with the patient.  - I have counseled her on diet management and weight loss, by adopting a carbohydrate restricted/protein rich diet.  -  Suggestion is made for her to avoid simple carbohydrates  from her diet including Cakes, Sweet Desserts / Pastries, Ice Cream, Soda (diet and regular), Sweet Tea, Candies, Chips, Cookies, Store Bought Juices, Alcohol in Excess of  1-2 drinks a day, Artificial Sweeteners, and "Sugar-free" Products. This will help patient to have stable blood glucose profile and potentially avoid unintended weight gain.   - I encouraged her to switch to  unprocessed or minimally processed complex starch and increased protein intake (animal or plant source), fruits, and vegetables.  - she is advised to stick to a routine mealtimes to eat 3 meals  a day and avoid unnecessary snacks ( to snack only to correct hypoglycemia).   - she is following with  Jearld Fenton, RDN, CDE for individualized diabetes education.  - I have approached her with the following individualized plan to manage diabetes and patient agrees:   -She has very limited options of medications to treat her diabetes, she is still hesitant to go onr insulin treatment. -Based on her response to glipizide and Tradjenta therapy she can be observed with out insulin until her next visit.     - she is willing to continue  monitoring blood glucose 2 times a day-before breakfast and at bedtime. -Patient is encouraged to call clinic for blood glucose levels less than 70 or above 300 mg /dl. - I will continue Tradjenta 5 mg p.o. daily, therapeutically suitable for patient . -I have advised her to  increase her glipizide to 5 mg po BID with meals. -She is not a candidate for  metformin, nor for SGLT2 inhibitors due to CKD.  - Patient specific target  A1c;  LDL, HDL, Triglycerides, and  Waist Circumference were discussed in detail.  2) BP/HTN: Her blood pressure is controlled to target at 128/81.  She is advised to continue her current medications including Lisinopril 10 mg po q am.  3) Lipids/HPL:  Her recent lipids show uncontrolled LDL at 124.  Patient is advised to continue statins.  4)  Weight/Diet: CDE Consult has been  initiated , exercise, and detailed carbohydrates information provided.  5) Chronic Care/Health Maintenance:  -she  is on ACEI/ARB and Statin medications and  is encouraged to continue to follow up with Ophthalmology, Dentist,  Podiatrist at least yearly or according to recommendations, and advised to  stay away from smoking. I have recommended yearly flu vaccine and pneumonia vaccination at least every 5 years; moderate intensity exercise for up to 150 minutes weekly; and  sleep for at least 7 hours a day.  - I advised patient to maintain close follow up with Shade Flood, MD for primary care needs.  - Time spent with the patient: 25 min, of which >50% was spent in reviewing her blood glucose logs , discussing her hypo- and hyper-glycemic episodes, reviewing her current and  previous labs and insulin doses and developing a plan to avoid hypo- and hyper-glycemia. Please refer to Patient Instructions for Blood Glucose Monitoring and Insulin/Medications Dosing Guide"  in media tab for additional information. Abigail Jackson participated in the discussions, expressed understanding, and voiced agreement with the above plans.  All questions were answered to her satisfaction.  she is encouraged to contact clinic should she have any questions or concerns prior to her return visit.  Follow up plan: - Return in about 3 months (around 03/28/2018) for follow up with pre-visit  labs, meter, and logs.  Glade Lloyd, MD [facility] [address] [place], [place] [address] Phone: [phone]  Fax: [phone]    12/26/2017, 3:14 PM  This note was partially dictated with voice recognition software. Similar sounding words can be transcribed inadequately or may not  be corrected upon review.

## 2017-12-26 NOTE — Patient Instructions (Signed)

## 2018-02-04 ENCOUNTER — Other Ambulatory Visit: Payer: Self-pay

## 2018-02-04 MED ORDER — GLUCOSE BLOOD VI STRP: 1.0000 | ORAL_STRIP | Freq: Two times a day (BID) | 5 refills | Status: DC

## 2018-02-04 MED ORDER — LANCETS MISC. MISC: 1.0000 | Freq: Two times a day (BID) | 5 refills | Status: DC

## 2018-02-10 ENCOUNTER — Other Ambulatory Visit: Payer: Self-pay

## 2018-02-10 MED ORDER — GLUCOSE BLOOD VI STRP: 1.0000 | ORAL_STRIP | Freq: Two times a day (BID) | 2 refills | Status: DC

## 2018-02-10 MED ORDER — LANCETS MISC. MISC: 1.0000 | Freq: Two times a day (BID) | 2 refills | Status: DC

## 2018-02-10 NOTE — Telephone Encounter (Signed)
Pt called stating that her test strip and lancet prescription needed to be sent to [place].   Rx sent

## 2018-03-20 ENCOUNTER — Other Ambulatory Visit: Payer: Self-pay | Admitting: "Endocrinology

## 2018-03-24 ENCOUNTER — Encounter: Payer: Self-pay | Admitting: "Endocrinology

## 2018-04-03 ENCOUNTER — Ambulatory Visit: Payer: Medicare Other | Admitting: "Endocrinology

## 2018-04-14 LAB — COMPLETE METABOLIC PANEL WITH GFR
AG Ratio: 1.5 (calc) (ref 1.0–2.5)
ALT: 21 U/L (ref 6–29)
AST: 25 U/L (ref 10–35)
Albumin: 4.6 g/dL (ref 3.6–5.1)
Alkaline phosphatase (APISO): 29 U/L — ABNORMAL LOW (ref 33–130)
BUN/Creatinine Ratio: 12 (calc) (ref 6–22)
BUN: 17 mg/dL (ref 7–25)
CO2: 30 mmol/L (ref 20–32)
Calcium: 10.4 mg/dL (ref 8.6–10.4)
Chloride: 104 mmol/L (ref 98–110)
Creat: 1.46 mg/dL — ABNORMAL HIGH (ref 0.50–1.05)
GFR, Est African American: 47 mL/min/{1.73_m2} — ABNORMAL LOW (ref >=60)
GFR, Est Non African American: 41 mL/min/{1.73_m2} — ABNORMAL LOW (ref >=60)
Globulin: 3.1 g/dL (calc) (ref 1.9–3.7)
Glucose, Bld: 132 mg/dL — ABNORMAL HIGH (ref 65–99)
Potassium: 4.5 mmol/L (ref 3.5–5.3)
Sodium: 145 mmol/L (ref 135–146)
Total Bilirubin: 0.4 mg/dL (ref 0.2–1.2)
Total Protein: 7.7 g/dL (ref 6.1–8.1)

## 2018-04-14 LAB — HEMOGLOBIN A1C
Hgb A1c MFr Bld: 11.1 % of total Hgb — ABNORMAL HIGH (ref <5.7)
Mean Plasma Glucose: 272 (calc)
eAG (mmol/L): 15.1 (calc)

## 2018-04-23 ENCOUNTER — Ambulatory Visit (INDEPENDENT_AMBULATORY_CARE_PROVIDER_SITE_OTHER): Payer: Medicare Other | Admitting: "Endocrinology

## 2018-04-23 ENCOUNTER — Encounter: Payer: Self-pay | Admitting: "Endocrinology

## 2018-04-23 ENCOUNTER — Encounter: Payer: Medicare Other | Attending: Emergency Medicine | Admitting: Nutrition

## 2018-04-23 ENCOUNTER — Encounter: Payer: Self-pay | Admitting: Nutrition

## 2018-04-23 VITALS — BP 150/72 | HR 78 | Ht 63.0 in | Wt 260.0 lb

## 2018-04-23 DIAGNOSIS — Z713 Dietary counseling and surveillance: Secondary | ICD-10-CM | POA: Insufficient documentation

## 2018-04-23 DIAGNOSIS — E1122 Type 2 diabetes mellitus with diabetic chronic kidney disease: Secondary | ICD-10-CM | POA: Diagnosis not present

## 2018-04-23 DIAGNOSIS — E782 Mixed hyperlipidemia: Secondary | ICD-10-CM

## 2018-04-23 DIAGNOSIS — N183 Chronic kidney disease, stage 3 unspecified: Secondary | ICD-10-CM

## 2018-04-23 DIAGNOSIS — I10 Essential (primary) hypertension: Secondary | ICD-10-CM

## 2018-04-23 MED ORDER — INSULIN PEN NEEDLE 31G X 8 MM MISC: 1.0000 | 3 refills | Status: DC

## 2018-04-23 MED ORDER — INSULIN DEGLUDEC 100 UNIT/ML ~~LOC~~ SOPN: 20.0000 [IU] | PEN_INJECTOR | Freq: Every day | SUBCUTANEOUS | 2 refills | Status: DC

## 2018-04-23 NOTE — Progress Notes (Signed)
  Medical Nutrition Therapy:  Appt start time: 0900 end time:  0915   Assessment:  Primary concerns today: walk in visit from Dr. Dorris Fetch. Type 2. Obesity. Lab Results  Component Value Date   HGBA1C 11.1 (H) 04/13/2018  Ssaw Dr. Dorris Fetch today. Started insulin today. Hasn't been hungry and only eats 1 meal per day. Testing blood sugars BID and now will tesx 4 times per day til next visit. Financial barriers and transportation issues. Limited congnative function-needs low literacy and hands on/visual aides. Willing to make effort to improve her eating habits and start walking.    Preferred Learning Style:  Auditory  Visual-not reading material  Hands on  Learning Readiness:  Ready   MEDICATIONS:    DIETARY INTAKE:  Eating 1 meal per day. Doesn't get hungry.   Usual physical activity: ADL  Estimated energy needs: 1200 calories 135 g carbohydrates 90 g protein 33 g fat  Progress Towards Goal(s):  In progress.   Nutritional Diagnosis:  NB-1.1 Food and nutrition-related knowledge deficit As related to Diabetes Type 2.  As evidenced by A1C 11%.    Intervention:  Nutrition and Diabetes education provided on My Plate, CHO counting, meal planning, portion sizes, timing of meals, avoiding snacks between meals unless having a low blood sugar, target ranges for A1C and blood sugars, signs/symptoms and treatment of hyper/hypoglycemia, monitoring blood sugars, taking medications as prescribed, benefits of exercising 30 minutes per day and prevention of complications of DM. Marland Kitchen Goals 1. Follow MY Plate 2. Eat three meals per day 3. Drink only water 4 Take insulin and meds as prescribed. Test blood sugar 4 times a day and bring results next visit.   Teaching Method Utilized:  Visual Auditory Hands on  Handouts given during visit include:  The Plate Method   Barriers to learning/adherence to lifestyle change: none  Demonstrated degree of understanding via:  Teach Back    Monitoring/Evaluation:  Dietary intake, exercise,  and body weight in 2 week(s).

## 2018-04-23 NOTE — Patient Instructions (Signed)
Goals 1. Follow MY Plate 2. Eat three meals per day 3. Drink only water 4 Take insulin and meds as prescribed. Test blood sugar 4 times a day and bring results next visit.

## 2018-04-23 NOTE — Patient Instructions (Signed)

## 2018-04-24 ENCOUNTER — Other Ambulatory Visit: Payer: Self-pay

## 2018-04-24 MED ORDER — ACCU-CHEK FASTCLIX LANCETS MISC: 1.0000 | Freq: Four times a day (QID) | 5 refills | Status: DC

## 2018-04-24 NOTE — Progress Notes (Signed)
Endocrinology follow up  Note       04/24/2018, 9:40 AM   Subjective:    Patient ID: Abigail Jackson, female    DOB: [birth year].  Abigail Jackson is being seen in follow up for management of currently uncontrolled symptomatic type 2 diabetes, hyperlipidemia, hypertension.  PMD: Shade Flood, MD.   Past Medical History:  Diagnosis Date  . Arthritis   . Diabetes mellitus without complication ([place])   . Heart murmur   . Hyperlipidemia   . Hypertension   . Irregular bowel habits   . Renal neoplasm    LEFT   Past Surgical History:  Procedure Laterality Date  . ROBOTIC ASSITED PARTIAL NEPHRECTOMY Left 08/03/2015   Procedure: ROBOTIC ASSISTED PARTIAL LEFT NEPHRECTOMY;  Surgeon: Raynelle Bring, MD;  Location: [facility];  Service: Urology;  Laterality: Left;  . SHOULDER SURGERY  2006   RIGHT   Social History   Socioeconomic History  . Marital status: Married    Spouse name: Not on file  . Number of children: Not on file  . Years of education: Not on file  . Highest education level: Not on file  Occupational History  . Not on file  Social Needs  . Financial resource strain: Not on file  . Food insecurity:    Worry: Not on file    Inability: Not on file  . Transportation needs:    Medical: Not on file    Non-medical: Not on file  Tobacco Use  . Smoking status: Never Smoker  . Smokeless tobacco: Never Used  Substance and Sexual Activity  . Alcohol use: No  . Drug use: No  . Sexual activity: Not on file  Lifestyle  . Physical activity:    Days per week: Not on file    Minutes per session: Not on file  . Stress: Not on file  Relationships  . Social connections:    Talks on phone: Not on file    Gets together: Not on file    Attends religious service: Not on file    Active member of club or organization: Not on file    Attends meetings of clubs or organizations: Not on file     Relationship status: Not on file  Other Topics Concern  . Not on file  Social History Narrative  . Not on file   Outpatient Encounter Medications as of 04/23/2018  Medication Sig  . acetaminophen-codeine (TYLENOL #3) 300-30 MG tablet Take 1 tablet by mouth 3 (three) times daily as needed for pain.  Marland Kitchen amitriptyline (ELAVIL) 100 MG tablet Take 100 mg by mouth at bedtime.   Marland Kitchen aspirin EC 81 MG tablet Take 81 mg by mouth daily.  . cholecalciferol (VITAMIN D) 400 units TABS tablet Take 400 Units by mouth.  . Ferrous Sulfate (IRON) 325 (65 Fe) MG TABS Take by mouth.  Marland Kitchen glipiZIDE (GLUCOTROL) 5 MG tablet Take 1 tablet (5 mg total) by mouth 2 (two) times daily with a meal.  . hydrochlorothiazide (HYDRODIURIL) 25 MG tablet Take 25 mg by mouth daily.  Marland Kitchen linagliptin (TRADJENTA) 5 MG TABS tablet Take 5 mg by  mouth daily.  Marland Kitchen lisinopril (PRINIVIL,ZESTRIL) 10 MG tablet Take 10 mg by mouth daily.  . metoprolol succinate (TOPROL-XL) 50 MG 24 hr tablet Take 1 tablet (50 mg total) by mouth daily. Take with or immediately following a meal.  . omeprazole (PRILOSEC) 40 MG capsule Take 40 mg by mouth at bedtime.  . pravastatin (PRAVACHOL) 40 MG tablet Take 40 mg by mouth at bedtime.  . insulin degludec (TRESIBA FLEXTOUCH) 100 UNIT/ML SOPN FlexTouch Pen Inject 0.2 mLs (20 Units total) into the skin daily.  . Insulin Pen Needle (B-D ULTRAFINE III SHORT PEN) 31G X 8 MM MISC 1 each by Does not apply route as directed.  . [DISCONTINUED] glucose blood test strip 1 each by Other route as needed. Use as instructed  . [DISCONTINUED] glucose blood test strip 1 each by Other route 2 (two) times daily. Use as instructed  . [DISCONTINUED] Lancets Misc. MISC 1 each by Does not apply route 2 (two) times daily.  . [DISCONTINUED] LYRICA 300 MG capsule Take 300 mg by mouth 2 (two) times daily.  . [DISCONTINUED] ranitidine (ZANTAC) 150 MG tablet daily.   No facility-administered encounter medications on file as of 04/23/2018.      ALLERGIES: Allergies  Allergen Reactions  . Tramadol Itching    VACCINATION STATUS:  There is no immunization history on file for this patient.  Diabetes  She presents for her follow-up diabetic visit. She has type 2 diabetes mellitus. Onset time: She was diagnosed at approximate age of 68 years. Her disease course has been improving. There are no hypoglycemic associated symptoms. Pertinent negatives for hypoglycemia include no confusion, headaches, pallor or seizures. Associated symptoms include blurred vision, fatigue, polydipsia and polyuria. Pertinent negatives for diabetes include no chest pain and no polyphagia. There are no hypoglycemic complications. Symptoms are improving. Diabetic complications include nephropathy. Risk factors for coronary artery disease include dyslipidemia, diabetes mellitus, family history, hypertension, obesity, sedentary lifestyle, post-menopausal and tobacco exposure. Current diabetic treatments: She is on metformin 500 mg p.o. twice daily, Tradjenta 5 mg p.o. daily. Her weight is increasing steadily. She is following a generally unhealthy diet. When asked about meal planning, she reported none. She has not had a previous visit with a dietitian. She never participates in exercise. Her home blood glucose trend is increasing steadily. Her breakfast blood glucose range is generally >200 mg/dl. Her bedtime blood glucose range is generally >200 mg/dl. Her overall blood glucose range is >200 mg/dl. An ACE inhibitor/angiotensin II receptor blocker is being taken. She does not see a podiatrist.Eye exam is current.  Hyperlipidemia  This is a chronic problem. The current episode started more than 1 year ago. Exacerbating diseases include chronic renal disease, diabetes and obesity. Pertinent negatives include no chest pain, myalgias or shortness of breath. Current antihyperlipidemic treatment includes statins. Risk factors for coronary artery disease include dyslipidemia,  diabetes mellitus, hypertension, obesity, post-menopausal, a sedentary lifestyle and family history.  Hypertension  This is a chronic problem. The current episode started more than 1 year ago. The problem is controlled. Associated symptoms include blurred vision. Pertinent negatives include no chest pain, headaches, palpitations or shortness of breath. Risk factors for coronary artery disease include dyslipidemia, diabetes mellitus, obesity, sedentary lifestyle, smoking/tobacco exposure, post-menopausal state and family history. Past treatments include ACE inhibitors. Identifiable causes of hypertension include chronic renal disease.    Review of Systems  Constitutional: Positive for fatigue. Negative for chills, fever and unexpected weight change.  HENT: Negative for trouble swallowing and voice change.  Eyes: Positive for blurred vision. Negative for visual disturbance.  Respiratory: Negative for cough, shortness of breath and wheezing.   Cardiovascular: Negative for chest pain, palpitations and leg swelling.  Gastrointestinal: Negative for diarrhea, nausea and vomiting.  Endocrine: Positive for polydipsia and polyuria. Negative for cold intolerance, heat intolerance and polyphagia.  Genitourinary:       She reports that she was diagnosed with renal cell carcinoma 3 years ago for which she underwent left nephrectomy.  Musculoskeletal: Negative for arthralgias and myalgias.  Skin: Negative for color change, pallor, rash and wound.  Neurological: Negative for seizures and headaches.  Psychiatric/Behavioral: Negative for confusion and suicidal ideas.    Objective:    BP (!) 150/72   Pulse 78   Ht 5\' 3"  (1.6 m)   Wt 260 lb (117.9 kg)   BMI 46.06 kg/m   Wt Readings from Last 3 Encounters:  04/23/18 260 lb (117.9 kg)  12/26/17 263 lb 6.4 oz (119.5 kg)  10/14/17 260 lb (117.9 kg)     Physical Exam  Constitutional: She is oriented to person, place, and time. She appears well-developed.   HENT:  Head: Normocephalic and atraumatic.  Eyes: EOM are normal.  Neck: Normal range of motion. Neck supple. No tracheal deviation present. No thyromegaly present.  Cardiovascular: Normal rate.  Pulmonary/Chest: Effort normal.  Abdominal: There is no tenderness. There is no guarding.  Musculoskeletal: Normal range of motion. She exhibits edema.  Neurological: She is alert and oriented to person, place, and time. No cranial nerve deficit. Coordination normal.  Skin: Skin is warm and dry. No rash noted. No erythema. No pallor.  Psychiatric: She has a normal mood and affect.  Reluctant affect.     CMP     Component Value Date/Time   NA 145 04/13/2018 1113   K 4.5 04/13/2018 1113   CL 104 04/13/2018 1113   CO2 30 04/13/2018 1113   GLUCOSE 132 (H) 04/13/2018 1113   BUN 17 04/13/2018 1113   CREATININE 1.46 (H) 04/13/2018 1113   CALCIUM 10.4 04/13/2018 1113   PROT 7.7 04/13/2018 1113   AST 25 04/13/2018 1113   ALT 21 04/13/2018 1113   BILITOT 0.4 04/13/2018 1113   GFRNONAA 41 (L) 04/13/2018 1113   GFRAA 47 (L) 04/13/2018 1113     Diabetic Labs (most recent): Lab Results  Component Value Date   HGBA1C 11.1 (H) 04/13/2018   HGBA1C 10.2 (H) 12/10/2017   HGBA1C 14 09/10/2017     Lab Results  Component Value Date   TSH 2.547 08/06/2015    Lipid Panel     Component Value Date/Time   CHOL 194 12/10/2017 0844   TRIG 165 (H) 12/10/2017 0844   HDL 42 (L) 12/10/2017 0844   CHOLHDL 4.6 12/10/2017 0844   LDLCALC 124 (H) 12/10/2017 0844     Assessment & Plan:   1. Type 2 diabetes mellitus with stage 3 chronic kidney disease, CAD.   ZAMYIAH TINO has currently uncontrolled symptomatic type 2 DM since 52 years of age. -She came with loss of control with A1c of 11.1%.  She had A1c as high as 14% last year.    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 These are all discussed in detail with the patient.  - I have counseled  her on diet management and weight loss, by adopting a carbohydrate restricted/protein rich diet.  -She admits to dietary indiscretions.  -  Suggestion is made for her to avoid simple carbohydrates  from her diet including Cakes, Sweet Desserts / Pastries, Ice Cream, Soda (diet and regular), Sweet Tea, Candies, Chips, Cookies, Store Bought Juices, Alcohol in Excess of  1-2 drinks a day, Artificial Sweeteners, and "Sugar-free" Products. This will help patient to have stable blood glucose profile and potentially avoid unintended weight gain.   - I encouraged her to switch to  unprocessed or minimally processed complex starch and increased protein intake (animal or plant source), fruits, and vegetables.  - she is advised to stick to a routine mealtimes to eat 3 meals  a day and avoid unnecessary snacks ( to snack only to correct hypoglycemia).   - she is following with  Jearld Fenton, RDN, CDE for individualized diabetes education.  - I have approached her with the following individualized plan to manage diabetes and patient agrees:   -She has very limited options of medications to treat her diabetes to target, she is reluctantly accepting insulin treatment.   -I discussed and initiated Tresiba 20 units nightly associated with strict monitoring of blood glucose 4 times a day-before meals and at bedtime and return in 10 days with her meter and logs for reevaluation. -She is advised to continue Tradjenta 5 mg p.o. daily, as well as glipizide 5 mg twice a day with meals.  -Patient is encouraged to call clinic for blood glucose levels less than 70 or above 300 mg /dl.  - Patient specific target  A1c;  LDL, HDL, Triglycerides, and  Waist Circumference were discussed in detail.  2) BP/HTN: Her blood  pressure is not controlled to target.  Admits to inconsistent intake of her blood pressure medications.  She is advised to be consistent taking her current blood pressure medications including metoprolol 50 mg p.o. daily, lisinopril 10 mg p.o. daily, hydrochlorothiazide 25 mg p.o. daily, as well as salt restrictions.    3) Lipids/HPL:  Her recent lipids show uncontrolled LDL at 124.  She is advised to continue her pravastatin 40 mg p.o. nightly.    4)  Weight/Diet: CDE Consult has been  initiated , exercise, and detailed carbohydrates information provided.  5) Chronic Care/Health Maintenance:  -she  is on ACEI/ARB and Statin medications and  is encouraged to continue to follow up with Ophthalmology, Dentist,  Podiatrist at least yearly or according to recommendations, and advised to  stay away from smoking. I have recommended yearly flu vaccine and pneumonia vaccination at least every 5 years; moderate intensity exercise for up to 150 minutes weekly; and  sleep for at least 7 hours a day.  - I advised patient to maintain close follow up with Shade Flood, MD for primary care needs.  - Time spent with the patient: 25 min, of which >50% was spent in reviewing her blood glucose logs , discussing her hypo- and hyper-glycemic episodes, reviewing her current and  previous labs and insulin doses and developing a plan to avoid hypo- and hyper-glycemia. Please refer to Patient Instructions for Blood Glucose Monitoring and Insulin/Medications Dosing Guide"  in media tab for additional information. Abigail Jackson participated in the discussions, expressed understanding, and voiced agreement with the above plans.  All questions were answered to her satisfaction. she is encouraged to contact clinic should she have any questions or concerns prior to her return visit.   Follow up  plan: - Return in about 10 days (around 05/03/2018) for Follow up with Meter and Logs Only - no Labs.  Glade Lloyd, MD [facility] [address] [place], [place] [address] Phone: [phone]  Fax: [phone]    04/24/2018, 9:40 AM  This note was partially dictated with voice recognition software. Similar sounding words can be transcribed inadequately or may not  be corrected upon review.

## 2018-04-30 ENCOUNTER — Other Ambulatory Visit: Payer: Self-pay

## 2018-04-30 MED ORDER — GLUCOSE BLOOD VI STRP: 1.0000 | ORAL_STRIP | Freq: Four times a day (QID) | 5 refills | Status: DC

## 2018-05-05 ENCOUNTER — Ambulatory Visit: Payer: Medicare Other | Admitting: "Endocrinology

## 2018-05-05 ENCOUNTER — Ambulatory Visit: Payer: Medicare Other | Admitting: Nutrition

## 2018-05-12 ENCOUNTER — Other Ambulatory Visit: Payer: Self-pay | Admitting: "Endocrinology

## 2018-05-12 MED ORDER — GLUCOSE BLOOD VI STRP: 1.0000 | ORAL_STRIP | Freq: Four times a day (QID) | 5 refills | Status: DC

## 2018-06-08 ENCOUNTER — Ambulatory Visit (INDEPENDENT_AMBULATORY_CARE_PROVIDER_SITE_OTHER): Payer: Medicare Other | Admitting: "Endocrinology

## 2018-06-08 ENCOUNTER — Encounter: Payer: Self-pay | Admitting: "Endocrinology

## 2018-06-08 VITALS — BP 134/77 | HR 82 | Ht 63.0 in | Wt 257.0 lb

## 2018-06-08 DIAGNOSIS — I10 Essential (primary) hypertension: Secondary | ICD-10-CM

## 2018-06-08 DIAGNOSIS — E1122 Type 2 diabetes mellitus with diabetic chronic kidney disease: Secondary | ICD-10-CM

## 2018-06-08 DIAGNOSIS — E782 Mixed hyperlipidemia: Secondary | ICD-10-CM

## 2018-06-08 DIAGNOSIS — R635 Abnormal weight gain: Secondary | ICD-10-CM

## 2018-06-08 DIAGNOSIS — N183 Chronic kidney disease, stage 3 (moderate): Secondary | ICD-10-CM

## 2018-06-08 NOTE — Patient Instructions (Signed)

## 2018-06-08 NOTE — Progress Notes (Signed)
Endocrinology follow up  Note       06/08/2018, 1:02 PM   Subjective:    Patient ID: Abigail Jackson, female    DOB: [birth year].  Abigail Jackson is being seen in follow up for management of currently uncontrolled symptomatic type 2 diabetes, hyperlipidemia, hypertension.  PMD: Shade Flood, MD.   Past Medical History:  Diagnosis Date  . Arthritis   . Diabetes mellitus without complication ([facility])   . Heart murmur   . Hyperlipidemia   . Hypertension   . Irregular bowel habits   . Renal neoplasm    LEFT   Past Surgical History:  Procedure Laterality Date  . ROBOTIC ASSITED PARTIAL NEPHRECTOMY Left 08/03/2015   Procedure: ROBOTIC ASSISTED PARTIAL LEFT NEPHRECTOMY;  Surgeon: Raynelle Bring, MD;  Location: [facility];  Service: Urology;  Laterality: Left;  . SHOULDER SURGERY  2006   RIGHT   Social History   Socioeconomic History  . Marital status: Married    Spouse name: Not on file  . Number of children: Not on file  . Years of education: Not on file  . Highest education level: Not on file  Occupational History  . Not on file  Social Needs  . Financial resource strain: Not on file  . Food insecurity:    Worry: Not on file    Inability: Not on file  . Transportation needs:    Medical: Not on file    Non-medical: Not on file  Tobacco Use  . Smoking status: Never Smoker  . Smokeless tobacco: Never Used  Substance and Sexual Activity  . Alcohol use: No  . Drug use: No  . Sexual activity: Not on file  Lifestyle  . Physical activity:    Days per week: Not on file    Minutes per session: Not on file  . Stress: Not on file  Relationships  . Social connections:    Talks on phone: Not on file    Gets together: Not on file    Attends religious service: Not on file    Active member of club or organization: Not on file    Attends meetings of clubs or organizations: Not on file   Relationship status: Not on file  Other Topics Concern  . Not on file  Social History Narrative  . Not on file   Outpatient Encounter Medications as of 06/08/2018  Medication Sig  . ACCU-CHEK FASTCLIX LANCETS MISC 1 each by Other route 4 (four) times daily.  Marland Kitchen acetaminophen-codeine (TYLENOL #3) 300-30 MG tablet Take 1 tablet by mouth 3 (three) times daily as needed for pain.  Marland Kitchen amitriptyline (ELAVIL) 100 MG tablet Take 100 mg by mouth at bedtime.   Marland Kitchen aspirin EC 81 MG tablet Take 81 mg by mouth daily.  . cholecalciferol (VITAMIN D) 400 units TABS tablet Take 400 Units by mouth.  . Ferrous Sulfate (IRON) 325 (65 Fe) MG TABS Take by mouth.  Marland Kitchen glipiZIDE (GLUCOTROL) 5 MG tablet Take 1 tablet (5 mg total) by mouth 2 (two) times daily with a meal.  . glucose blood test strip 1 each by Other route 4 (  four) times daily. Use as instructed 4 x daily. E11.65  . hydrochlorothiazide (HYDRODIURIL) 25 MG tablet Take 25 mg by mouth daily.  . insulin degludec (TRESIBA FLEXTOUCH) 100 UNIT/ML SOPN FlexTouch Pen Inject 0.2 mLs (20 Units total) into the skin daily.  . Insulin Pen Needle (B-D ULTRAFINE III SHORT PEN) 31G X 8 MM MISC 1 each by Does not apply route as directed.  . linagliptin (TRADJENTA) 5 MG TABS tablet Take 5 mg by mouth daily.  Marland Kitchen lisinopril (PRINIVIL,ZESTRIL) 10 MG tablet Take 10 mg by mouth daily.  . metoprolol succinate (TOPROL-XL) 50 MG 24 hr tablet Take 1 tablet (50 mg total) by mouth daily. Take with or immediately following a meal.  . omeprazole (PRILOSEC) 40 MG capsule Take 40 mg by mouth at bedtime.  . pravastatin (PRAVACHOL) 40 MG tablet Take 40 mg by mouth at bedtime.   No facility-administered encounter medications on file as of 06/08/2018.     ALLERGIES: Allergies  Allergen Reactions  . Tramadol Itching    VACCINATION STATUS:  There is no immunization history on file for this patient.  Diabetes  She presents for her follow-up diabetic visit. She has type 2 diabetes  mellitus. Onset time: She was diagnosed at approximate age of 52 years. Her disease course has been improving. There are no hypoglycemic associated symptoms. Pertinent negatives for hypoglycemia include no confusion, headaches, pallor or seizures. Associated symptoms include blurred vision, fatigue, polydipsia and polyuria. Pertinent negatives for diabetes include no chest pain and no polyphagia. There are no hypoglycemic complications. Symptoms are improving. Diabetic complications include nephropathy. Risk factors for coronary artery disease include dyslipidemia, diabetes mellitus, family history, hypertension, obesity, sedentary lifestyle, post-menopausal and tobacco exposure. Current diabetic treatments: She is on metformin 500 mg p.o. twice daily, Tradjenta 5 mg p.o. daily and Tresiba 20 units nightly. Her weight is increasing steadily. She is following a generally unhealthy diet. When asked about meal planning, she reported none. She has not had a previous visit with a dietitian. She never participates in exercise. Her home blood glucose trend is increasing steadily. Her breakfast blood glucose range is generally 140-180 mg/dl. Her lunch blood glucose range is generally 140-180 mg/dl. Her dinner blood glucose range is generally 140-180 mg/dl. Her bedtime blood glucose range is generally 140-180 mg/dl. Her overall blood glucose range is 140-180 mg/dl. An ACE inhibitor/angiotensin II receptor blocker is being taken. She does not see a podiatrist.Eye exam is current.  Hyperlipidemia  This is a chronic problem. The current episode started more than 1 year ago. Exacerbating diseases include chronic renal disease, diabetes and obesity. Pertinent negatives include no chest pain, myalgias or shortness of breath. Current antihyperlipidemic treatment includes statins. Risk factors for coronary artery disease include dyslipidemia, diabetes mellitus, hypertension, obesity, post-menopausal, a sedentary lifestyle and  family history.  Hypertension  This is a chronic problem. The current episode started more than 1 year ago. The problem is controlled. Associated symptoms include blurred vision. Pertinent negatives include no chest pain, headaches, palpitations or shortness of breath. Risk factors for coronary artery disease include dyslipidemia, diabetes mellitus, obesity, sedentary lifestyle, smoking/tobacco exposure, post-menopausal state and family history. Past treatments include ACE inhibitors. Identifiable causes of hypertension include chronic renal disease.    Review of Systems  Constitutional: Positive for fatigue. Negative for chills, fever and unexpected weight change.  HENT: Negative for trouble swallowing and voice change.   Eyes: Positive for blurred vision. Negative for visual disturbance.  Respiratory: Negative for cough, shortness of breath and  wheezing.   Cardiovascular: Negative for chest pain, palpitations and leg swelling.  Gastrointestinal: Negative for diarrhea, nausea and vomiting.  Endocrine: Positive for polydipsia and polyuria. Negative for cold intolerance, heat intolerance and polyphagia.  Genitourinary:       She reports that she was diagnosed with renal cell carcinoma 3 years ago for which she underwent left nephrectomy.  Musculoskeletal: Negative for arthralgias and myalgias.  Skin: Negative for color change, pallor, rash and wound.  Neurological: Negative for seizures and headaches.  Psychiatric/Behavioral: Negative for confusion and suicidal ideas.    Objective:    BP 134/77   Pulse 82   Ht 5\' 3"  (1.6 m)   Wt 257 lb (116.6 kg)   BMI 45.53 kg/m   Wt Readings from Last 3 Encounters:  06/08/18 257 lb (116.6 kg)  04/23/18 260 lb (117.9 kg)  12/26/17 263 lb 6.4 oz (119.5 kg)     Physical Exam  Constitutional: She is oriented to person, place, and time. She appears well-developed.  HENT:  Head: Normocephalic and atraumatic.  Eyes: EOM are normal.  Neck: Normal  range of motion. Neck supple. No tracheal deviation present. No thyromegaly present.  Cardiovascular: Normal rate.  Pulmonary/Chest: Effort normal.  Abdominal: There is no tenderness. There is no guarding.  Musculoskeletal: Normal range of motion. She exhibits edema.  Neurological: She is alert and oriented to person, place, and time. No cranial nerve deficit. Coordination normal.  Skin: Skin is warm and dry. No rash noted. No erythema. No pallor.  Psychiatric: She has a normal mood and affect.  Reluctant affect.     CMP     Component Value Date/Time   NA 145 04/13/2018 1113   K 4.5 04/13/2018 1113   CL 104 04/13/2018 1113   CO2 30 04/13/2018 1113   GLUCOSE 132 (H) 04/13/2018 1113   BUN 17 04/13/2018 1113   CREATININE 1.46 (H) 04/13/2018 1113   CALCIUM 10.4 04/13/2018 1113   PROT 7.7 04/13/2018 1113   AST 25 04/13/2018 1113   ALT 21 04/13/2018 1113   BILITOT 0.4 04/13/2018 1113   GFRNONAA 41 (L) 04/13/2018 1113   GFRAA 47 (L) 04/13/2018 1113     Diabetic Labs (most recent): Lab Results  Component Value Date   HGBA1C 11.1 (H) 04/13/2018   HGBA1C 10.2 (H) 12/10/2017   HGBA1C 14 09/10/2017     Lab Results  Component Value Date   TSH 2.547 08/06/2015    Lipid Panel     Component Value Date/Time   CHOL 194 12/10/2017 0844   TRIG 165 (H) 12/10/2017 0844   HDL 42 (L) 12/10/2017 0844   CHOLHDL 4.6 12/10/2017 0844   LDLCALC 124 (H) 12/10/2017 0844     Assessment & Plan:   1. Type 2 diabetes mellitus with stage 3 chronic kidney disease, CAD.   Abigail Jackson has currently uncontrolled symptomatic type 2 DM since 52 years of age. -She came with significantly improving average blood glucose profile of 127 over the last 30 days despite her recent A1c of 11.1%, overall improving from 14% last year.     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 These are all discussed in detail with the patient.  - I have counseled her on diet management and weight loss, by adopting a  carbohydrate restricted/protein rich diet.  -She admits to dietary indiscretions including consumption of sweetened beverages.  -  Suggestion is made for her to avoid simple carbohydrates  from her diet including Cakes, Sweet Desserts / Pastries, Ice Cream, Soda (diet and regular), Sweet Tea, Candies, Chips, Cookies, Store Bought Juices, Alcohol in Excess of  1-2 drinks a day, Artificial Sweeteners, and "Sugar-free" Products. This will help patient to have stable blood glucose profile and potentially avoid unintended weight gain.  - I encouraged her to switch to  unprocessed or minimally processed complex starch and increased protein intake (animal or plant source), fruits, and vegetables.  - she is advised to stick to a routine mealtimes to eat 3 meals  a day and avoid unnecessary snacks ( to snack only to correct hypoglycemia).   - she is following with  Abigail Jackson, RDN, CDE for individualized diabetes education.  - I have approached her with the following individualized plan to manage diabetes and patient agrees:   -She has responded to initiation of basal insulin with controlled average blood glucose profile. -She is advised to continue Tresiba 20 units nightly, associated with strict monitoring of blood glucose 2 times a day-daily before breakfast and at bedtime  -She is advised to continue Tradjenta 5 mg p.o. daily, as well as glipizide 5 mg twice a day with meals.  -Patient is encouraged to call clinic for blood glucose levels less than 70 or above 300 mg /dl.  - Patient specific target  A1c;  LDL, HDL, Triglycerides, and  Waist Circumference were discussed in detail.  2) BP/HTN: Her blood pressure is controlled to target.  She reports  better consistency of taking her medications this time.   She is advised to continue her current blood pressure medications including metoprolol 50 mg p.o. daily, lisinopril 10 mg p.o. daily, hydrochlorothiazide 25 mg p.o. daily, as well as salt restrictions.    3) Lipids/HPL:  Her recent lipids show uncontrolled LDL at 124.  She is advised to continue pravastatin 40 mg p.o. nightly.    4)  Weight/Diet: CDE Consult has been  initiated , exercise, and detailed carbohydrates information provided.  5) Chronic Care/Health Maintenance:  -she  is on ACEI/ARB and Statin medications and  is encouraged to continue to follow up with Ophthalmology, Dentist,  Podiatrist at least yearly or according to recommendations, and advised to  stay away from smoking. I have recommended yearly flu vaccine and pneumonia vaccination at least every 5 years; moderate intensity exercise for up to 150 minutes weekly; and  sleep for at least 7 hours a day.  - I advised patient to maintain close follow up with Shade Flood, MD for primary care needs.  - Time spent with the patient: 25 min, of which >50% was spent in reviewing her blood glucose logs , discussing her hypo- and hyper-glycemic episodes, reviewing her current and  previous labs and insulin doses and developing a plan to avoid hypo- and hyper-glycemia. Please refer to Patient Instructions for Blood Glucose Monitoring and Insulin/Medications Dosing Guide"  in media tab for additional information. Abigail Jackson participated in the discussions, expressed understanding, and voiced agreement with the above plans.  All questions were answered to her satisfaction. she is encouraged to contact clinic should she have any questions or concerns prior to her return visit.   Follow up plan: - Return in about 3 months (around 09/08/2018) for Follow up with Pre-visit Labs, Meter, and Logs.  Glade Lloyd, MD [facility] [address] [place], [place] [address] Phone: [phone]  Fax: [phone]    06/08/2018, 1:02 PM  This note was partially dictated with voice recognition software. Similar sounding words can be transcribed inadequately or may not  be corrected upon review.

## 2018-06-23 ENCOUNTER — Other Ambulatory Visit: Payer: Self-pay

## 2018-06-23 MED ORDER — INSULIN DEGLUDEC 100 UNIT/ML ~~LOC~~ SOPN: 20.0000 [IU] | PEN_INJECTOR | Freq: Every day | SUBCUTANEOUS | 2 refills | Status: DC

## 2018-06-24 ENCOUNTER — Other Ambulatory Visit: Payer: Self-pay

## 2018-06-24 ENCOUNTER — Telehealth: Payer: Self-pay | Admitting: "Endocrinology

## 2018-06-24 MED ORDER — INSULIN PEN NEEDLE 31G X 8 MM MISC: 1.0000 | 3 refills | Status: DC

## 2018-06-24 NOTE — Telephone Encounter (Signed)
OPENED IN ERROR

## 2018-08-20 ENCOUNTER — Other Ambulatory Visit: Payer: Self-pay

## 2018-08-20 DIAGNOSIS — E1122 Type 2 diabetes mellitus with diabetic chronic kidney disease: Secondary | ICD-10-CM

## 2018-08-20 DIAGNOSIS — N183 Chronic kidney disease, stage 3 (moderate): Principal | ICD-10-CM

## 2018-08-20 MED ORDER — INSULIN DEGLUDEC 100 UNIT/ML ~~LOC~~ SOPN: 20.0000 [IU] | PEN_INJECTOR | Freq: Every day | SUBCUTANEOUS | 2 refills | Status: DC

## 2018-08-20 MED ORDER — ACCU-CHEK FASTCLIX LANCETS MISC: 1.0000 | Freq: Four times a day (QID) | 5 refills | Status: DC

## 2018-08-20 MED ORDER — GLUCOSE BLOOD VI STRP: 1.0000 | ORAL_STRIP | Freq: Four times a day (QID) | 5 refills | Status: DC

## 2018-08-28 ENCOUNTER — Other Ambulatory Visit: Payer: Self-pay

## 2018-08-31 ENCOUNTER — Other Ambulatory Visit: Payer: Self-pay

## 2018-08-31 MED ORDER — BLOOD GLUCOSE MONITOR KIT: PACK | 5 refills | Status: DC

## 2018-09-09 ENCOUNTER — Ambulatory Visit: Payer: Medicare Other | Admitting: "Endocrinology

## 2018-09-16 ENCOUNTER — Ambulatory Visit: Payer: Medicare Other | Admitting: "Endocrinology

## 2020-08-30 LAB — TSH: TSH: 1.96 (ref 0.41–5.90)

## 2020-12-26 LAB — CBC AND DIFFERENTIAL
HCT: 37 (ref 36–46)
Hemoglobin: 12.2 (ref 12.0–16.0)

## 2020-12-26 LAB — BASIC METABOLIC PANEL
BUN: 15 (ref 4–21)
Creatinine: 1.6 — AB (ref 0.5–1.1)
Glucose: 70

## 2020-12-26 LAB — COMPREHENSIVE METABOLIC PANEL: GFR calc Af Amer: 39

## 2020-12-26 LAB — HEMOGLOBIN A1C: Hemoglobin A1C: 7.2

## 2021-01-18 ENCOUNTER — Ambulatory Visit (INDEPENDENT_AMBULATORY_CARE_PROVIDER_SITE_OTHER): Payer: Medicare (Managed Care) | Admitting: Nurse Practitioner

## 2021-01-18 ENCOUNTER — Encounter: Payer: Self-pay | Admitting: Nurse Practitioner

## 2021-01-18 ENCOUNTER — Other Ambulatory Visit: Payer: Self-pay

## 2021-01-18 VITALS — BP 121/74 | HR 69 | Ht 63.0 in | Wt 245.8 lb

## 2021-01-18 DIAGNOSIS — I10 Essential (primary) hypertension: Secondary | ICD-10-CM | POA: Diagnosis not present

## 2021-01-18 DIAGNOSIS — N1832 Chronic kidney disease, stage 3b: Secondary | ICD-10-CM

## 2021-01-18 DIAGNOSIS — E1122 Type 2 diabetes mellitus with diabetic chronic kidney disease: Secondary | ICD-10-CM | POA: Diagnosis not present

## 2021-01-18 DIAGNOSIS — E782 Mixed hyperlipidemia: Secondary | ICD-10-CM

## 2021-01-18 MED ORDER — DEXCOM G6 TRANSMITTER MISC: 1 refills | Status: DC

## 2021-01-18 MED ORDER — DEXCOM G6 SENSOR MISC: 3 refills | Status: DC

## 2021-01-18 NOTE — Patient Instructions (Signed)
Diabetes Mellitus and Nutrition, Adult When you have diabetes, or diabetes mellitus, it is very important to have healthy eating habits because your blood sugar (glucose) levels are greatly affected by what you eat and drink. Eating healthy foods in the right amounts, at about the same times every day, can help you:  Control your blood glucose.  Lower your risk of heart disease.  Improve your blood pressure.  Reach or maintain a healthy weight. What can affect my meal plan? Every person with diabetes is different, and each person has different needs for a meal plan. Your health care provider may recommend that you work with a dietitian to make a meal plan that is best for you. Your meal plan may vary depending on factors such as:  The calories you need.  The medicines you take.  Your weight.  Your blood glucose, blood pressure, and cholesterol levels.  Your activity level.  Other health conditions you have, such as heart or kidney disease. How do carbohydrates affect me? Carbohydrates, also called carbs, affect your blood glucose level more than any other type of food. Eating carbs naturally raises the amount of glucose in your blood. Carb counting is a method for keeping track of how many carbs you eat. Counting carbs is important to keep your blood glucose at a healthy level, especially if you use insulin or take certain oral diabetes medicines. It is important to know how many carbs you can safely have in each meal. This is different for every person. Your dietitian can help you calculate how many carbs you should have at each meal and for each snack. How does alcohol affect me? Alcohol can cause a sudden decrease in blood glucose (hypoglycemia), especially if you use insulin or take certain oral diabetes medicines. Hypoglycemia can be a life-threatening condition. Symptoms of hypoglycemia, such as sleepiness, dizziness, and confusion, are similar to symptoms of having too much  alcohol.  Do not drink alcohol if: ? Your health care provider tells you not to drink. ? You are pregnant, may be pregnant, or are planning to become pregnant.  If you drink alcohol: ? Do not drink on an empty stomach. ? Limit how much you use to:  0-1 drink a day for women.  0-2 drinks a day for men. ? Be aware of how much alcohol is in your drink. In the [place] one drink equals one 12 oz bottle of beer (355 mL), one 5 oz glass of wine (148 mL), or one 1 oz glass of hard liquor (44 mL). ? Keep yourself hydrated with water, diet soda, or unsweetened iced tea.  Keep in mind that regular soda, juice, and other mixers may contain a lot of sugar and must be counted as carbs. What are tips for following this plan? Reading food labels  Start by checking the serving size on the "Nutrition Facts" label of packaged foods and drinks. The amount of calories, carbs, fats, and other nutrients listed on the label is based on one serving of the item. Many items contain more than one serving per package.  Check the total grams (g) of carbs in one serving. You can calculate the number of servings of carbs in one serving by dividing the total carbs by 15. For example, if a food has 30 g of total carbs per serving, it would be equal to 2 servings of carbs.  Check the number of grams (g) of saturated fats and trans fats in one serving. Choose foods that have   a low amount or none of these fats.  Check the number of milligrams (mg) of salt (sodium) in one serving. Most people should limit total sodium intake to less than 2,300 mg per day.  Always check the nutrition information of foods labeled as "low-fat" or "nonfat." These foods may be higher in added sugar or refined carbs and should be avoided.  Talk to your dietitian to identify your daily goals for nutrients listed on the label. Shopping  Avoid buying canned, pre-made, or processed foods. These foods tend to be high in fat, sodium, and added  sugar.  Shop around the outside edge of the grocery store. This is where you will most often find fresh fruits and vegetables, bulk grains, fresh meats, and fresh dairy. Cooking  Use low-heat cooking methods, such as baking, instead of high-heat cooking methods like deep frying.  Cook using healthy oils, such as olive, canola, or sunflower oil.  Avoid cooking with butter, cream, or high-fat meats. Meal planning  Eat meals and snacks regularly, preferably at the same times every day. Avoid going long periods of time without eating.  Eat foods that are high in fiber, such as fresh fruits, vegetables, beans, and whole grains. Talk with your dietitian about how many servings of carbs you can eat at each meal.  Eat 4-6 oz (112-168 g) of lean protein each day, such as lean meat, chicken, fish, eggs, or tofu. One ounce (oz) of lean protein is equal to: ? 1 oz (28 g) of meat, chicken, or fish. ? 1 egg. ?  cup (62 g) of tofu.  Eat some foods each day that contain healthy fats, such as avocado, nuts, seeds, and fish.   What foods should I eat? Fruits Berries. Apples. Oranges. Peaches. Apricots. Plums. Grapes. Mango. Papaya. Pomegranate. Kiwi. Cherries. Vegetables Lettuce. Spinach. Leafy greens, including kale, chard, collard greens, and mustard greens. Beets. Cauliflower. Cabbage. Broccoli. Carrots. Green beans. Tomatoes. Peppers. Onions. Cucumbers. Brussels sprouts. Grains Whole grains, such as whole-wheat or whole-grain bread, crackers, tortillas, cereal, and pasta. Unsweetened oatmeal. Quinoa. Brown or wild rice. Meats and other proteins Seafood. Poultry without skin. Lean cuts of poultry and beef. Tofu. Nuts. Seeds. Dairy Low-fat or fat-free dairy products such as milk, yogurt, and cheese. The items listed above may not be a complete list of foods and beverages you can eat. Contact a dietitian for more information. What foods should I avoid? Fruits Fruits canned with  syrup. Vegetables Canned vegetables. Frozen vegetables with butter or cream sauce. Grains Refined white flour and flour products such as bread, pasta, snack foods, and cereals. Avoid all processed foods. Meats and other proteins Fatty cuts of meat. Poultry with skin. Breaded or fried meats. Processed meat. Avoid saturated fats. Dairy Full-fat yogurt, cheese, or milk. Beverages Sweetened drinks, such as soda or iced tea. The items listed above may not be a complete list of foods and beverages you should avoid. Contact a dietitian for more information. Questions to ask a health care provider  Do I need to meet with a diabetes educator?  Do I need to meet with a dietitian?  What number can I call if I have questions?  When are the best times to check my blood glucose? Where to find more information:  American Diabetes Association: diabetes.org  Academy of Nutrition and Dietetics: www.eatright.org  National Institute of Diabetes and Digestive and Kidney Diseases: www.niddk.nih.gov  Association of Diabetes Care and Education Specialists: www.diabeteseducator.org Summary  It is important to have healthy eating   habits because your blood sugar (glucose) levels are greatly affected by what you eat and drink.  A healthy meal plan will help you control your blood glucose and maintain a healthy lifestyle.  Your health care provider may recommend that you work with a dietitian to make a meal plan that is best for you.  Keep in mind that carbohydrates (carbs) and alcohol have immediate effects on your blood glucose levels. It is important to count carbs and to use alcohol carefully. This information is not intended to replace advice given to you by your health care provider. Make sure you discuss any questions you have with your health care provider. Document Revised: 07/20/2019 Document Reviewed: 07/20/2019 Elsevier Patient Education  2021 [employer]  

## 2021-01-18 NOTE — Progress Notes (Addendum)
Endocrinology Consult Note       01/18/2021, 11:02 AM   Subjective:    Patient ID: Abigail Jackson, female    DOB: 1966/08/06.  Abigail Jackson is being seen in consultation for management of currently uncontrolled symptomatic diabetes requested by  Ludwig Clarks, FNP.   Past Medical History:  Diagnosis Date  . Arthritis   . Diabetes mellitus without complication ([place])   . Heart murmur   . Hyperlipidemia   . Hypertension   . Irregular bowel habits   . Renal neoplasm    LEFT    Past Surgical History:  Procedure Laterality Date  . ROBOTIC ASSITED PARTIAL NEPHRECTOMY Left 08/03/2015   Procedure: ROBOTIC ASSISTED PARTIAL LEFT NEPHRECTOMY;  Surgeon: Raynelle Bring, MD;  Location: [facility];  Service: Urology;  Laterality: Left;  . SHOULDER SURGERY  2006   RIGHT    Social History   Socioeconomic History  . Marital status: Married    Spouse name: Not on file  . Number of children: Not on file  . Years of education: Not on file  . Highest education level: Not on file  Occupational History  . Not on file  Tobacco Use  . Smoking status: Never Smoker  . Smokeless tobacco: Never Used  Vaping Use  . Vaping Use: Never used  Substance and Sexual Activity  . Alcohol use: No  . Drug use: No  . Sexual activity: Not on file  Other Topics Concern  . Not on file  Social History Narrative  . Not on file   Social Determinants of Health   Financial Resource Strain: Not on file  Food Insecurity: Not on file  Transportation Needs: Not on file  Physical Activity: Not on file  Stress: Not on file  Social Connections: Not on file    Family History  Problem Relation Age of Onset  . Diabetes Mother   . Hypertension Mother   . Diabetes Father   . Hypertension Father   . Thyroid disease Father   . Hyperlipidemia Father   . Kidney disease Father   . Heart failure Father     Outpatient Encounter  Medications as of 01/18/2021  Medication Sig  . Continuous Blood Gluc Sensor (DEXCOM G6 SENSOR) MISC Change sensor every 10 days as directed  . Continuous Blood Gluc Transmit (DEXCOM G6 TRANSMITTER) MISC Change transmitter every 90 days as directed  . dicyclomine (BENTYL) 10 MG capsule Take 1 capsule by mouth 2 (two) times daily.  Marland Kitchen ACCU-CHEK FASTCLIX LANCETS MISC 1 each by Other route 4 (four) times daily.  Marland Kitchen acetaminophen-codeine (TYLENOL #3) 300-30 MG tablet Take 1 tablet by mouth 3 (three) times daily as needed for pain.  Marland Kitchen amitriptyline (ELAVIL) 100 MG tablet Take 100 mg by mouth at bedtime.  (Patient not taking: Reported on 01/18/2021)  . aspirin EC 81 MG tablet Take 81 mg by mouth daily.  . blood glucose meter kit and supplies KIT Dispense based on patient and insurance preference. Use up to four times daily as directed. (FOR ICD-10 E11.65)  . cholecalciferol (VITAMIN D) 400 units TABS tablet Take 400 Units by mouth.  . etodolac (LODINE) 400 MG tablet Take 400  mg by mouth 2 (two) times daily.  . Ferrous Sulfate (IRON) 325 (65 Fe) MG TABS Take by mouth. (Patient not taking: Reported on 01/18/2021)  . Flaxseed, Linseed, (FLAXSEED OIL PO) Take 1,000 mg by mouth daily.  Marland Kitchen glipiZIDE (GLUCOTROL) 5 MG tablet Take 1 tablet (5 mg total) by mouth 2 (two) times daily with a meal. (Patient taking differently: Take 10 mg by mouth 2 (two) times daily with a meal.)  . glucose blood test strip 1 each by Other route 4 (four) times daily. Use as instructed 4 x daily. E11.65  . hydrochlorothiazide (HYDRODIURIL) 25 MG tablet Take 25 mg by mouth daily.  . insulin degludec (TRESIBA FLEXTOUCH) 100 UNIT/ML SOPN FlexTouch Pen Inject 0.2 mLs (20 Units total) into the skin daily. (Patient taking differently: Inject 35 Units into the skin daily.)  . Insulin Pen Needle (B-D ULTRAFINE III SHORT PEN) 31G X 8 MM MISC 1 each by Does not apply route as directed. Use with insulin pen qhs  . linagliptin (TRADJENTA) 5 MG TABS  tablet Take 5 mg by mouth daily.  Marland Kitchen lisinopril (PRINIVIL,ZESTRIL) 10 MG tablet Take 10 mg by mouth daily.  . metoprolol succinate (TOPROL-XL) 50 MG 24 hr tablet Take 1 tablet (50 mg total) by mouth daily. Take with or immediately following a meal. (Patient taking differently: Take 50 mg by mouth 2 (two) times daily. Take with or immediately following a meal.)  . omeprazole (PRILOSEC) 40 MG capsule Take 40 mg by mouth at bedtime. (Patient not taking: Reported on 01/18/2021)  . pravastatin (PRAVACHOL) 40 MG tablet Take 40 mg by mouth at bedtime. (Patient not taking: Reported on 01/18/2021)   No facility-administered encounter medications on file as of 01/18/2021.    ALLERGIES: Allergies  Allergen Reactions  . Tramadol Itching    VACCINATION STATUS:  There is no immunization history on file for this patient.  Diabetes She presents for her initial diabetic visit. She has type 2 diabetes mellitus. Onset time: Diagnosed at approx age of 46. Her disease course has been stable. Hypoglycemia symptoms include nervousness/anxiousness, sweats and tremors. There are no diabetic associated symptoms. There are no hypoglycemic complications. Symptoms are stable. Diabetic complications include nephropathy. Risk factors for coronary artery disease include diabetes mellitus, dyslipidemia, family history, obesity, hypertension and sedentary lifestyle. Current diabetic treatment includes oral agent (dual therapy) and insulin injections. She is compliant with treatment most of the time. Her weight is fluctuating minimally. She is following a generally unhealthy diet. When asked about meal planning, she reported none. She has not had a previous visit with a dietitian. She rarely participates in exercise. Her breakfast blood glucose range is generally 140-180 mg/dl. Her bedtime blood glucose range is generally 140-180 mg/dl. (She presents today for her consultation with her meter and logs showing slightly above target  fasting and at goal postprandial glycemic profile.  Her most recent A1c was 7.2% on 12/26/20.  She routinely monitors glucose twice daily.  She admits to drinking mostly flavored water and some juices and does not really eat a routine meal at this time due to stomach pain.  She tends to snack throughout the day (pineapple and cantaloupe).  She does not engage in routine physical activity outside of housework.  She does report some mild episodes of hypoglycemia, randomly.) An ACE inhibitor/angiotensin II receptor blocker is being taken. She does not see a podiatrist.Eye exam is current.  Hyperlipidemia This is a chronic problem. The current episode started more than 1 year ago. The  problem is uncontrolled. Recent lipid tests were reviewed and are high. Exacerbating diseases include chronic renal disease, diabetes and obesity. Factors aggravating her hyperlipidemia include beta blockers, thiazides and fatty foods. Current antihyperlipidemic treatment includes statins. The current treatment provides moderate improvement of lipids. Compliance problems include adherence to diet and adherence to exercise.  Risk factors for coronary artery disease include diabetes mellitus, dyslipidemia, family history, hypertension, obesity and a sedentary lifestyle.  Hypertension This is a chronic problem. The current episode started more than 1 year ago. The problem has been gradually improving since onset. The problem is controlled. Associated symptoms include sweats. There are no associated agents to hypertension. Risk factors for coronary artery disease include diabetes mellitus, dyslipidemia, family history, obesity and sedentary lifestyle. Past treatments include beta blockers, diuretics and ACE inhibitors. The current treatment provides moderate improvement. There are no compliance problems.  Hypertensive end-organ damage includes kidney disease. with hx of left nephrectomy. Identifiable causes of hypertension include chronic  renal disease and sleep apnea.     Review of systems  Constitutional: + Minimally fluctuating body weight, current Body mass index is 43.54 kg/m., no fatigue, no subjective hyperthermia, no subjective hypothermia Eyes: no blurry vision, no xerophthalmia ENT: no sore throat, no nodules palpated in throat, no dysphagia/odynophagia, no hoarseness Cardiovascular: no chest pain, no shortness of breath, no palpitations, mild right leg swelling Respiratory: no cough, no shortness of breath Gastrointestinal: no nausea/vomiting/diarrhea; complains of stomach pain associated with eating Musculoskeletal: c/o generalized arthralgias, walks with cane Skin: no rashes, no hyperemia Neurological: no tremors, no numbness, no tingling, no dizziness Psychiatric: no depression, no anxiety  Objective:     BP 121/74   Pulse 69   Ht _0  (1.6 m)   Wt 245 lb 12.8 oz (111.5 kg)   BMI 43.54 kg/m   Wt Readings from Last 3 Encounters:  01/18/21 245 lb 12.8 oz (111.5 kg)  06/08/18 257 lb (116.6 kg)  04/23/18 260 lb (117.9 kg)     BP Readings from Last 3 Encounters:  01/18/21 121/74  06/08/18 134/77  04/23/18 (!) 150/72     Physical Exam- Limited  Constitutional:  Body mass index is 43.54 kg/m., not in acute distress, normal state of mind Eyes:  EOMI, no exophthalmos Neck: Supple Cardiovascular: RRR, no murmurs, rubs, or gallops, no edema Respiratory: Adequate breathing efforts, no crackles, rales, rhonchi, or wheezing Musculoskeletal: no gross deformities, strength intact in all four extremities, no gross restriction of joint movements, walks with cane Skin:  no rashes, no hyperemia Neurological: no tremor with outstretched hands    CMP ( most recent) CMP     Component Value Date/Time   NA 145 04/13/2018 1113   K 4.5 04/13/2018 1113   CL 104 04/13/2018 1113   CO2 30 04/13/2018 1113   GLUCOSE 132 (H) 04/13/2018 1113   BUN 15 12/26/2020 0000   CREATININE 1.6 (A) 12/26/2020 0000    CREATININE 1.46 (H) 04/13/2018 1113   CALCIUM 10.4 04/13/2018 1113   PROT 7.7 04/13/2018 1113   AST 25 04/13/2018 1113   ALT 21 04/13/2018 1113   BILITOT 0.4 04/13/2018 1113   GFRNONAA 41 (L) 04/13/2018 1113   GFRAA 39 12/26/2020 0000   GFRAA 47 (L) 04/13/2018 1113     Diabetic Labs (most recent): Lab Results  Component Value Date   HGBA1C 7.2 12/26/2020   HGBA1C 11.1 (H) 04/13/2018   HGBA1C 10.2 (H) 12/10/2017     Lipid Panel ( most recent) Lipid Panel  Component Value Date/Time   CHOL 194 12/10/2017 0844   TRIG 165 (H) 12/10/2017 0844   HDL 42 (L) 12/10/2017 0844   CHOLHDL 4.6 12/10/2017 0844   LDLCALC 124 (H) 12/10/2017 0844      Lab Results  Component Value Date   TSH 1.96 08/30/2020   TSH 2.547 08/06/2015           Assessment & Plan:   1) 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  Her most recent A1c was 7.2% on 12/26/20.  She routinely monitors glucose twice daily.  She admits to drinking mostly flavored water and some juices and does not really eat a routine meal at this time due to stomach pain.  She tends to snack throughout the day (pineapple and cantaloupe).  She does not engage in routine physical activity outside of housework.  She does report some mild episodes of hypoglycemia, randomly.  - Abigail Jackson has currently uncontrolled symptomatic type 2 DM since 55 years of age, with most recent A1c of 7.2 %.   -Recent labs reviewed.  - I had a long discussion with her about the progressive nature of diabetes and the pathology behind its complications. -her diabetes is complicated by CKD 3 and she remains at a high risk for more acute and chronic complications which include CAD, CVA, CKD, retinopathy, and neuropathy. These are all discussed in detail with her.  - I have counseled her on diet  and weight management by adopting a carbohydrate restricted/protein rich diet. Patient is encouraged to switch to unprocessed or minimally processed complex starch and increased protein intake (animal or plant source), fruits, and vegetables. -  she is advised to stick to a routine mealtimes to eat 3 meals a day and avoid unnecessary snacks (to snack only to correct hypoglycemia).   - she acknowledges that there is a room for improvement in her food and drink choices. - Suggestion is made for her to avoid simple carbohydrates from her diet including Cakes, Sweet Desserts, Ice Cream, Soda (diet and regular), Sweet Tea, Candies, Chips, Cookies, Store Bought Juices, Alcohol in Excess of 1-2 drinks a day, Artificial Sweeteners, Coffee Creamer, and "Sugar-free" Products. This will help patient to have more stable blood glucose profile and potentially avoid unintended weight gain.  - she will be scheduled with Jearld Fenton, RDN, CDE for diabetes education.  - I have approached her with the following individualized plan to manage her diabetes and patient agrees:   -She is advised to continue Tresiba 35 units SQ nightly.    -she is encouraged to start monitoring glucose 4 times daily, before meals and before bed, to log their readings on the clinic sheets provided, and bring them to review at follow up appointment in 2 weeks.  She inquired about getting a CGM.  I will submit request for Dexcom through [facility] to see if she meets requirements for one, as she will benefit from CGM device.  - she is warned not to take insulin without proper monitoring per orders. - Adjustment parameters are given to her for hypo and hyperglycemia in writing. - she is encouraged to call clinic for blood glucose levels less than 70 or above 300 mg /dl. - she is advised to continue Tradjenta 5 mg po daily, therapeutically suitable for patient. - her Glipizide will be reduced to 5 mg po  twice daily with meals to avoid  hypoglycemia. - she is not a candidate for Metformin due to concurrent renal insufficiency.  - Specific targets for  A1c; LDL, HDL, and Triglycerides were discussed with the patient.  2) Blood Pressure /Hypertension:  her blood pressure is controlled to target.   she is advised to continue her current medications including HCTZ 25 mg po daily, Lisinopril 10 mg p.o. daily, and Metoprolol 50 mg po twice daily with breakfast.  3) Lipids/Hyperlipidemia:    Review of her recent lipid panel from 12/26/20 showed uncontrolled LDL at 132 .  she is advised to continue Pravastatin 40 mg daily at bedtime.  Side effects and precautions discussed with her.  4)  Weight/Diet:  her Body mass index is 43.54 kg/m.  -  clearly complicating her diabetes care.   she is a candidate for weight loss. I discussed with her the fact that loss of 5 - 10% of her  current body weight will have the most impact on her diabetes management.  Exercise, and detailed carbohydrates information provided  -  detailed on discharge instructions.  5) Chronic Care/Health Maintenance: -she is on ACEI/ARB and Statin medications and is encouraged to initiate and continue to follow up with Ophthalmology, Dentist, Podiatrist at least yearly or according to recommendations, and advised to stay away from smoking. I have recommended yearly flu vaccine and pneumonia vaccine at least every 5 years; moderate intensity exercise for up to 150 minutes weekly; and sleep for at least 7 hours a day.  - she is advised to maintain close follow up with Ludwig Clarks, FNP for primary care needs, as well as her other providers for optimal and coordinated care.   - Time spent in this patient care: 60 min, of which > 50% was spent in counseling her about her diabetes and the rest reviewing her blood glucose logs, discussing her hypoglycemia and hyperglycemia episodes, reviewing her current and previous labs/studies (including abstraction from other facilities) and  medications doses and developing a long term treatment plan based on the latest standards of care/guidelines; and documenting her care.    Please refer to Patient Instructions for Blood Glucose Monitoring and Insulin/Medications Dosing Guide" in media tab for additional information. Please also refer to "Patient Self Inventory" in the Media tab for reviewed elements of pertinent patient history.  Abigail Jackson participated in the discussions, expressed understanding, and voiced agreement with the above plans.  All questions were answered to her satisfaction. she is encouraged to contact clinic should she have any questions or concerns prior to her return visit.     Follow up plan: - Return in about 2 weeks (around 02/01/2021) for Diabetes F/U, Bring meter and logs, ABI next visit.    Rayetta Pigg, [facility] [address] [place], [place] [address] Phone: [phone] Fax: [phone]  01/18/2021, 11:02 AM

## 2021-01-24 ENCOUNTER — Other Ambulatory Visit: Payer: Self-pay

## 2021-01-24 ENCOUNTER — Emergency Department (HOSPITAL_COMMUNITY): Payer: Medicare (Managed Care)

## 2021-01-24 ENCOUNTER — Emergency Department (HOSPITAL_COMMUNITY)
Admission: EM | Admit: 2021-01-24 | Discharge: 2021-01-24 | Disposition: A | Discharge status: Home / Self Care | Payer: Medicare (Managed Care) | Attending: Emergency Medicine | Admitting: Emergency Medicine

## 2021-01-24 ENCOUNTER — Encounter (HOSPITAL_COMMUNITY): Payer: Self-pay

## 2021-01-24 DIAGNOSIS — E1122 Type 2 diabetes mellitus with diabetic chronic kidney disease: Secondary | ICD-10-CM | POA: Insufficient documentation

## 2021-01-24 DIAGNOSIS — I129 Hypertensive chronic kidney disease with stage 1 through stage 4 chronic kidney disease, or unspecified chronic kidney disease: Secondary | ICD-10-CM | POA: Insufficient documentation

## 2021-01-24 DIAGNOSIS — M79604 Pain in right leg: Secondary | ICD-10-CM | POA: Insufficient documentation

## 2021-01-24 DIAGNOSIS — Z85528 Personal history of other malignant neoplasm of kidney: Secondary | ICD-10-CM | POA: Diagnosis not present

## 2021-01-24 DIAGNOSIS — Z794 Long term (current) use of insulin: Secondary | ICD-10-CM | POA: Insufficient documentation

## 2021-01-24 DIAGNOSIS — Z7984 Long term (current) use of oral hypoglycemic drugs: Secondary | ICD-10-CM | POA: Insufficient documentation

## 2021-01-24 DIAGNOSIS — Z7982 Long term (current) use of aspirin: Secondary | ICD-10-CM | POA: Diagnosis not present

## 2021-01-24 DIAGNOSIS — M545 Low back pain, unspecified: Secondary | ICD-10-CM | POA: Diagnosis present

## 2021-01-24 DIAGNOSIS — N183 Chronic kidney disease, stage 3 unspecified: Secondary | ICD-10-CM | POA: Insufficient documentation

## 2021-01-24 DIAGNOSIS — Z79899 Other long term (current) drug therapy: Secondary | ICD-10-CM | POA: Insufficient documentation

## 2021-01-24 DIAGNOSIS — M5441 Lumbago with sciatica, right side: Secondary | ICD-10-CM | POA: Diagnosis not present

## 2021-01-24 LAB — I-STAT BETA HCG BLOOD, ED (MC, WL, AP ONLY): I-stat hCG, quantitative: <5 m[IU]/mL (ref <5)

## 2021-01-24 MED ORDER — METHOCARBAMOL 500 MG PO TABS: 500.0000 mg | ORAL_TABLET | Freq: Two times a day (BID) | ORAL | 0 refills | Status: DC

## 2021-01-24 MED ORDER — LIDOCAINE 5 % EX PTCH
1.0000 | MEDICATED_PATCH | CUTANEOUS | Status: DC
  Administered 2021-01-24: 1 via TRANSDERMAL
  Filled 2021-01-24: qty 1

## 2021-01-24 MED ORDER — KETOROLAC TROMETHAMINE 30 MG/ML IJ SOLN
30.0000 mg | Freq: Once | INTRAMUSCULAR | Status: AC
  Administered 2021-01-24: 30 mg via INTRAMUSCULAR
  Filled 2021-01-24: qty 1

## 2021-01-24 MED ORDER — ACETAMINOPHEN 500 MG PO TABS
1000.0000 mg | ORAL_TABLET | Freq: Once | ORAL | Status: AC
  Administered 2021-01-24: 1000 mg via ORAL
  Filled 2021-01-24: qty 2

## 2021-01-24 MED ORDER — HYDROCODONE-ACETAMINOPHEN 5-325 MG PO TABS: 1.0000 | ORAL_TABLET | Freq: Four times a day (QID) | ORAL | 0 refills | Status: DC | PRN

## 2021-01-24 MED ORDER — METHOCARBAMOL 500 MG PO TABS
500.0000 mg | ORAL_TABLET | Freq: Once | ORAL | Status: AC
  Administered 2021-01-24: 500 mg via ORAL
  Filled 2021-01-24: qty 1

## 2021-01-24 NOTE — ED Triage Notes (Signed)
Pt to er via ems, per ems pt was seen at the hospital in [place] this am and is was given pain medication and treated for sciatica, states that she is here for the same.  Pt states that she is here for some R leg pain, states that she went to [facility] and they said that she didin't have a blood clot a couple of months ago, states that she continued to have the pain, states that today she went back to [facility] because she couldn't walk and it hurt more, states that they gave her a shot and was told to go home and rest, states that she wasn't feeling any better so she called 911.  Pt c/o R leg pain.

## 2021-01-24 NOTE — Discharge Instructions (Addendum)
It was a pleasure taking care of you today! As discussed, your CT scan showed a bulging disc. I am sending you home with pain medication and a muscle relaxer.  Muscle relaxer and pain medication can cause drowsiness so do not drive or operate machinery while on the medication.  I have included the number of the neurosurgeon.  Please call tomorrow to schedule an appointment for further evaluation.  Please follow-up with PCP if symptoms do not improve over the next few days.  Return to the ER for any worsening symptoms.

## 2021-01-24 NOTE — ED Provider Notes (Signed)
Emergency Medicine Provider Triage Evaluation Note  Abigail Jackson , a 55 y.o. female  was evaluated in triage.  Pt complains of right-sided low back pain that radiates down right lower extremity x3 to 4 days. She notes she has been diagnosed with a herniated disc previously. Patient was seen at [facility] ED earlier today and diagnosed with sciatica and given a Toradol shot per patient.  Patient returns to the ED due to continued pain.  Patient denies saddle paresthesias, bowel/bladder incontinence, lower extremity numbness/tingling, lower extremity weakness, fever/chills, and IV drug use.  No history of cancer.  No history of back trauma.  Review of Systems  Positive: Back pain Negative: fever  Physical Exam  BP 124/67 (BP Location: Right Arm)   Pulse 85   Temp 98.9 F (37.2 C) (Oral)   Resp 20   Ht 5\' 3"  (1.6 m)   Wt 110.7 kg   SpO2 99%   BMI 43.22 kg/m  Gen:   Awake, no distress   Resp:  Normal effort  MSK:   Moves extremities without difficulty  Other:  Reproducible right-sided lumbar paraspinal tenderness.  Medical Decision Making  Medically screening exam initiated at 4:31 PM.  Appropriate orders placed.  TANAIA HAWKEY was informed that the remainder of the evaluation will be completed by another provider, this initial triage assessment does not replace that evaluation, and the importance of remaining in the ED until their evaluation is complete.     Suzy Bouchard, PA-C 01/24/21 1634    Fredia Sorrow, MD 01/25/21 [phone]

## 2021-01-24 NOTE — ED Provider Notes (Signed)
[facility] EMERGENCY DEPARTMENT Provider Note   CSN: [id number] Arrival date & time: 01/24/21  1508     History Chief Complaint  Patient presents with  . Back Pain    Abigail Jackson is a 55 y.o. female with a past medical history significant for diabetes, hyperlipidemia, hypertension, and renal neoplasm who presents to the ED due to right-sided low back pain x3-4 days. Patient notes she has previously been diagnosed with a herniated disc. She states pain has progressively gotten worse over the past few days. Pain is worse with palpation and movement. She was seen in [place] ED where she was diagnosed with sciatica and given a Toradol shot per patient. Unable to obtain records for ED visit.  Denies saddle paresthesias, bowel/bladder incontinence, lower extremity numbness/tingling, lower extremity weakness, fever/chills, IV drug use.  Denies recent back trauma. Denies urinary symptoms. No overlying rash. No treatment prior to arrival.   History obtained from patient and past medical records. No interpreter used during encounter.      Past Medical History:  Diagnosis Date  . Arthritis   . Diabetes mellitus without complication ([place])   . Heart murmur   . Hyperlipidemia   . Hypertension   . Irregular bowel habits   . Renal neoplasm    LEFT    Patient Active Problem List   Diagnosis Date Noted  . Type 2 diabetes mellitus with stage 3 chronic kidney disease, without long-term current use of insulin ([place]) 10/07/2017  . Essential hypertension, benign 10/07/2017  . Mixed hyperlipidemia 10/07/2017  . Morbid obesity ([place]) 10/07/2017  . Sinus tachycardia 08/06/2015  . Left pulmonary infiltrate on CXR 08/06/2015  . Leucocytosis 08/06/2015  . Left flank pain 08/06/2015  . OSA (obstructive sleep apnea) 08/06/2015  . Renal neoplasm 08/03/2015    Past Surgical History:  Procedure Laterality Date  . ROBOTIC ASSITED PARTIAL NEPHRECTOMY Left 08/03/2015   Procedure: ROBOTIC ASSISTED  PARTIAL LEFT NEPHRECTOMY;  Surgeon: Raynelle Bring, MD;  Location: [facility];  Service: Urology;  Laterality: Left;  . SHOULDER SURGERY  2006   RIGHT     OB History   No obstetric history on file.     Family History  Problem Relation Age of Onset  . Diabetes Mother   . Hypertension Mother   . Diabetes Father   . Hypertension Father   . Thyroid disease Father   . Hyperlipidemia Father   . Kidney disease Father   . Heart failure Father     Social History   Tobacco Use  . Smoking status: Never Smoker  . Smokeless tobacco: Never Used  Vaping Use  . Vaping Use: Never used  Substance Use Topics  . Alcohol use: No  . Drug use: No    Home Medications Prior to Admission medications   Medication Sig Start Date End Date Taking? Authorizing Provider  ACCU-CHEK FASTCLIX LANCETS MISC 1 each by Other route 4 (four) times daily. 08/20/18   Cassandria Anger, MD  acetaminophen-codeine (TYLENOL #3) 300-30 MG tablet Take 1 tablet by mouth 3 (three) times daily as needed for pain.    [provider]  amitriptyline (ELAVIL) 100 MG tablet Take 100 mg by mouth at bedtime.  Patient not taking: Reported on 01/18/2021    [provider]  aspirin EC 81 MG tablet Take 81 mg by mouth daily.    [provider]  blood glucose meter kit and supplies KIT Dispense based on patient and insurance preference. Use up to four times  daily as directed. (FOR ICD-10 E11.65) 08/31/18   Cassandria Anger, MD  cholecalciferol (VITAMIN D) 400 units TABS tablet Take 400 Units by mouth.    [provider]  Continuous Blood Gluc Sensor (DEXCOM G6 SENSOR) MISC Change sensor every 10 days as directed 01/18/21   Brita Romp, NP  Continuous Blood Gluc Transmit (DEXCOM G6 TRANSMITTER) MISC Change transmitter every 90 days as directed 01/18/21   Brita Romp, NP  dicyclomine (BENTYL) 10 MG capsule Take 1 capsule by mouth 2 (two) times daily. 02/07/20   [provider]   etodolac (LODINE) 400 MG tablet Take 400 mg by mouth 2 (two) times daily. 01/02/21   [provider]  Ferrous Sulfate (IRON) 325 (65 Fe) MG TABS Take by mouth. Patient not taking: Reported on 01/18/2021    [provider]  Flaxseed, Linseed, (FLAXSEED OIL PO) Take 1,000 mg by mouth daily.    [provider]  glipiZIDE (GLUCOTROL) 5 MG tablet Take 1 tablet (5 mg total) by mouth 2 (two) times daily with a meal. Patient taking differently: Take 10 mg by mouth 2 (two) times daily with a meal. 03/20/18   Nida, Marella Chimes, MD  glucose blood test strip 1 each by Other route 4 (four) times daily. Use as instructed 4 x daily. E11.65 08/20/18   Cassandria Anger, MD  hydrochlorothiazide (HYDRODIURIL) 25 MG tablet Take 25 mg by mouth daily.    [provider]  insulin degludec (TRESIBA FLEXTOUCH) 100 UNIT/ML SOPN FlexTouch Pen Inject 0.2 mLs (20 Units total) into the skin daily. Patient taking differently: Inject 35 Units into the skin daily. 08/20/18   Cassandria Anger, MD  Insulin Pen Needle (B-D ULTRAFINE III SHORT PEN) 31G X 8 MM MISC 1 each by Does not apply route as directed. Use with insulin pen qhs 06/24/18   Cassandria Anger, MD  linagliptin (TRADJENTA) 5 MG TABS tablet Take 5 mg by mouth daily.    [provider]  lisinopril (PRINIVIL,ZESTRIL) 10 MG tablet Take 10 mg by mouth daily.    [provider]  metoprolol succinate (TOPROL-XL) 50 MG 24 hr tablet Take 1 tablet (50 mg total) by mouth daily. Take with or immediately following a meal. Patient taking differently: Take 50 mg by mouth 2 (two) times daily. Take with or immediately following a meal. 08/08/15   Acie Fredrickson, MD  omeprazole (PRILOSEC) 40 MG capsule Take 40 mg by mouth at bedtime. Patient not taking: Reported on 01/18/2021    [provider]  pravastatin (PRAVACHOL) 40 MG tablet Take 40 mg by mouth at bedtime. Patient not taking: Reported on 01/18/2021     [provider]    Allergies    Tramadol  Review of Systems   Review of Systems  Constitutional: Negative for chills and fever.  Gastrointestinal: Negative for abdominal pain.  Genitourinary: Negative for dysuria.  Musculoskeletal: Positive for back pain and gait problem.  All other systems reviewed and are negative.   Physical Exam Updated Vital Signs BP (!) 109/96 (BP Location: Right Arm)   Pulse 83   Temp 99.4 F (37.4 C) (Oral)   Resp 18   Ht '5\' 3"'  (1.6 m)   Wt 110.7 kg   SpO2 100%   BMI 43.22 kg/m   Physical Exam Vitals and nursing note reviewed.  Constitutional:      General: She is not in acute distress.    Appearance: She is not ill-appearing.  HENT:  Head: Normocephalic.  Eyes:     Pupils: Pupils are equal, round, and reactive to light.  Cardiovascular:     Rate and Rhythm: Normal rate and regular rhythm.     Pulses: Normal pulses.     Heart sounds: Normal heart sounds. No murmur heard. No friction rub. No gallop.   Pulmonary:     Effort: Pulmonary effort is normal.     Breath sounds: Normal breath sounds.  Abdominal:     General: Abdomen is flat. There is no distension.     Palpations: Abdomen is soft.     Tenderness: There is no abdominal tenderness. There is no guarding or rebound.  Musculoskeletal:        General: Normal range of motion.     Cervical back: Neck supple.     Comments: No thoracic midline tenderness. Lumbar midline tenderness. Reproducible right-sided lumbar paraspinal tenderness  Skin:    General: Skin is warm and dry.  Neurological:     General: No focal deficit present.     Mental Status: She is alert.  Psychiatric:        Mood and Affect: Mood normal.        Behavior: Behavior normal.     ED Results / Procedures / Treatments   Labs (all labs ordered are listed, but only abnormal results are displayed) Labs Reviewed  I-STAT BETA HCG BLOOD, ED ([facility], [facility], [facility] ONLY)    EKG None  Radiology CT Lumbar Spine  Wo Contrast  Result Date: 01/24/2021 CLINICAL DATA:  Low back pain right leg pain. History of renal neoplasm and partial left nephrectomy. EXAM: CT LUMBAR SPINE WITHOUT CONTRAST TECHNIQUE: Multidetector CT imaging of the lumbar spine was performed without intravenous contrast administration. Multiplanar CT image reconstructions were also generated. COMPARISON:  CT abdomen and pelvis 08/06/2015 FINDINGS: Segmentation: Normal Alignment: Normal Vertebrae: Negative for fracture or mass. Negative for metastatic disease. Paraspinal and other soft tissues: Mild atherosclerotic disease. Negative for aortic aneurysm. No paraspinous mass or adenopathy. Limited imaging of the kidneys reveals both kidneys are present without mass. Prior partial nephrectomy on the left. Disc levels: L1-2: Negative L2-3: Mild disc bulging and mild facet degeneration. Negative for stenosis L3-4: Moderate disc bulging with left foraminal disc protrusion. Moderate facet degeneration. Mild spinal stenosis. Moderate subarticular and foraminal stenosis on the left. L4-5: Diffuse disc bulging and central disc protrusion. Moderate to severe spinal stenosis. Moderate subarticular stenosis bilaterally. L5-S1: Mild disc degeneration and mild facet degeneration. Mild left subarticular stenosis. IMPRESSION: 1. Negative for fracture or metastatic disease lumbar spine 2. Mild spinal stenosis L3-4 with moderate subarticular and foraminal stenosis on the left 3. Disc bulging and central disc protrusion L4-5 causing moderate to severe spinal stenosis and moderate subarticular stenosis bilaterally. Electronically Signed   By: Franchot Gallo M.D.   On: 01/24/2021 18:33    Procedures Procedures   Medications Ordered in ED Medications  lidocaine (LIDODERM) 5 % 1 patch (1 patch Transdermal Patch Applied 01/24/21 1806)  acetaminophen (TYLENOL) tablet 1,000 mg (1,000 mg Oral Given 01/24/21 1721)  methocarbamol (ROBAXIN) tablet 500 mg (500 mg Oral Given 01/24/21  1805)  ketorolac (TORADOL) 30 MG/ML injection 30 mg (30 mg Intramuscular Given 01/24/21 1805)    ED Course  I have reviewed the triage vital signs and the nursing notes.  Pertinent labs & imaging results that were available during my care of the patient were reviewed by me and considered in my medical decision making (see chart for details).  MDM Rules/Calculators/A&P                         55 year old female presents to the ED due to right-sided low back pain that radiates down right lower extremity.  Patient has a history of renal neoplasm.  Patient denies saddle paresthesias, bowel/bladder incontinence, lower extremity numbness/tingling, lower extremity weakness, fever/chills, and IV drug use.  Upon arrival, stable vitals.  Patient in no acute distress and nontoxic-appearing.  Physical exam significant for mild midline lumbar tenderness most significant in right paraspinal region.  Bilateral lower extremities neurovascularly intact.  Low suspicion for cauda equina or central cord compression.  Given patient has a history of renal neoplasm, will obtain CT lumbar spine to rule out static disease.  Robaxin, Toradol, and Lidoderm patches given for symptomatic relief.  CT lumbar spine personally reviewed which demonstrates: IMPRESSION:  1. Negative for fracture or metastatic disease lumbar spine  2. Mild spinal stenosis L3-4 with moderate subarticular and  foraminal stenosis on the left  3. Disc bulging and central disc protrusion L4-5 causing moderate to  severe spinal stenosis and moderate subarticular stenosis  bilaterally.   Patient able to ambulate here in the ED without difficulty. Patient discharged with symptomatic treatment. Neurosurgery number given at discharge. Strict ED precautions discussed with patient. Patient states understanding and agrees to plan. Patient discharged home in no acute distress and stable vitals  Final Clinical Impression(s) / ED Diagnoses Final diagnoses:   Acute right-sided low back pain with right-sided sciatica    Rx / DC Orders ED Discharge Orders    None       Karie Kirks 01/24/21 1906    Fredia Sorrow, MD 01/25/21 [phone]

## 2021-01-29 ENCOUNTER — Other Ambulatory Visit: Payer: Self-pay

## 2021-01-29 ENCOUNTER — Encounter (HOSPITAL_COMMUNITY): Payer: Self-pay | Admitting: Emergency Medicine

## 2021-01-29 ENCOUNTER — Emergency Department (HOSPITAL_COMMUNITY)
Admission: EM | Admit: 2021-01-29 | Discharge: 2021-01-30 | Disposition: A | Discharge status: Home / Self Care | Payer: Medicare (Managed Care) | Attending: Emergency Medicine | Admitting: Emergency Medicine

## 2021-01-29 DIAGNOSIS — Z7982 Long term (current) use of aspirin: Secondary | ICD-10-CM | POA: Diagnosis not present

## 2021-01-29 DIAGNOSIS — E1122 Type 2 diabetes mellitus with diabetic chronic kidney disease: Secondary | ICD-10-CM | POA: Diagnosis not present

## 2021-01-29 DIAGNOSIS — M79661 Pain in right lower leg: Secondary | ICD-10-CM | POA: Insufficient documentation

## 2021-01-29 DIAGNOSIS — Z79899 Other long term (current) drug therapy: Secondary | ICD-10-CM | POA: Diagnosis not present

## 2021-01-29 DIAGNOSIS — Z7984 Long term (current) use of oral hypoglycemic drugs: Secondary | ICD-10-CM | POA: Insufficient documentation

## 2021-01-29 DIAGNOSIS — Z794 Long term (current) use of insulin: Secondary | ICD-10-CM | POA: Insufficient documentation

## 2021-01-29 DIAGNOSIS — I129 Hypertensive chronic kidney disease with stage 1 through stage 4 chronic kidney disease, or unspecified chronic kidney disease: Secondary | ICD-10-CM | POA: Diagnosis not present

## 2021-01-29 DIAGNOSIS — N183 Chronic kidney disease, stage 3 unspecified: Secondary | ICD-10-CM | POA: Diagnosis not present

## 2021-01-29 DIAGNOSIS — M5417 Radiculopathy, lumbosacral region: Secondary | ICD-10-CM | POA: Diagnosis not present

## 2021-01-29 DIAGNOSIS — M545 Low back pain, unspecified: Secondary | ICD-10-CM | POA: Diagnosis present

## 2021-01-29 NOTE — ED Triage Notes (Signed)
Pt to the ED with c/o rt leg pain, seen here for the same on 6/1.

## 2021-01-30 ENCOUNTER — Emergency Department (HOSPITAL_COMMUNITY): Payer: Medicare (Managed Care)

## 2021-01-30 DIAGNOSIS — M5417 Radiculopathy, lumbosacral region: Secondary | ICD-10-CM | POA: Diagnosis not present

## 2021-01-30 LAB — CBG MONITORING, ED: Glucose-Capillary: 68 mg/dL — ABNORMAL LOW (ref 70–99)

## 2021-01-30 MED ORDER — HYDROCODONE-ACETAMINOPHEN 5-325 MG PO TABS
2.0000 | ORAL_TABLET | Freq: Once | ORAL | Status: AC
  Administered 2021-01-30: 2 via ORAL
  Filled 2021-01-30: qty 2

## 2021-01-30 MED ORDER — MORPHINE SULFATE (PF) 4 MG/ML IV SOLN
4.0000 mg | Freq: Once | INTRAVENOUS | Status: AC
  Administered 2021-01-30: 4 mg via INTRAMUSCULAR
  Filled 2021-01-30: qty 1

## 2021-01-30 MED ORDER — HYDROCODONE-ACETAMINOPHEN 5-325 MG PO TABS: 1.0000 | ORAL_TABLET | Freq: Four times a day (QID) | ORAL | 0 refills | Status: DC | PRN

## 2021-01-30 NOTE — ED Notes (Signed)
Pt complaining of feeling shaky and that she is a diabetic. CNA took BG and it was 68. Pt given a soda to drink. Will reassess pt.

## 2021-01-30 NOTE — ED Provider Notes (Signed)
[facility] EMERGENCY DEPARTMENT Provider Note   CSN: [id number] Arrival date & time: 01/29/21  1728     History Chief Complaint  Patient presents with  . Leg Pain    Abigail Jackson is a 55 y.o. female.  The history is provided by the patient.  Leg Pain Location:  Leg Leg location:  R lower leg Pain details:    Quality:  Aching and cramping   Radiates to:  Does not radiate   Severity:  Moderate   Onset quality:  Gradual   Timing:  Constant   Progression:  Worsening Chronicity:  New Relieved by: Rest. Worsened by:  Bearing weight Associated symptoms: no fever   Patient history of diabetes, hypertension presents with right leg pain.  Patient reports she was bathing herself earlier and she noted knots on her lower leg.  Denies any falls or trauma.  She reports it hurts so much now she cannot walk  Patient was recently seen for back pain, but that is improving.  This pain is different and is in the lower leg.     Past Medical History:  Diagnosis Date  . Arthritis   . Diabetes mellitus without complication ([place])   . Heart murmur   . Hyperlipidemia   . Hypertension   . Irregular bowel habits   . Renal neoplasm    LEFT    Patient Active Problem List   Diagnosis Date Noted  . Type 2 diabetes mellitus with stage 3 chronic kidney disease, without long-term current use of insulin ([place]) 10/07/2017  . Essential hypertension, benign 10/07/2017  . Mixed hyperlipidemia 10/07/2017  . Morbid obesity ([place]) 10/07/2017  . Sinus tachycardia 08/06/2015  . Left pulmonary infiltrate on CXR 08/06/2015  . Leucocytosis 08/06/2015  . Left flank pain 08/06/2015  . OSA (obstructive sleep apnea) 08/06/2015  . Renal neoplasm 08/03/2015    Past Surgical History:  Procedure Laterality Date  . ROBOTIC ASSITED PARTIAL NEPHRECTOMY Left 08/03/2015   Procedure: ROBOTIC ASSISTED PARTIAL LEFT NEPHRECTOMY;  Surgeon: Raynelle Bring, MD;  Location: [facility];  Service: Urology;  Laterality: Left;   . SHOULDER SURGERY  2006   RIGHT     OB History   No obstetric history on file.     Family History  Problem Relation Age of Onset  . Diabetes Mother   . Hypertension Mother   . Diabetes Father   . Hypertension Father   . Thyroid disease Father   . Hyperlipidemia Father   . Kidney disease Father   . Heart failure Father     Social History   Tobacco Use  . Smoking status: Never Smoker  . Smokeless tobacco: Never Used  Vaping Use  . Vaping Use: Never used  Substance Use Topics  . Alcohol use: No  . Drug use: No    Home Medications Prior to Admission medications   Medication Sig Start Date End Date Taking? Authorizing Provider  ACCU-CHEK FASTCLIX LANCETS MISC 1 each by Other route 4 (four) times daily. 08/20/18   Cassandria Anger, MD  acetaminophen-codeine (TYLENOL #3) 300-30 MG tablet Take 1 tablet by mouth 3 (three) times daily as needed for pain.    [provider]  amitriptyline (ELAVIL) 100 MG tablet Take 100 mg by mouth at bedtime.  Patient not taking: Reported on 01/18/2021    [provider]  aspirin EC 81 MG tablet Take 81 mg by mouth daily.    [provider]  blood glucose meter kit and supplies KIT  Dispense based on patient and insurance preference. Use up to four times daily as directed. (FOR ICD-10 E11.65) 08/31/18   Cassandria Anger, MD  cholecalciferol (VITAMIN D) 400 units TABS tablet Take 400 Units by mouth.    [provider]  Continuous Blood Gluc Sensor (DEXCOM G6 SENSOR) MISC Change sensor every 10 days as directed 01/18/21   Brita Romp, NP  Continuous Blood Gluc Transmit (DEXCOM G6 TRANSMITTER) MISC Change transmitter every 90 days as directed 01/18/21   Brita Romp, NP  dicyclomine (BENTYL) 10 MG capsule Take 1 capsule by mouth 2 (two) times daily. 02/07/20   [provider]  etodolac (LODINE) 400 MG tablet Take 400 mg by mouth 2 (two) times daily. 01/02/21   [provider]   Ferrous Sulfate (IRON) 325 (65 Fe) MG TABS Take by mouth. Patient not taking: Reported on 01/18/2021    [provider]  Flaxseed, Linseed, (FLAXSEED OIL PO) Take 1,000 mg by mouth daily.    [provider]  glipiZIDE (GLUCOTROL) 5 MG tablet Take 1 tablet (5 mg total) by mouth 2 (two) times daily with a meal. Patient taking differently: Take 10 mg by mouth 2 (two) times daily with a meal. 03/20/18   Nida, Marella Chimes, MD  glucose blood test strip 1 each by Other route 4 (four) times daily. Use as instructed 4 x daily. E11.65 08/20/18   Cassandria Anger, MD  hydrochlorothiazide (HYDRODIURIL) 25 MG tablet Take 25 mg by mouth daily.    [provider]  HYDROcodone-acetaminophen (NORCO/VICODIN) 5-325 MG tablet Take 1 tablet by mouth every 6 (six) hours as needed for severe pain. 01/24/21   Suzy Bouchard, PA-C  insulin degludec (TRESIBA FLEXTOUCH) 100 UNIT/ML SOPN FlexTouch Pen Inject 0.2 mLs (20 Units total) into the skin daily. Patient taking differently: Inject 35 Units into the skin daily. 08/20/18   Cassandria Anger, MD  Insulin Pen Needle (B-D ULTRAFINE III SHORT PEN) 31G X 8 MM MISC 1 each by Does not apply route as directed. Use with insulin pen qhs 06/24/18   Cassandria Anger, MD  linagliptin (TRADJENTA) 5 MG TABS tablet Take 5 mg by mouth daily.    [provider]  lisinopril (PRINIVIL,ZESTRIL) 10 MG tablet Take 10 mg by mouth daily.    [provider]  methocarbamol (ROBAXIN) 500 MG tablet Take 1 tablet (500 mg total) by mouth 2 (two) times daily. 01/24/21   Suzy Bouchard, PA-C  metoprolol succinate (TOPROL-XL) 50 MG 24 hr tablet Take 1 tablet (50 mg total) by mouth daily. Take with or immediately following a meal. Patient taking differently: Take 50 mg by mouth 2 (two) times daily. Take with or immediately following a meal. 08/08/15   Acie Fredrickson, MD  omeprazole (PRILOSEC) 40 MG capsule Take 40 mg by mouth at  bedtime. Patient not taking: Reported on 01/18/2021    [provider]  pravastatin (PRAVACHOL) 40 MG tablet Take 40 mg by mouth at bedtime. Patient not taking: Reported on 01/18/2021    [provider]    Allergies    Tramadol  Review of Systems   Review of Systems  Constitutional: Negative for fever.  Gastrointestinal: Negative for diarrhea and vomiting.  Musculoskeletal: Positive for arthralgias.  Neurological: Negative for weakness.  All other systems reviewed and are negative.   Physical Exam Updated Vital Signs BP 118/73   Pulse 78   Temp 98.7 F (37.1 C)   Resp 18  Ht 1.6 m (_0 )   Wt 110.7 kg   SpO2 99%   BMI 43.22 kg/m   Physical Exam CONSTITUTIONAL: Well developed/well nourished HEAD: Normocephalic/atraumatic EYES: EOMI/PERRL ENMT: Mucous membranes moist NECK: supple no meningeal signs CV: S1/S2 noted, no murmurs/rubs/gallops noted LUNGS: Lungs are clear to auscultation bilaterally, no apparent distress ABDOMEN: soft NEURO: Awake/alert,equal motor 5/5 strength noted with the following: hip flexion/knee flexion/extension, foot dorsi/plantar flexion, great toe extension intact bilaterally EXTREMITIES: pulses normal, full ROM See photo below.  There is right calf tenderness.  There is tenderness noted along the right tibial surface.  No crepitus, no abscess.  No fluctuance.  No deformities or bruising. Distal pulses are equal and intact in the lower extremities SKIN: warm, color normal, see photo PSYCH: no abnormalities of mood noted, alert and oriented to situation    Patient gave verbal permission to utilize photo for medical documentation only The image was not stored on any personal device ED Results / Procedures / Treatments   Labs (all labs ordered are listed, but only abnormal results are displayed) Labs Reviewed  CBG MONITORING, ED - Abnormal; Notable for the following components:      Result Value   Glucose-Capillary 68 (*)     All other components within normal limits    EKG None  Radiology DG Tibia/Fibula Right  Result Date: 01/30/2021 CLINICAL DATA:  Pain. o rt leg pain, seen here for the same on 6/1. Per family member pt has had lumps come up x 1 day on R proximal tibfib. EXAM: RIGHT TIBIA AND FIBULA - 2 VIEW COMPARISON:  None. FINDINGS: There is no evidence of fracture or other focal bone lesions. Visualized knee and ankle grossly unremarkable. Soft tissues are grossly unremarkable. IMPRESSION: Negative. Electronically Signed   By: Iven Finn M.D.   On: 01/30/2021 02:08    Procedures Procedures   Medications Ordered in ED Medications  morphine 4 MG/ML injection 4 mg (has no administration in time range)  HYDROcodone-acetaminophen (NORCO/VICODIN) 5-325 MG per tablet 2 tablet (2 tablets Oral Given 01/30/21 0110)    ED Course  I have reviewed the triage vital signs and the nursing notes.  Pertinent labs & imaging results that were available during my care of the patient were reviewed by me and considered in my medical decision making (see chart for details).    MDM Rules/Calculators/A&P                          2:17 AM Patient recently seen for low back pain and had CT imaging of lumbar spine that showed spinal stenosis and disc bulging but no other acute findings.  Patient reports today's issue was completely different than her previous ER visit Imaging is negative 3:01 AM X-ray negative.  Patient is in no distress. She now reports most of her pain is coming from her back into her right thigh.  She initially told me her only issue was pain in the right tibia.  No signs of DVT clinically She is a very poor historian.  She has no neuro deficits in her lower extremities.  She has equal strength in both lower extremities.  She can fully move the right leg without any difficulty while in the bed.  However when she stands up she has pain in the back of her leg which limits her ambulation. At this  point I feel she is appropriate for discharge home, but she is amenable to home health  and physical therapy. Daughter reports she has PCP follow-up tomorrow and then will be referred to neurosurgery. At this time no indication for MRI or emergency surgery as no incontinence is reported and she has no focal weakness Short course of pain medications provided.  Patient and daughter agreeable with plan Final Clinical Impression(s) / ED Diagnoses Final diagnoses:  Lumbosacral radiculopathy    Rx / DC Orders ED Discharge Orders         Ordered    HYDROcodone-acetaminophen (NORCO/VICODIN) 5-325 MG tablet  Every 6 hours PRN        01/30/21 0258           Ripley Fraise, MD 01/30/21 [phone]

## 2021-01-30 NOTE — Discharge Instructions (Addendum)

## 2021-02-01 ENCOUNTER — Ambulatory Visit: Payer: Medicare (Managed Care) | Admitting: Nurse Practitioner

## 2021-02-02 ENCOUNTER — Ambulatory Visit (INDEPENDENT_AMBULATORY_CARE_PROVIDER_SITE_OTHER): Payer: Medicare (Managed Care) | Admitting: Nurse Practitioner

## 2021-02-02 ENCOUNTER — Other Ambulatory Visit: Payer: Self-pay

## 2021-02-02 ENCOUNTER — Encounter: Payer: Self-pay | Admitting: Nurse Practitioner

## 2021-02-02 VITALS — BP 92/61 | HR 67 | Ht 63.0 in | Wt 235.8 lb

## 2021-02-02 DIAGNOSIS — Z794 Long term (current) use of insulin: Secondary | ICD-10-CM

## 2021-02-02 DIAGNOSIS — E1122 Type 2 diabetes mellitus with diabetic chronic kidney disease: Secondary | ICD-10-CM | POA: Diagnosis not present

## 2021-02-02 DIAGNOSIS — N1832 Chronic kidney disease, stage 3b: Secondary | ICD-10-CM | POA: Diagnosis not present

## 2021-02-02 DIAGNOSIS — I10 Essential (primary) hypertension: Secondary | ICD-10-CM

## 2021-02-02 DIAGNOSIS — E782 Mixed hyperlipidemia: Secondary | ICD-10-CM | POA: Diagnosis not present

## 2021-02-02 DIAGNOSIS — N183 Chronic kidney disease, stage 3 unspecified: Secondary | ICD-10-CM

## 2021-02-02 MED ORDER — GLIPIZIDE 5 MG PO TABS: 5.0000 mg | ORAL_TABLET | Freq: Two times a day (BID) | ORAL | 3 refills | Status: DC

## 2021-02-02 MED ORDER — TRESIBA FLEXTOUCH 100 UNIT/ML ~~LOC~~ SOPN: 25.0000 [IU] | PEN_INJECTOR | Freq: Every day | SUBCUTANEOUS | 2 refills | Status: DC

## 2021-02-02 NOTE — Progress Notes (Signed)
Endocrinology Follow Up Note       02/02/2021, 10:12 AM   Subjective:    Patient ID: Abigail Jackson, female    DOB: 1966-02-10.  Abigail Jackson is being seen in follow up after being seen in follow up after being seen in consultation for management of currently uncontrolled symptomatic diabetes requested by  Ludwig Clarks, FNP.   Past Medical History:  Diagnosis Date   Arthritis    Diabetes mellitus without complication ([place])    Heart murmur    Hyperlipidemia    Hypertension    Irregular bowel habits    Renal neoplasm    LEFT    Past Surgical History:  Procedure Laterality Date   ROBOTIC ASSITED PARTIAL NEPHRECTOMY Left 08/03/2015   Procedure: ROBOTIC ASSISTED PARTIAL LEFT NEPHRECTOMY;  Surgeon: Raynelle Bring, MD;  Location: [facility];  Service: Urology;  Laterality: Left;   SHOULDER SURGERY  2006   RIGHT    Social History   Socioeconomic History   Marital status: Married    Spouse name: Not on file   Number of children: Not on file   Years of education: Not on file   Highest education level: Not on file  Occupational History   Not on file  Tobacco Use   Smoking status: Never   Smokeless tobacco: Never  Vaping Use   Vaping Use: Never used  Substance and Sexual Activity   Alcohol use: No   Drug use: No   Sexual activity: Not on file  Other Topics Concern   Not on file  Social History Narrative   Not on file   Social Determinants of Health   Financial Resource Strain: Not on file  Food Insecurity: Not on file  Transportation Needs: Not on file  Physical Activity: Not on file  Stress: Not on file  Social Connections: Not on file    Family History  Problem Relation Age of Onset   Diabetes Mother    Hypertension Mother    Diabetes Father    Hypertension Father    Thyroid disease Father    Hyperlipidemia Father    Kidney disease Father    Heart failure Father     Outpatient  Encounter Medications as of 02/02/2021  Medication Sig   ACCU-CHEK FASTCLIX LANCETS MISC 1 each by Other route 4 (four) times daily.   acetaminophen-codeine (TYLENOL #3) 300-30 MG tablet Take 1 tablet by mouth 3 (three) times daily as needed for pain.   amitriptyline (ELAVIL) 100 MG tablet Take 100 mg by mouth at bedtime.  (Patient not taking: Reported on 01/18/2021)   aspirin EC 81 MG tablet Take 81 mg by mouth daily.   blood glucose meter kit and supplies KIT Dispense based on patient and insurance preference. Use up to four times daily as directed. (FOR ICD-10 E11.65)   cholecalciferol (VITAMIN D) 400 units TABS tablet Take 400 Units by mouth.   dicyclomine (BENTYL) 10 MG capsule Take 1 capsule by mouth 2 (two) times daily.   etodolac (LODINE) 400 MG tablet Take 400 mg by mouth 2 (two) times daily. (Patient not taking: Reported on 02/02/2021)   Ferrous Sulfate (IRON) 325 (65 Fe) MG TABS  Take by mouth. (Patient not taking: Reported on 01/18/2021)   Flaxseed, Linseed, (FLAXSEED OIL PO) Take 1,000 mg by mouth daily.   glipiZIDE (GLUCOTROL) 5 MG tablet Take 1 tablet (5 mg total) by mouth 2 (two) times daily with a meal.   glucose blood test strip 1 each by Other route 4 (four) times daily. Use as instructed 4 x daily. E11.65   hydrochlorothiazide (HYDRODIURIL) 25 MG tablet Take 25 mg by mouth daily.   HYDROcodone-acetaminophen (NORCO/VICODIN) 5-325 MG tablet Take 1 tablet by mouth every 6 (six) hours as needed for severe pain.   insulin degludec (TRESIBA FLEXTOUCH) 100 UNIT/ML FlexTouch Pen Inject 25 Units into the skin daily.   Insulin Pen Needle (B-D ULTRAFINE III SHORT PEN) 31G X 8 MM MISC 1 each by Does not apply route as directed. Use with insulin pen qhs   linagliptin (TRADJENTA) 5 MG TABS tablet Take 5 mg by mouth daily.   lisinopril (PRINIVIL,ZESTRIL) 10 MG tablet Take 10 mg by mouth daily.   methocarbamol (ROBAXIN) 500 MG tablet Take 1 tablet (500 mg total) by mouth 2 (two) times daily.    metoprolol succinate (TOPROL-XL) 50 MG 24 hr tablet Take 1 tablet (50 mg total) by mouth daily. Take with or immediately following a meal. (Patient taking differently: Take 50 mg by mouth 2 (two) times daily. Take with or immediately following a meal.)   omeprazole (PRILOSEC) 40 MG capsule Take 40 mg by mouth at bedtime. (Patient not taking: Reported on 01/18/2021)   pravastatin (PRAVACHOL) 40 MG tablet Take 40 mg by mouth at bedtime. (Patient not taking: Reported on 01/18/2021)   [DISCONTINUED] Continuous Blood Gluc Sensor (DEXCOM G6 SENSOR) MISC Change sensor every 10 days as directed (Patient not taking: Reported on 02/02/2021)   [DISCONTINUED] Continuous Blood Gluc Transmit (DEXCOM G6 TRANSMITTER) MISC Change transmitter every 90 days as directed (Patient not taking: Reported on 02/02/2021)   [DISCONTINUED] glipiZIDE (GLUCOTROL) 5 MG tablet Take 1 tablet (5 mg total) by mouth 2 (two) times daily with a meal.   [DISCONTINUED] insulin degludec (TRESIBA FLEXTOUCH) 100 UNIT/ML SOPN FlexTouch Pen Inject 0.2 mLs (20 Units total) into the skin daily. (Patient taking differently: Inject 35 Units into the skin daily.)   No facility-administered encounter medications on file as of 02/02/2021.    ALLERGIES: Allergies  Allergen Reactions   Tramadol Itching    VACCINATION STATUS:  There is no immunization history on file for this patient.  Diabetes She presents for her follow-up diabetic visit. She has type 2 diabetes mellitus. Onset time: Diagnosed at approx age of 63. Her disease course has been stable. Hypoglycemia symptoms include nervousness/anxiousness, sweats and tremors. There are no diabetic associated symptoms. There are no hypoglycemic complications. Symptoms are stable. Diabetic complications include nephropathy. Risk factors for coronary artery disease include diabetes mellitus, dyslipidemia, family history, obesity, hypertension and sedentary lifestyle. Current diabetic treatment includes oral  agent (dual therapy) and insulin injections. She is compliant with treatment most of the time. Her weight is decreasing steadily. She is following a generally healthy diet. When asked about meal planning, she reported none. She has not had a previous visit with a dietitian. She rarely participates in exercise. Her home blood glucose trend is fluctuating minimally. Her breakfast blood glucose range is generally 90-110 mg/dl. Her lunch blood glucose range is generally 90-110 mg/dl. Her dinner blood glucose range is generally 90-110 mg/dl. Her bedtime blood glucose range is generally 90-110 mg/dl. (She presents today with her logs, no meter, showing  stable glycemic profile overall with less episodes of hypoglycemia.  She still has some hypoglycemia, likely associated with her pancreatitis and inability to eat balanced meals.  She is also being referred by her PCP to neurosurgeon for a ruptured disc in her back causing her tremendous pain.) An ACE inhibitor/angiotensin II receptor blocker is being taken. She does not see a podiatrist.Eye exam is current.  Hyperlipidemia This is a chronic problem. The current episode started more than 1 year ago. The problem is uncontrolled. Recent lipid tests were reviewed and are high. Exacerbating diseases include chronic renal disease, diabetes and obesity. Factors aggravating her hyperlipidemia include beta blockers, thiazides and fatty foods. Current antihyperlipidemic treatment includes statins. The current treatment provides moderate improvement of lipids. Compliance problems include adherence to diet and adherence to exercise.  Risk factors for coronary artery disease include diabetes mellitus, dyslipidemia, family history, hypertension, obesity and a sedentary lifestyle.  Hypertension This is a chronic problem. The current episode started more than 1 year ago. The problem has been rapidly improving since onset. The problem is controlled. Associated symptoms include sweats.  There are no associated agents to hypertension. Risk factors for coronary artery disease include diabetes mellitus, dyslipidemia, family history, obesity and sedentary lifestyle. Past treatments include beta blockers, diuretics and ACE inhibitors. The current treatment provides moderate improvement. There are no compliance problems.  Hypertensive end-organ damage includes kidney disease. with hx of left nephrectomy. Identifiable causes of hypertension include chronic renal disease and sleep apnea.    Review of systems  Constitutional: + Minimally fluctuating body weight, current Body mass index is 41.77 kg/m., no fatigue, no subjective hyperthermia, no subjective hypothermia Eyes: no blurry vision, no xerophthalmia ENT: no sore throat, no nodules palpated in throat, no dysphagia/odynophagia, no hoarseness Cardiovascular: no chest pain, no shortness of breath, no palpitations, mild right leg swelling Respiratory: no cough, no shortness of breath Gastrointestinal: no nausea/vomiting/diarrhea; complains of stomach pain associated with eating- recently diagnosed with pancreatitis Musculoskeletal: c/o generalized arthralgias, walks with walker Skin: no rashes, no hyperemia Neurological: no tremors, no numbness, no tingling, no dizziness Psychiatric: no depression, no anxiety  Objective:     BP 92/61   Pulse 67   Ht _0  (1.6 m)   Wt 235 lb 12.8 oz (107 kg)   BMI 41.77 kg/m   Wt Readings from Last 3 Encounters:  02/02/21 235 lb 12.8 oz (107 kg)  01/29/21 244 lb (110.7 kg)  01/24/21 244 lb (110.7 kg)     BP Readings from Last 3 Encounters:  02/02/21 92/61  01/30/21 135/84  01/24/21 (!) 109/96     Physical Exam- Limited  Constitutional:  Body mass index is 41.77 kg/m., not in acute distress, normal state of mind Eyes:  EOMI, no exophthalmos Neck: Supple Cardiovascular: RRR, no murmurs, rubs, or gallops, no edema Respiratory: Adequate breathing efforts, no crackles, rales,  rhonchi, or wheezing Musculoskeletal: no gross deformities, strength intact in all four extremities, no gross restriction of joint movements, walks with walker Skin:  no rashes, no hyperemia Neurological: no tremor with outstretched hands    CMP ( most recent) CMP     Component Value Date/Time   NA 145 04/13/2018 1113   K 4.5 04/13/2018 1113   CL 104 04/13/2018 1113   CO2 30 04/13/2018 1113   GLUCOSE 132 (H) 04/13/2018 1113   BUN 15 12/26/2020 0000   CREATININE 1.6 (A) 12/26/2020 0000   CREATININE 1.46 (H) 04/13/2018 1113   CALCIUM 10.4 04/13/2018 1113  PROT 7.7 04/13/2018 1113   AST 25 04/13/2018 1113   ALT 21 04/13/2018 1113   BILITOT 0.4 04/13/2018 1113   GFRNONAA 41 (L) 04/13/2018 1113   GFRAA 39 12/26/2020 0000   GFRAA 47 (L) 04/13/2018 1113     Diabetic Labs (most recent): Lab Results  Component Value Date   HGBA1C 7.2 12/26/2020   HGBA1C 11.1 (H) 04/13/2018   HGBA1C 10.2 (H) 12/10/2017     Lipid Panel ( most recent) Lipid Panel     Component Value Date/Time   CHOL 194 12/10/2017 0844   TRIG 165 (H) 12/10/2017 0844   HDL 42 (L) 12/10/2017 0844   CHOLHDL 4.6 12/10/2017 0844   LDLCALC 124 (H) 12/10/2017 0844      Lab Results  Component Value Date   TSH 1.96 08/30/2020   TSH 2.547 08/06/2015           Assessment & Plan:   1) Uncontrolled Type 2 Diabetes with kidney complications:  She presents today with her logs, no meter, showing stable glycemic profile overall with less episodes of hypoglycemia.  She still has some hypoglycemia, likely associated with her pancreatitis and inability to eat balanced meals.  She is also being referred by her PCP to neurosurgeon for a ruptured disc in her back causing her tremendous pain.  - Abigail Jackson has currently uncontrolled symptomatic type 2 DM since 55 years of age, with most recent A1c of 7.2 %.   -Recent labs reviewed.  - I had a long discussion with her about the progressive nature of diabetes  and the pathology behind its complications. -her diabetes is complicated by CKD 3 and she remains at a high risk for more acute and chronic complications which include CAD, CVA, CKD, retinopathy, and neuropathy. These are all discussed in detail with her.  - Nutritional counseling repeated at each appointment due to patients tendency to fall back in to old habits.  - The patient admits there is a room for improvement in their diet and drink choices. -  Suggestion is made for the patient to avoid simple carbohydrates from their diet including Cakes, Sweet Desserts / Pastries, Ice Cream, Soda (diet and regular), Sweet Tea, Candies, Chips, Cookies, Sweet Pastries, Store Bought Juices, Alcohol in Excess of 1-2 drinks a day, Artificial Sweeteners, Coffee Creamer, and "Sugar-free" Products. This will help patient to have stable blood glucose profile and potentially avoid unintended weight gain.   - I encouraged the patient to switch to unprocessed or minimally processed complex starch and increased protein intake (animal or plant source), fruits, and vegetables.   - Patient is advised to stick to a routine mealtimes to eat 3 meals a day and avoid unnecessary snacks (to snack only to correct hypoglycemia).  - she will be scheduled with Jearld Fenton, RDN, CDE for diabetes education.  - I have approached her with the following individualized plan to manage her diabetes and patient agrees:   -Based on her tight fasting glycemic profile, she is advised to lower her dose of Tresiba to 25 units nightly.  She can continue Glipizide 5 mg po twice daily with meals and Tradjenta 5 mg po daily.  -She is encouraged to continue monitoring blood glucose twice daily, before breakfast and before bed, and to call the clinic if she has readings less than 70 or greater than 300 for 3 tests in a row.  I sent in Rx for Dexcom at last visit but unfortunately her insurance would not cover it.  -  she is warned not to take  insulin without proper monitoring per orders. - Adjustment parameters are given to her for hypo and hyperglycemia in writing.  - she is not a candidate for Metformin due to concurrent renal insufficiency.  - Specific targets for  A1c; LDL, HDL, and Triglycerides were discussed with the patient.  2) Blood Pressure /Hypertension:  her blood pressure is controlled to target, borderline low today.   she is advised to continue her current medications including HCTZ 25 mg po daily, Lisinopril 10 mg p.o. daily, and Metoprolol 50 mg po twice daily with breakfast.  She is encouraged to follow up with her PCP regarding her BP readings (although she reports the low readings "come and go").  3) Lipids/Hyperlipidemia:    Review of her recent lipid panel from 12/26/20 showed uncontrolled LDL at 132 .  she is advised to continue Pravastatin 40 mg daily at bedtime.  Side effects and precautions discussed with her.  4)  Weight/Diet:  her Body mass index is 41.77 kg/m.  -  clearly complicating her diabetes care.   she is a candidate for weight loss. I discussed with her the fact that loss of 5 - 10% of her  current body weight will have the most impact on her diabetes management.  Exercise, and detailed carbohydrates information provided  -  detailed on discharge instructions.  5) Chronic Care/Health Maintenance: -she is on ACEI/ARB and Statin medications and is encouraged to initiate and continue to follow up with Ophthalmology, Dentist, Podiatrist at least yearly or according to recommendations, and advised to stay away from smoking. I have recommended yearly flu vaccine and pneumonia vaccine at least every 5 years; moderate intensity exercise for up to 150 minutes weekly; and sleep for at least 7 hours a day.  - she is advised to maintain close follow up with Ludwig Clarks, FNP for primary care needs, as well as her other providers for optimal and coordinated care.     I spent 35 minutes in the care of the  patient today including review of labs from [facility], Lipids, Thyroid Function, Hematology (current and previous including abstractions from other facilities); face-to-face time discussing  her blood glucose readings/logs, discussing hypoglycemia and hyperglycemia episodes and symptoms, medications doses, her options of short and long term treatment based on the latest standards of care / guidelines;  discussion about incorporating lifestyle medicine;  and documenting the encounter.    Please refer to Patient Instructions for Blood Glucose Monitoring and Insulin/Medications Dosing Guide"  in media tab for additional information. Please  also refer to " Patient Self Inventory" in the Media  tab for reviewed elements of pertinent patient history.  Abigail Jackson participated in the discussions, expressed understanding, and voiced agreement with the above plans.  All questions were answered to her satisfaction. she is encouraged to contact clinic should she have any questions or concerns prior to her return visit.     Follow up plan: - Return in about 3 months (around 05/05/2021) for Diabetes F/U with A1c in office, No previsit labs, ABI next visit, Bring meter and logs.    Rayetta Pigg, [facility] [address] [place], [place] [address] Phone: [phone] Fax: [phone]  02/02/2021, 10:12 AM

## 2021-02-02 NOTE — Patient Instructions (Signed)

## 2021-02-05 ENCOUNTER — Telehealth: Payer: Self-pay | Admitting: Nurse Practitioner

## 2021-02-05 MED ORDER — GLIPIZIDE 5 MG PO TABS: 5.0000 mg | ORAL_TABLET | Freq: Two times a day (BID) | ORAL | 1 refills | Status: DC

## 2021-02-05 NOTE — Telephone Encounter (Signed)
Pt is calling and states glipiZIDE (GLUCOTROL) 5 MG tablet    Needs to go to pharmacy below  [facility], [place] Phone:  [phone]  Fax:  [phone]

## 2021-02-05 NOTE — Telephone Encounter (Signed)
Rx sent 

## 2021-03-05 ENCOUNTER — Encounter: Payer: Self-pay | Admitting: Nutrition

## 2021-03-05 ENCOUNTER — Encounter: Payer: Medicare (Managed Care) | Attending: Nurse Practitioner | Admitting: Nutrition

## 2021-03-05 VITALS — Ht 63.0 in | Wt 232.0 lb

## 2021-03-05 DIAGNOSIS — E1122 Type 2 diabetes mellitus with diabetic chronic kidney disease: Secondary | ICD-10-CM | POA: Diagnosis not present

## 2021-03-05 DIAGNOSIS — N183 Chronic kidney disease, stage 3 unspecified: Secondary | ICD-10-CM | POA: Insufficient documentation

## 2021-03-05 DIAGNOSIS — I10 Essential (primary) hypertension: Secondary | ICD-10-CM | POA: Insufficient documentation

## 2021-03-05 DIAGNOSIS — E782 Mixed hyperlipidemia: Secondary | ICD-10-CM | POA: Insufficient documentation

## 2021-03-05 NOTE — Patient Instructions (Signed)
Goals  Drink 4 oz of Boost control 4 times per day. Drink 2 cups of water per day. Prevent low blood sugars Eat few bites every 2-3 hours. Cut out juice or soda, tea unless blood sugars is low.

## 2021-03-05 NOTE — Progress Notes (Signed)
Medical Nutrition Therapy  Appointment Start time:  0800  Appointment End time:  0900  Primary concerns today: Diabetes Type 2, Obesity  Referral diagnosis: E11.8, E66.9 Preferred learning style: no preference indicated Learning readiness: change in progress   NUTRITION ASSESSMENT  Current diet is insuffient to meet her nutritional needs. She has ongoing anorexia. Doesn't eat much because of no appetite. Is a picky eater also and doesn't like most vegetable, fruits Has back pain and seeing a neurologist for that soon. Apparently has pancreatitis. Not on any creon or pancreas enzymes presently.   Has CKD Stg 3. A1C down to 7.2% from 11.2% BS are poorly controlled due to inconsistent diet and most likely pancreatitis . Not able to exercise due to back pain.  Live with her daughter, who is her care taker. Obesity due to lack of nutrient dense foods and lack of exercise.  Anthropometrics  Wt Readings from Last 3 Encounters:  03/05/21 232 lb (105.2 kg)  02/02/21 235 lb 12.8 oz (107 kg)  01/29/21 244 lb (110.7 kg)   Ht Readings from Last 3 Encounters:  03/05/21 5\' 3"  (1.6 m)  02/02/21 5\' 3"  (1.6 m)  01/29/21 5\' 3"  (1.6 m)   Body mass index is 41.1 kg/m. @BMIFA @ Facility age limit for growth percentiles is 20 years. Facility age limit for growth percentiles is 20 years.    Clinical Medical Hx: Kidney cancer- 2018; Still has 1/2  of her left kidney and has her whole kidney on her left. Medications: Tresiba 25, Trajenta and Glipizide 5 mg BID. Labs:  Lab Results  Component Value Date   HGBA1C 7.2 12/26/2020   CMP Latest Ref Rng & Units 12/26/2020 04/13/2018 12/10/2017  Glucose 65 - 99 mg/dL - 132(H) 135(H)  BUN 4 - 21 15 17 16   Creatinine 0.5 - 1.1 1.6(A) 1.46(H) 1.25(H)  Sodium 135 - 146 mmol/L - 145 143  Potassium 3.5 - 5.3 mmol/L - 4.5 4.0  Chloride 98 - 110 mmol/L - 104 102  CO2 20 - 32 mmol/L - 30 30  Calcium 8.6 - 10.4 mg/dL - 10.4 10.0  Total Protein 6.1 - 8.1 g/dL -  7.7 7.3  Total Bilirubin 0.2 - 1.2 mg/dL - 0.4 0.5  AST 10 - 35 U/L - 25 20  ALT 6 - 29 U/L - 21 20   Lab Results  Component Value Date   HGBA1C 7.2 12/26/2020    Notable Signs/Symptoms: Increased thirsty, no appetite, fatigue, blurry vision, frequent urination  Lifestyle & Dietary Hx Has a bulging disc in her back. Getting PT right now. Doesn't know if she'll need surgery. Lives with her daughter, who is her caretaker. Has pancreatitis in May 2022 twice.    Estimated daily fluid intake: 24 oz Supplements: iron,, Flaxseed,  Sleep: sleeps a lot Stress / self-care: her back problems, health issues Current average weekly physical activity: N/A  24-Hr Dietary Recall First Meal: Susage biscuit 1/2, Sprite 20 oz Snack:  Second Meal: skipped Snack: Koolaid Third Meal: Chili, sweet peas, water Snack:  Beverages: 2-3 cups, soda or tea  Estimated Energy Needs Calories: 1200 Carbohydrate: 135g Protein: 60g Fat: 40g   NUTRITION DIAGNOSIS  NB-1.1 Food and nutrition-related knowledge deficit As related to Diabetes Type 2.  As evidenced by A1C 7,2%, down from 11%.   NUTRITION INTERVENTION  Nutrition education (E-1) on the following topics:  Nutrition and Diabetes education provided on My Plate, CHO counting, meal planning, portion sizes, timing of meals, avoiding snacks between meals unless  having a low blood sugar, target ranges for A1C and blood sugars, signs/symptoms and treatment of hyper/hypoglycemia, monitoring blood sugars, taking medications as prescribed, benefits of exercising 30 minutes per day and prevention of complications of DM.   Handouts Provided Include  My Plate Meal Plan Card   Learning Style & Readiness for Change Teaching method utilized: Visual & Auditory  Demonstrated degree of understanding via: Teach Back  Barriers to learning/adherence to lifestyle change: Mobility  Goals Established by Pt .Goals  Drink 4 oz of Boost control 4 times per  day. Drink 2 cups of water per day. Prevent low blood sugars Eat few bites every 2-3 hours. Cut out juice or soda, tea unless blood sugars is low.   MONITORING & EVALUATION Dietary intake, weekly physical activity, and blood sugars in 1 month.  Next Steps  Patient is to work on meal planning and eating small frequent meals.   Recommend to evaluate for the need for possible pancreatic enzymes and or appetite stimulant.Marland Kitchen

## 2021-03-07 ENCOUNTER — Other Ambulatory Visit: Payer: Self-pay | Admitting: Nurse Practitioner

## 2021-03-07 DIAGNOSIS — E1122 Type 2 diabetes mellitus with diabetic chronic kidney disease: Secondary | ICD-10-CM

## 2021-03-07 DIAGNOSIS — N183 Chronic kidney disease, stage 3 unspecified: Secondary | ICD-10-CM

## 2021-03-09 ENCOUNTER — Other Ambulatory Visit: Payer: Self-pay | Admitting: Internal Medicine

## 2021-03-09 DIAGNOSIS — N1832 Chronic kidney disease, stage 3b: Secondary | ICD-10-CM

## 2021-04-05 ENCOUNTER — Other Ambulatory Visit: Payer: Medicare (Managed Care)

## 2021-04-18 ENCOUNTER — Ambulatory Visit
Admission: RE | Admit: 2021-04-18 | Discharge: 2021-04-18 | Disposition: A | Discharge status: Home / Self Care | Payer: Medicare (Managed Care) | Source: Ambulatory Visit | Attending: Internal Medicine | Admitting: Internal Medicine

## 2021-04-18 ENCOUNTER — Other Ambulatory Visit: Payer: Self-pay

## 2021-04-18 DIAGNOSIS — N1832 Chronic kidney disease, stage 3b: Secondary | ICD-10-CM

## 2021-05-08 ENCOUNTER — Encounter: Payer: Self-pay | Admitting: Nurse Practitioner

## 2021-05-08 ENCOUNTER — Encounter: Payer: Medicare (Managed Care) | Attending: Nurse Practitioner | Admitting: Nutrition

## 2021-05-08 ENCOUNTER — Other Ambulatory Visit: Payer: Self-pay

## 2021-05-08 ENCOUNTER — Encounter: Payer: Self-pay | Admitting: Nutrition

## 2021-05-08 ENCOUNTER — Ambulatory Visit (INDEPENDENT_AMBULATORY_CARE_PROVIDER_SITE_OTHER): Payer: Medicare (Managed Care) | Admitting: Nurse Practitioner

## 2021-05-08 VITALS — BP 128/78 | HR 69 | Ht 63.0 in | Wt 232.0 lb

## 2021-05-08 DIAGNOSIS — E782 Mixed hyperlipidemia: Secondary | ICD-10-CM

## 2021-05-08 DIAGNOSIS — N1832 Chronic kidney disease, stage 3b: Secondary | ICD-10-CM

## 2021-05-08 DIAGNOSIS — Z794 Long term (current) use of insulin: Secondary | ICD-10-CM

## 2021-05-08 DIAGNOSIS — E1122 Type 2 diabetes mellitus with diabetic chronic kidney disease: Secondary | ICD-10-CM | POA: Diagnosis present

## 2021-05-08 DIAGNOSIS — N183 Chronic kidney disease, stage 3 unspecified: Secondary | ICD-10-CM

## 2021-05-08 DIAGNOSIS — I10 Essential (primary) hypertension: Secondary | ICD-10-CM | POA: Diagnosis present

## 2021-05-08 LAB — POCT GLYCOSYLATED HEMOGLOBIN (HGB A1C): HbA1c, POC (controlled diabetic range): 6.2 % (ref 0.0–7.0)

## 2021-05-08 MED ORDER — OMEPRAZOLE 40 MG PO CPDR: 40.0000 mg | DELAYED_RELEASE_CAPSULE | Freq: Every day | ORAL | 1 refills | Status: AC

## 2021-05-08 NOTE — Progress Notes (Signed)
Endocrinology Follow Up Note       05/08/2021, 11:03 AM   Subjective:    Patient ID: Abigail Jackson, female    DOB: October 01, 1965.  Abigail Jackson is being seen in follow up after being seen in follow up after being seen in consultation for management of currently uncontrolled symptomatic diabetes requested by  Pcp, No.   Past Medical History:  Diagnosis Date   Arthritis    Diabetes mellitus without complication ([place])    Heart murmur    Hyperlipidemia    Hypertension    Irregular bowel habits    Renal neoplasm    LEFT    Past Surgical History:  Procedure Laterality Date   ROBOTIC ASSITED PARTIAL NEPHRECTOMY Left 08/03/2015   Procedure: ROBOTIC ASSISTED PARTIAL LEFT NEPHRECTOMY;  Surgeon: Raynelle Bring, MD;  Location: [facility];  Service: Urology;  Laterality: Left;   SHOULDER SURGERY  2006   RIGHT    Social History   Socioeconomic History   Marital status: Married    Spouse name: Not on file   Number of children: Not on file   Years of education: Not on file   Highest education level: Not on file  Occupational History   Not on file  Tobacco Use   Smoking status: Never   Smokeless tobacco: Never  Vaping Use   Vaping Use: Never used  Substance and Sexual Activity   Alcohol use: No   Drug use: No   Sexual activity: Not on file  Other Topics Concern   Not on file  Social History Narrative   Not on file   Social Determinants of Health   Financial Resource Strain: Not on file  Food Insecurity: Not on file  Transportation Needs: Not on file  Physical Activity: Not on file  Stress: Not on file  Social Connections: Not on file    Family History  Problem Relation Age of Onset   Diabetes Mother    Hypertension Mother    Diabetes Father    Hypertension Father    Thyroid disease Father    Hyperlipidemia Father    Kidney disease Father    Heart failure Father     Outpatient Encounter  Medications as of 05/08/2021  Medication Sig   cefdinir (OMNICEF) 300 MG capsule Take 300 mg by mouth 2 (two) times daily.   guaifenesin (ROBITUSSIN) 100 MG/5ML syrup Take 200 mg by mouth 3 (three) times daily as needed for cough.   acetaminophen-codeine (TYLENOL #3) 300-30 MG tablet Take 1 tablet by mouth 3 (three) times daily as needed for pain.   aspirin EC 81 MG tablet Take 81 mg by mouth daily.   blood glucose meter kit and supplies KIT Dispense based on patient and insurance preference. Use up to four times daily as directed. (FOR ICD-10 E11.65)   cholecalciferol (VITAMIN D) 400 units TABS tablet Take 400 Units by mouth.   dicyclomine (BENTYL) 10 MG capsule Take 1 capsule by mouth 2 (two) times daily.   Easy Touch Lancets 30G MISC USE TO TEST BLOOD SUGAR 4 TIMES DAILY AS DIRECTED   glipiZIDE (GLUCOTROL) 5 MG tablet Take 1 tablet (5 mg total) by mouth 2 (two)  times daily with a meal.   glucose blood test strip 1 each by Other route 4 (four) times daily. Use as instructed 4 x daily. E11.65   hydrochlorothiazide (HYDRODIURIL) 25 MG tablet Take 25 mg by mouth daily.   HYDROcodone-acetaminophen (NORCO/VICODIN) 5-325 MG tablet Take 1 tablet by mouth every 6 (six) hours as needed for severe pain.   insulin degludec (TRESIBA FLEXTOUCH) 100 UNIT/ML FlexTouch Pen Inject 25 Units into the skin daily.   Insulin Pen Needle (B-D ULTRAFINE III SHORT PEN) 31G X 8 MM MISC 1 each by Does not apply route as directed. Use with insulin pen qhs   lisinopril (PRINIVIL,ZESTRIL) 10 MG tablet Take 10 mg by mouth daily.   methocarbamol (ROBAXIN) 500 MG tablet Take 1 tablet (500 mg total) by mouth 2 (two) times daily.   metoprolol succinate (TOPROL-XL) 50 MG 24 hr tablet Take 1 tablet (50 mg total) by mouth daily. Take with or immediately following a meal. (Patient taking differently: Take 50 mg by mouth 2 (two) times daily. Take with or immediately following a meal.)   omeprazole (PRILOSEC) 40 MG capsule Take 1 capsule  (40 mg total) by mouth at bedtime.   [DISCONTINUED] amitriptyline (ELAVIL) 100 MG tablet Take 100 mg by mouth at bedtime.  (Patient not taking: Reported on 01/18/2021)   [DISCONTINUED] etodolac (LODINE) 400 MG tablet Take 400 mg by mouth 2 (two) times daily. (Patient not taking: Reported on 02/02/2021)   [DISCONTINUED] Ferrous Sulfate (IRON) 325 (65 Fe) MG TABS Take by mouth. (Patient not taking: Reported on 01/18/2021)   [DISCONTINUED] Flaxseed, Linseed, (FLAXSEED OIL PO) Take 1,000 mg by mouth daily.   [DISCONTINUED] linagliptin (TRADJENTA) 5 MG TABS tablet Take 5 mg by mouth daily.   [DISCONTINUED] omeprazole (PRILOSEC) 40 MG capsule Take 40 mg by mouth at bedtime.   [DISCONTINUED] pravastatin (PRAVACHOL) 40 MG tablet Take 40 mg by mouth at bedtime. (Patient not taking: Reported on 01/18/2021)   No facility-administered encounter medications on file as of 05/08/2021.    ALLERGIES: Allergies  Allergen Reactions   Tramadol Itching    VACCINATION STATUS:  There is no immunization history on file for this patient.  Diabetes She presents for her follow-up diabetic visit. She has type 2 diabetes mellitus. Onset time: Diagnosed at approx age of 44. Her disease course has been improving. There are no hypoglycemic associated symptoms. There are no diabetic associated symptoms. There are no hypoglycemic complications. Symptoms are stable. Diabetic complications include nephropathy. Risk factors for coronary artery disease include diabetes mellitus, dyslipidemia, family history, obesity, hypertension and sedentary lifestyle. Current diabetic treatment includes oral agent (dual therapy) and insulin injections. She is compliant with treatment most of the time. Her weight is stable. She is following a generally healthy diet. When asked about meal planning, she reported none. She has not had a previous visit with a dietitian. She rarely participates in exercise. Her home blood glucose trend is decreasing  steadily. Her breakfast blood glucose range is generally 110-130 mg/dl. Her overall blood glucose range is 180-200 mg/dl. (She presents today, accompanied by a family member, with her logs and meter showing at goal fasting and postprandial glycemic profile.  Her POCT A1c today is 6.2%, improving from last visit of 7.2%.  She has been on several courses of oral steroids and had steroid injection in her back in which her glucose readings did temporarily increase mildly.  She denies any significant hypoglycemia.) An ACE inhibitor/angiotensin II receptor blocker is being taken. She does not see  a podiatrist.Eye exam is current.  Hyperlipidemia This is a chronic problem. The current episode started more than 1 year ago. The problem is uncontrolled. Recent lipid tests were reviewed and are high. Exacerbating diseases include chronic renal disease, diabetes and obesity. Factors aggravating her hyperlipidemia include beta blockers, thiazides and fatty foods. Current antihyperlipidemic treatment includes statins. The current treatment provides moderate improvement of lipids. Compliance problems include adherence to diet and adherence to exercise.  Risk factors for coronary artery disease include diabetes mellitus, dyslipidemia, family history, hypertension, obesity and a sedentary lifestyle.  Hypertension This is a chronic problem. The current episode started more than 1 year ago. The problem has been rapidly improving since onset. The problem is controlled. There are no associated agents to hypertension. Risk factors for coronary artery disease include diabetes mellitus, dyslipidemia, family history, obesity and sedentary lifestyle. Past treatments include beta blockers, diuretics and ACE inhibitors. The current treatment provides moderate improvement. There are no compliance problems.  Hypertensive end-organ damage includes kidney disease. with hx of left nephrectomy. Identifiable causes of hypertension include chronic  renal disease and sleep apnea.    Review of systems  Constitutional: + Minimally fluctuating body weight,  current Body mass index is 41.1 kg/m. , no fatigue, no subjective hyperthermia, no subjective hypothermia Eyes: no blurry vision, no xerophthalmia ENT: no sore throat, no nodules palpated in throat, no dysphagia/odynophagia, no hoarseness Cardiovascular: no chest pain, no shortness of breath, no palpitations, no leg swelling Respiratory: no cough, no shortness of breath Gastrointestinal: no nausea/vomiting/diarrhea Musculoskeletal: c/o intermittent back pain- recently had steroid injection- walks with walker Skin: no rashes, no hyperemia Neurological: no tremors, no numbness, no tingling, no dizziness Psychiatric: no depression, no anxiety  Objective:     BP 128/78   Pulse 69   Ht _0  (1.6 m)   Wt 232 lb (105.2 kg)   BMI 41.10 kg/m   Wt Readings from Last 3 Encounters:  05/08/21 232 lb (105.2 kg)  03/05/21 232 lb (105.2 kg)  02/02/21 235 lb 12.8 oz (107 kg)     BP Readings from Last 3 Encounters:  05/08/21 128/78  02/02/21 92/61  01/30/21 135/84     Physical Exam- Limited  Constitutional:  Body mass index is 41.1 kg/m., not in acute distress, normal state of mind Eyes:  EOMI, no exophthalmos Neck: Supple Cardiovascular: RRR, no murmurs, rubs, or gallops, no edema Respiratory: Adequate breathing efforts, no crackles, rales, rhonchi, or wheezing Musculoskeletal: no gross deformities, strength intact in all four extremities, no gross restriction of joint movements, walks with walker Skin:  no rashes, no hyperemia Neurological: no tremor with outstretched hands  .POCT ABI Results 05/08/21   Right ABI:  1.16      Left ABI:  1.12  Right leg systolic / diastolic: 563/87 mmHg Left leg systolic / diastolic: 564/33 mmHg  Arm systolic / diastolic: 295/18 mmHG  Detailed report will be scanned into patient chart.  Diabetic Foot Exam - Simple   Simple Foot  Form Diabetic Foot exam was performed with the following findings: Yes 05/08/2021 11:00 AM  Visual Inspection See comments: Yes Sensation Testing Intact to touch and monofilament testing bilaterally: Yes Pulse Check Posterior Tibialis and Dorsalis pulse intact bilaterally: Yes Comments Mild onychomycosis bilaterally      CMP ( most recent) CMP     Component Value Date/Time   NA 145 04/13/2018 1113   K 4.5 04/13/2018 1113   CL 104 04/13/2018 1113   CO2 30 04/13/2018 1113  GLUCOSE 132 (H) 04/13/2018 1113   BUN 15 12/26/2020 0000   CREATININE 1.6 (A) 12/26/2020 0000   CREATININE 1.46 (H) 04/13/2018 1113   CALCIUM 10.4 04/13/2018 1113   PROT 7.7 04/13/2018 1113   AST 25 04/13/2018 1113   ALT 21 04/13/2018 1113   BILITOT 0.4 04/13/2018 1113   GFRNONAA 41 (L) 04/13/2018 1113   GFRAA 39 12/26/2020 0000   GFRAA 47 (L) 04/13/2018 1113     Diabetic Labs (most recent): Lab Results  Component Value Date   HGBA1C 6.2 05/08/2021   HGBA1C 7.2 12/26/2020   HGBA1C 11.1 (H) 04/13/2018     Lipid Panel ( most recent) Lipid Panel     Component Value Date/Time   CHOL 194 12/10/2017 0844   TRIG 165 (H) 12/10/2017 0844   HDL 42 (L) 12/10/2017 0844   CHOLHDL 4.6 12/10/2017 0844   LDLCALC 124 (H) 12/10/2017 0844      Lab Results  Component Value Date   TSH 1.96 08/30/2020   TSH 2.547 08/06/2015           Assessment & Plan:   1) Uncontrolled Type 2 Diabetes with kidney complications:  She presents today, accompanied by a family member, with her logs and meter showing at goal fasting and postprandial glycemic profile.  Her POCT A1c today is 6.2%, improving from last visit of 7.2%.  She has been on several courses of oral steroids and had steroid injection in her back in which her glucose readings did temporarily increase mildly.  She denies any significant hypoglycemia.  - Abigail Jackson has currently uncontrolled symptomatic type 2 DM since 55 years of age.   -Recent  labs reviewed.  - I had a long discussion with her about the progressive nature of diabetes and the pathology behind its complications. -her diabetes is complicated by CKD 3 and she remains at a high risk for more acute and chronic complications which include CAD, CVA, CKD, retinopathy, and neuropathy. These are all discussed in detail with her.  - Nutritional counseling repeated at each appointment due to patients tendency to fall back in to old habits.  - The patient admits there is a room for improvement in their diet and drink choices. -  Suggestion is made for the patient to avoid simple carbohydrates from their diet including Cakes, Sweet Desserts / Pastries, Ice Cream, Soda (diet and regular), Sweet Tea, Candies, Chips, Cookies, Sweet Pastries, Store Bought Juices, Alcohol in Excess of 1-2 drinks a day, Artificial Sweeteners, Coffee Creamer, and "Sugar-free" Products. This will help patient to have stable blood glucose profile and potentially avoid unintended weight gain.   - I encouraged the patient to switch to unprocessed or minimally processed complex starch and increased protein intake (animal or plant source), fruits, and vegetables.   - Patient is advised to stick to a routine mealtimes to eat 3 meals a day and avoid unnecessary snacks (to snack only to correct hypoglycemia).  - she will be scheduled with Jearld Fenton, RDN, CDE for diabetes education.  - I have approached her with the following individualized plan to manage her diabetes and patient agrees:   -Based on her stable, at goal glycemic profile, she is advised to continue Tresiba 25 units SQ nightly, Tradjenta 5 mg po daily and Glipizide 5 mg po twice daily with meals.   -She is encouraged to continue monitoring blood glucose twice daily, before breakfast and before bed, and to call the clinic if she has readings less than  70 or greater than 300 for 3 tests in a row.    - she is warned not to take insulin without  proper monitoring per orders. - Adjustment parameters are given to her for hypo and hyperglycemia in writing.  - she is not a candidate for Metformin due to concurrent renal insufficiency.  - Specific targets for  A1c; LDL, HDL, and Triglycerides were discussed with the patient.  2) Blood Pressure /Hypertension:  her blood pressure is controlled to target.   she is advised to continue her current medications including HCTZ 25 mg po daily, Lisinopril 10 mg p.o. daily, and Metoprolol 50 mg po twice daily with breakfast.  She is encouraged to follow up with her PCP regarding her BP readings (although she reports the low readings "come and go").  3) Lipids/Hyperlipidemia:    Review of her recent lipid panel from 12/26/20 showed uncontrolled LDL at 132 .  she is advised to continue Pravastatin 40 mg daily at bedtime.  Side effects and precautions discussed with her.  4)  Weight/Diet:  her Body mass index is 41.1 kg/m.  -  clearly complicating her diabetes care.   she is a candidate for weight loss. I discussed with her the fact that loss of 5 - 10% of her  current body weight will have the most impact on her diabetes management.  Exercise, and detailed carbohydrates information provided  -  detailed on discharge instructions.  5) Chronic Care/Health Maintenance: -she is on ACEI/ARB and Statin medications and is encouraged to initiate and continue to follow up with Ophthalmology, Dentist, Podiatrist at least yearly or according to recommendations, and advised to stay away from smoking. I have recommended yearly flu vaccine and pneumonia vaccine at least every 5 years; moderate intensity exercise for up to 150 minutes weekly; and sleep for at least 7 hours a day.  - she is advised to maintain close follow up with Pcp, No for primary care needs, as well as her other providers for optimal and coordinated care.       I spent 40 minutes in the care of the patient today including review of labs from  [place], Lipids, Thyroid Function, Hematology (current and previous including abstractions from other facilities); face-to-face time discussing  her blood glucose readings/logs, discussing hypoglycemia and hyperglycemia episodes and symptoms, medications doses, her options of short and long term treatment based on the latest standards of care / guidelines;  discussion about incorporating lifestyle medicine;  and documenting the encounter.    Please refer to Patient Instructions for Blood Glucose Monitoring and Insulin/Medications Dosing Guide"  in media tab for additional information. Please  also refer to " Patient Self Inventory" in the Media  tab for reviewed elements of pertinent patient history.  Abigail Jackson participated in the discussions, expressed understanding, and voiced agreement with the above plans.  All questions were answered to her satisfaction. she is encouraged to contact clinic should she have any questions or concerns prior to her return visit.     Follow up plan: - Return in about 4 months (around 09/07/2021) for Diabetes F/U- A1c and UM in office, Previsit labs, Bring meter and logs.    Rayetta Pigg, [facility] [address] [place], [place] [address] Phone: [phone] Fax: [phone]  05/08/2021, 11:03 AM

## 2021-05-08 NOTE — Patient Instructions (Signed)
GOials  Cut out lemonade, sodas, tea and only water with lemon Cut cheese balls and cheese puffs Eat more fruit Try string cheese.instead of  kraft cheese Dont' skip meals.

## 2021-05-08 NOTE — Progress Notes (Signed)
Medical Nutrition Therapy  Diabetes Follow up   [place]  Primary concerns today: Diabetes Type 2, Obesity  Referral diagnosis: E11.8, E66.9 Preferred learning style: no preference indicated Learning readiness: change in progress   NUTRITION ASSESSMENT   Changes made:  Lost 3 lbs lbs since visit in June 2022.  A1C down 6.2%. from 7.3%. Her daughter been working on providing better food choices. Has had some GERD issues. Her daughter is her caregiver now and has been avoiding buying the junk food and sodas that Abigail Jackson has been buying in the past.  Still only eating 1-2 meals per day. Craves a lot of sugar and salty foods. Struggles with giving up these foods, but her daughter is doing a good job of not buying them. Using Omeprazole.  Taking 25 units of Tresiba and Glipizide. Going back to see PCP at [facility]. Currently taking 25 units of Tresiba and Glipizide daily and Trajenta FBS 100-130's and usually evening BS 120-140's. BS are much improved.  Anthropometrics  Wt Readings from Last 3 Encounters:  03/05/21 232 lb (105.2 kg)  02/02/21 235 lb 12.8 oz (107 kg)  01/29/21 244 lb (110.7 kg)   Ht Readings from Last 3 Encounters:  03/05/21 '5\' 3"'$  (1.6 m)  02/02/21 '5\' 3"'$  (1.6 m)  01/29/21 '5\' 3"'$  (1.6 m)   There is no height or weight on file to calculate BMI. '@BMIFA'$ @ Facility age limit for growth percentiles is 20 years. Facility age limit for growth percentiles is 20 years.    Clinical Medical Hx: Kidney cancer- 2018; Still has 1/2  of her left kidney and has her whole kidney on her left. Medications: Tresiba 25, Trajenta and Glipizide 5 mg BID. Labs:  Lab Results  Component Value Date   HGBA1C 7.2 12/26/2020   CMP Latest Ref Rng & Units 12/26/2020 04/13/2018 12/10/2017  Glucose 65 - 99 mg/dL - 132(H) 135(H)  BUN 4 - '21 15 17 16  '$ Creatinine 0.5 - 1.1 1.6(A) 1.46(H) 1.25(H)  Sodium 135 - 146 mmol/L - 145 143  Potassium 3.5 - 5.3 mmol/L - 4.5 4.0  Chloride 98 - 110 mmol/L  - 104 102  CO2 20 - 32 mmol/L - 30 30  Calcium 8.6 - 10.4 mg/dL - 10.4 10.0  Total Protein 6.1 - 8.1 g/dL - 7.7 7.3  Total Bilirubin 0.2 - 1.2 mg/dL - 0.4 0.5  AST 10 - 35 U/L - 25 20  ALT 6 - 29 U/L - 21 20   Lab Results  Component Value Date   HGBA1C 7.2 12/26/2020    Notable Signs/Symptoms: Increased thirsty, no appetite, fatigue, blurry vision, frequent urination  Lifestyle & Dietary Hx Has a bulging disc in her back. Getting PT right now. Doesn't know if she'll need surgery. Lives with her daughter, who is her caretaker. Has pancreatitis in May 2022 twice.    Estimated daily fluid intake: 24 oz Supplements: none Sleep: sleeps a lot Stress / self-care: her back problems, health issues Current average weekly physical activity: N/A  24-Hr Dietary Recall First Meal: egg1, cheese, jelly, lemonaide, water. Snack:  Second Meal: skipped Snack: Koolaid Third Meal: Meatloaf, chicken,  Snack:  Beverages: 2-3 cups, soda or tea/lemonade  Estimated Energy Needs Calories: 1200 Carbohydrate: 135g Protein: 60g Fat: 40g   NUTRITION DIAGNOSIS  NB-1.1 Food and nutrition-related knowledge deficit As related to Diabetes Type 2.  As evidenced by A1C 7,2%, down from 11%.   NUTRITION INTERVENTION  Nutrition education (E-1) on the following topics:  Nutrition  and Diabetes education provided on My Plate, CHO counting, meal planning, portion sizes, timing of meals, avoiding snacks between meals unless having a low blood sugar, target ranges for A1C and blood sugars, signs/symptoms and treatment of hyper/hypoglycemia, monitoring blood sugars, taking medications as prescribed, benefits of exercising 30 minutes per day and prevention of complications of DM.   Handouts Provided Include  My Plate Meal Plan Card   Learning Style & Readiness for Change Teaching method utilized: Visual & Auditory  Demonstrated degree of understanding via: Teach Back  Barriers to learning/adherence to  lifestyle change: Mobility  Goals Established by Pt Goals  Cut out lemonade, sodas, tea and only water with lemon Cut out cheese balls and cheese puffs Eat more fruit and vegetables Try string cheese.instead of  kraft cheese Dont' skip meals.   MONITORING & EVALUATION Dietary intake, weekly physical activity, and blood sugars in 3 month.  Next Steps  Patient is to work on meal planning and eating small frequent meals.   Recommend to evaluate for the need for possible pancreatic enzymes and or appetite stimulant.Marland Kitchen

## 2021-05-08 NOTE — Patient Instructions (Signed)

## 2021-06-21 ENCOUNTER — Telehealth: Payer: Self-pay

## 2021-06-21 NOTE — Telephone Encounter (Signed)
Patient called and said her readings are as followed- 10/24- 62, 103 10/25- 54, 96 10/26- 94, 217 10/27- 77

## 2021-06-21 NOTE — Telephone Encounter (Signed)
Those are too low, we need to lower her insulin a bit.  Let's have her lower her Tresiba to 15 units SQ nightly.  She can continue to take the Tradjenta and Glipizide as discussed last visit.  If she continues to have low readings after a few days on the lower dose, have her call back and we will adjust it more.

## 2021-06-21 NOTE — Telephone Encounter (Signed)
Patient notified

## 2021-07-05 ENCOUNTER — Other Ambulatory Visit: Payer: Self-pay | Admitting: "Endocrinology

## 2021-07-05 DIAGNOSIS — E1122 Type 2 diabetes mellitus with diabetic chronic kidney disease: Secondary | ICD-10-CM

## 2021-07-05 DIAGNOSIS — N183 Chronic kidney disease, stage 3 unspecified: Secondary | ICD-10-CM

## 2021-08-06 ENCOUNTER — Telehealth: Payer: Self-pay | Admitting: Nurse Practitioner

## 2021-08-06 NOTE — Telephone Encounter (Signed)
Patient called and said that her sugar has been extremely low in the mornings. She is sleeping all the time. She is wondering if she could just take her insulin only when her sugar is high. She said she is taking it at night and then when she wakes up it is low.    Date Before breakfast Before lunch Before supper Bedtime  12/8 55   171  12/9 106   210  12/10 29   249  12/11 57   240 (she felt so bad ,so she drank some lemonade)  12/12   67

## 2021-08-06 NOTE — Telephone Encounter (Signed)
Patient will stop her insulin for now and call us in a week

## 2021-08-06 NOTE — Telephone Encounter (Signed)
Have her stop her insulin altogether for now and update me in a week with her glucose readings.

## 2021-08-22 NOTE — Telephone Encounter (Signed)
Pt called since the change. Testing morning and night.  12/24 67 -- 134  12/25 76 -- 183  12/26 85 -- 219  12/27 181 -- 263  12/28 139

## 2021-08-22 NOTE — Telephone Encounter (Signed)
Much better!  Have her continue on with the current plan until she sees me again.

## 2021-08-22 NOTE — Telephone Encounter (Signed)
Called and informed patient. 

## 2021-09-03 ENCOUNTER — Other Ambulatory Visit: Payer: Self-pay | Admitting: Nurse Practitioner

## 2021-09-04 LAB — COMPLETE METABOLIC PANEL WITH GFR
AG Ratio: 1.8 (calc) (ref 1.0–2.5)
ALT: 19 U/L (ref 6–29)
AST: 22 U/L (ref 10–35)
Albumin: 4.4 g/dL (ref 3.6–5.1)
Alkaline phosphatase (APISO): 33 U/L — ABNORMAL LOW (ref 37–153)
BUN/Creatinine Ratio: 12 (calc) (ref 6–22)
BUN: 17 mg/dL (ref 7–25)
CO2: 28 mmol/L (ref 20–32)
Calcium: 10.3 mg/dL (ref 8.6–10.4)
Chloride: 106 mmol/L (ref 98–110)
Creat: 1.44 mg/dL — ABNORMAL HIGH (ref 0.50–1.03)
Globulin: 2.5 g/dL (calc) (ref 1.9–3.7)
Glucose, Bld: 130 mg/dL — ABNORMAL HIGH (ref 65–99)
Potassium: 4.3 mmol/L (ref 3.5–5.3)
Sodium: 142 mmol/L (ref 135–146)
Total Bilirubin: 0.4 mg/dL (ref 0.2–1.2)
Total Protein: 6.9 g/dL (ref 6.1–8.1)
eGFR: 42 mL/min/{1.73_m2} — ABNORMAL LOW (ref >=60)

## 2021-09-04 LAB — LIPID PANEL
Cholesterol: 233 mg/dL — ABNORMAL HIGH (ref <200)
HDL: 56 mg/dL (ref >=50)
LDL Cholesterol (Calc): 159 mg/dL (calc) — ABNORMAL HIGH
Non-HDL Cholesterol (Calc): 177 mg/dL (calc) — ABNORMAL HIGH (ref <130)
Total CHOL/HDL Ratio: 4.2 (calc) (ref <5.0)
Triglycerides: 79 mg/dL (ref <150)

## 2021-09-04 LAB — TSH: TSH: 2.44 mIU/L (ref 0.40–4.50)

## 2021-09-04 LAB — T4, FREE: Free T4: 1.1 ng/dL (ref 0.8–1.8)

## 2021-09-10 ENCOUNTER — Encounter: Payer: Self-pay | Admitting: Nurse Practitioner

## 2021-09-10 ENCOUNTER — Encounter: Payer: 59 | Attending: Nurse Practitioner | Admitting: Nutrition

## 2021-09-10 ENCOUNTER — Encounter: Payer: Self-pay | Admitting: Nutrition

## 2021-09-10 ENCOUNTER — Ambulatory Visit (INDEPENDENT_AMBULATORY_CARE_PROVIDER_SITE_OTHER): Payer: 59 | Admitting: Nurse Practitioner

## 2021-09-10 ENCOUNTER — Other Ambulatory Visit: Payer: Self-pay

## 2021-09-10 VITALS — Wt 236.0 lb

## 2021-09-10 VITALS — BP 113/67 | HR 73 | Ht 63.0 in | Wt 236.0 lb

## 2021-09-10 DIAGNOSIS — E782 Mixed hyperlipidemia: Secondary | ICD-10-CM | POA: Insufficient documentation

## 2021-09-10 DIAGNOSIS — I10 Essential (primary) hypertension: Secondary | ICD-10-CM

## 2021-09-10 DIAGNOSIS — N183 Chronic kidney disease, stage 3 unspecified: Secondary | ICD-10-CM | POA: Insufficient documentation

## 2021-09-10 DIAGNOSIS — E1122 Type 2 diabetes mellitus with diabetic chronic kidney disease: Secondary | ICD-10-CM | POA: Insufficient documentation

## 2021-09-10 DIAGNOSIS — N1832 Chronic kidney disease, stage 3b: Secondary | ICD-10-CM

## 2021-09-10 LAB — POCT GLYCOSYLATED HEMOGLOBIN (HGB A1C): HbA1c, POC (controlled diabetic range): 7.2 % — AB (ref 0.0–7.0)

## 2021-09-10 NOTE — Progress Notes (Signed)
Endocrinology Follow Up Note       09/10/2021, 10:45 AM   Subjective:    Patient ID: Abigail Jackson, female    DOB: [birth year].  Abigail Jackson is being seen in follow up after being seen in follow up after being seen in consultation for management of currently uncontrolled symptomatic diabetes requested by  [facility].   Past Medical History:  Diagnosis Date   Arthritis    Diabetes mellitus without complication ([place])    Heart murmur    Hyperlipidemia    Hypertension    Irregular bowel habits    Renal neoplasm    LEFT    Past Surgical History:  Procedure Laterality Date   ROBOTIC ASSITED PARTIAL NEPHRECTOMY Left 08/03/2015   Procedure: ROBOTIC ASSISTED PARTIAL LEFT NEPHRECTOMY;  Surgeon: Raynelle Bring, MD;  Location: [facility];  Service: Urology;  Laterality: Left;   SHOULDER SURGERY  2006   RIGHT    Social History   Socioeconomic History   Marital status: Married    Spouse name: Not on file   Number of children: Not on file   Years of education: Not on file   Highest education level: Not on file  Occupational History   Not on file  Tobacco Use   Smoking status: Never   Smokeless tobacco: Never  Vaping Use   Vaping Use: Never used  Substance and Sexual Activity   Alcohol use: No   Drug use: No   Sexual activity: Not on file  Other Topics Concern   Not on file  Social History Narrative   Not on file   Social Determinants of Health   Financial Resource Strain: Not on file  Food Insecurity: Not on file  Transportation Needs: Not on file  Physical Activity: Not on file  Stress: Not on file  Social Connections: Not on file    Family History  Problem Relation Age of Onset   Diabetes Mother    Hypertension Mother    Diabetes Father    Hypertension Father    Thyroid disease Father    Hyperlipidemia Father    Kidney disease Father    Heart failure Father      Outpatient Encounter Medications as of 09/10/2021  Medication Sig   acetaminophen (TYLENOL) 325 MG tablet Take by mouth.   amitriptyline (ELAVIL) 100 MG tablet Take 100 mg by mouth at bedtime.   aspirin 81 MG EC tablet 1 tablet   atorvastatin (LIPITOR) 20 MG tablet Take 20 mg by mouth daily.   blood glucose meter kit and supplies KIT Dispense based on patient and insurance preference. Use up to four times daily as directed. (FOR ICD-10 E11.65)   cefdinir (OMNICEF) 300 MG capsule Take 300 mg by mouth 2 (two) times daily.   cholecalciferol (VITAMIN D) 400 units TABS tablet Take 400 Units by mouth.   diclofenac Sodium (VOLTAREN) 1 % GEL    dicyclomine (BENTYL) 10 MG capsule Take 1 capsule by mouth 2 (two) times daily.   Easy Touch Lancets 30G MISC USE AS DIRECTED TO TEST BLOOD GLUCOSE FOUR TIMES A DAY   fenofibrate (TRICOR) 145 MG tablet Take 145 mg by  mouth daily.   glipiZIDE (GLUCOTROL) 5 MG tablet TAKE 1 TABLET BY MOUTH TWICE DAILY WITH A MEAL   glucose blood (ONETOUCH ULTRA) test strip USE TO TEST BLOOD SUGAR FOUR TIMES A DAY AS DIRECTED   glucose blood test strip 1 each by Other route 4 (four) times daily. Use as instructed 4 x daily. E11.65   hydrochlorothiazide (HYDRODIURIL) 25 MG tablet Take 25 mg by mouth daily.   HYDROcodone-acetaminophen (NORCO/VICODIN) 5-325 MG tablet Take 1 tablet by mouth every 6 (six) hours as needed for severe pain.   Insulin Pen Needle (B-D ULTRAFINE III SHORT PEN) 31G X 8 MM MISC 1 each by Does not apply route as directed. Use with insulin pen qhs   lisinopril (ZESTRIL) 5 MG tablet Take 5 mg by mouth daily.   LORazepam (ATIVAN) 1 MG tablet Take 1 the night before dental appointment. Take 1 one hour before dental appt. Take 1 before dental appt. Need driver.   meclizine (ANTIVERT) 25 MG tablet Take 1 tablet by mouth daily as needed.   methocarbamol (ROBAXIN) 500 MG tablet Take 1 tablet (500 mg total) by mouth 2 (two) times daily.   metoprolol succinate  (TOPROL-XL) 50 MG 24 hr tablet Take 1 tablet (50 mg total) by mouth daily. Take with or immediately following a meal. (Patient taking differently: Take 50 mg by mouth 2 (two) times daily. Take with or immediately following a meal.)   nystatin cream (MYCOSTATIN) Apply topically.   omeprazole (PRILOSEC) 40 MG capsule Take 1 capsule (40 mg total) by mouth at bedtime.   ondansetron (ZOFRAN) 4 MG tablet Take 4 mg by mouth every 8 (eight) hours as needed.   oxyCODONE (OXY IR/ROXICODONE) 5 MG immediate release tablet Take by mouth.   pregabalin (LYRICA) 100 MG capsule Take 100 mg by mouth 2 (two) times daily.   pregabalin (LYRICA) 50 MG capsule Take 50 mg by mouth 2 (two) times daily.   TRADJENTA 5 MG TABS tablet Take 5 mg by mouth daily.   [DISCONTINUED] acetaminophen-codeine (TYLENOL #3) 300-30 MG tablet Take 1 tablet by mouth 3 (three) times daily as needed for pain. (Patient not taking: Reported on 09/10/2021)   [DISCONTINUED] aspirin EC 81 MG tablet Take 81 mg by mouth daily. (Patient not taking: Reported on 09/10/2021)   [DISCONTINUED] Cholecalciferol (VITAMIN D3) 10 MCG (400 UNIT) tablet Take 1 tablet by mouth daily. (Patient not taking: Reported on 09/10/2021)   [DISCONTINUED] dicyclomine (BENTYL) 10 MG capsule Take by mouth. (Patient not taking: Reported on 09/10/2021)   [DISCONTINUED] guaifenesin (ROBITUSSIN) 100 MG/5ML syrup Take 200 mg by mouth 3 (three) times daily as needed for cough. (Patient not taking: Reported on 09/10/2021)   [DISCONTINUED] insulin degludec (TRESIBA FLEXTOUCH) 100 UNIT/ML FlexTouch Pen Inject 25 Units into the skin daily. (Patient not taking: Reported on 09/10/2021)   [DISCONTINUED] lisinopril (PRINIVIL,ZESTRIL) 10 MG tablet Take 10 mg by mouth daily. (Patient not taking: Reported on 09/10/2021)   No facility-administered encounter medications on file as of 09/10/2021.    ALLERGIES: Allergies  Allergen Reactions   Tramadol Itching and Rash    VACCINATION STATUS:  There  is no immunization history on file for this patient.  Diabetes She presents for her follow-up diabetic visit. She has type 2 diabetes mellitus. Onset time: Diagnosed at approx age of 65. Her disease course has been stable. Hypoglycemia symptoms include sweats and tremors. There are no diabetic associated symptoms. There are no hypoglycemic complications. Symptoms are stable. Diabetic complications include nephropathy. Risk factors  for coronary artery disease include diabetes mellitus, dyslipidemia, family history, obesity, hypertension and sedentary lifestyle. Current diabetic treatment includes oral agent (dual therapy) (stopped insulin in between visits due to hypoglycemia). She is compliant with treatment most of the time. Her weight is stable. She is following a generally healthy diet. When asked about meal planning, she reported none. She has not had a previous visit with a dietitian. She rarely participates in exercise. Her home blood glucose trend is decreasing steadily. Her breakfast blood glucose range is generally 110-130 mg/dl. Her overall blood glucose range is 180-200 mg/dl. (She presents today with her meter and logs showing at target fasting and slightly above target postprandial glycemic profile.  Her POCT A1c today is 7.2%, increasing from last visit of 6.2%.  Her insulin was stopped in between visits for significant hypoglycemia.  She does admit to drinking fruit juices which she notices elevates her blood glucose significantly.) An ACE inhibitor/angiotensin II receptor blocker is being taken. She does not see a podiatrist.Eye exam is current.  Hyperlipidemia This is a chronic problem. The current episode started more than 1 year ago. The problem is uncontrolled. Recent lipid tests were reviewed and are high. Exacerbating diseases include chronic renal disease, diabetes and obesity. Factors aggravating her hyperlipidemia include beta blockers, thiazides and fatty foods. Current  antihyperlipidemic treatment includes statins. The current treatment provides moderate improvement of lipids. Compliance problems include adherence to diet and adherence to exercise.  Risk factors for coronary artery disease include diabetes mellitus, dyslipidemia, family history, hypertension, obesity and a sedentary lifestyle.  Hypertension This is a chronic problem. The current episode started more than 1 year ago. The problem has been resolved since onset. The problem is controlled. Associated symptoms include sweats. There are no associated agents to hypertension. Risk factors for coronary artery disease include diabetes mellitus, dyslipidemia, family history, obesity and sedentary lifestyle. Past treatments include beta blockers, diuretics and ACE inhibitors. The current treatment provides moderate improvement. There are no compliance problems.  Hypertensive end-organ damage includes kidney disease. with hx of left nephrectomy. Identifiable causes of hypertension include chronic renal disease and sleep apnea.    Review of systems  Constitutional: + Minimally fluctuating body weight,  current Body mass index is 41.81 kg/m. , no fatigue, no subjective hyperthermia, no subjective hypothermia Eyes: no blurry vision, no xerophthalmia ENT: no sore throat, no nodules palpated in throat, no dysphagia/odynophagia, no hoarseness Cardiovascular: no chest pain, no shortness of breath, no palpitations, no leg swelling Respiratory: no cough, no shortness of breath Gastrointestinal: no nausea/vomiting/diarrhea Musculoskeletal: c/o intermittent back pain- walks with walker Skin: no rashes, no hyperemia Neurological: no tremors, no numbness, no tingling, no dizziness Psychiatric: no depression, no anxiety  Objective:     BP 113/67    Pulse 73    Ht '5\' 3"'  (1.6 m)    Wt 236 lb (107 kg)    SpO2 99%    BMI 41.81 kg/m   Wt Readings from Last 3 Encounters:  09/10/21 236 lb (107 kg)  05/08/21 232 lb (105.2  kg)  03/05/21 232 lb (105.2 kg)     BP Readings from Last 3 Encounters:  09/10/21 113/67  05/08/21 128/78  02/02/21 92/61     Physical Exam- Limited  Constitutional:  Body mass index is 41.81 kg/m., not in acute distress, normal state of mind Eyes:  EOMI, no exophthalmos Neck: Supple Cardiovascular: RRR, no murmurs, rubs, or gallops, no edema Respiratory: Adequate breathing efforts, no crackles, rales, rhonchi, or wheezing Musculoskeletal:  no gross deformities, strength intact in all four extremities, no gross restriction of joint movements, walks with walker Skin:  no rashes, no hyperemia Neurological: no tremor with outstretched hands    Diabetic Foot Exam - Simple   No data filed      CMP ( most recent) CMP     Component Value Date/Time   NA 142 09/03/2021 0807   K 4.3 09/03/2021 0807   CL 106 09/03/2021 0807   CO2 28 09/03/2021 0807   GLUCOSE 130 (H) 09/03/2021 0807   BUN 17 09/03/2021 0807   BUN 15 12/26/2020 0000   CREATININE 1.44 (H) 09/03/2021 0807   CALCIUM 10.3 09/03/2021 0807   PROT 6.9 09/03/2021 0807   AST 22 09/03/2021 0807   ALT 19 09/03/2021 0807   BILITOT 0.4 09/03/2021 0807   GFRNONAA 41 (L) 04/13/2018 1113   GFRAA 39 12/26/2020 0000   GFRAA 47 (L) 04/13/2018 1113     Diabetic Labs (most recent): Lab Results  Component Value Date   HGBA1C 7.2 (A) 09/10/2021   HGBA1C 6.2 05/08/2021   HGBA1C 7.2 12/26/2020     Lipid Panel ( most recent) Lipid Panel     Component Value Date/Time   CHOL 233 (H) 09/03/2021 0807   TRIG 79 09/03/2021 0807   HDL 56 09/03/2021 0807   CHOLHDL 4.2 09/03/2021 0807   LDLCALC 159 (H) 09/03/2021 0807      Lab Results  Component Value Date   TSH 2.44 09/03/2021   TSH 1.96 08/30/2020   TSH 2.547 08/06/2015   FREET4 1.1 09/03/2021           Assessment & Plan:   1) Uncontrolled Type 2 Diabetes with kidney complications:  She presents today with her meter and logs showing at target fasting and  slightly above target postprandial glycemic profile.  Her POCT A1c today is 7.2%, increasing from last visit of 6.2%.  Her insulin was stopped in between visits for significant hypoglycemia.  She does admit to drinking fruit juices which she notices elevates her blood glucose significantly.  - Abigail Jackson has currently uncontrolled symptomatic type 2 DM since 56 years of age.   -Recent labs reviewed.  - I had a long discussion with her about the progressive nature of diabetes and the pathology behind its complications. -her diabetes is complicated by CKD 3 and she remains at a high risk for more acute and chronic complications which include CAD, CVA, CKD, retinopathy, and neuropathy. These are all discussed in detail with her.  - Nutritional counseling repeated at each appointment due to patients tendency to fall back in to old habits.  - The patient admits there is a room for improvement in their diet and drink choices. -  Suggestion is made for the patient to avoid simple carbohydrates from their diet including Cakes, Sweet Desserts / Pastries, Ice Cream, Soda (diet and regular), Sweet Tea, Candies, Chips, Cookies, Sweet Pastries, Store Bought Juices, Alcohol in Excess of 1-2 drinks a day, Artificial Sweeteners, Coffee Creamer, and "Sugar-free" Products. This will help patient to have stable blood glucose profile and potentially avoid unintended weight gain.   - I encouraged the patient to switch to unprocessed or minimally processed complex starch and increased protein intake (animal or plant source), fruits, and vegetables.   - Patient is advised to stick to a routine mealtimes to eat 3 meals a day and avoid unnecessary snacks (to snack only to correct hypoglycemia).  - she will be scheduled with  Jearld Fenton, RDN, CDE for diabetes education.  - I have approached her with the following individualized plan to manage her diabetes and patient agrees:   -Based on her stable glycemic  profile, she is advised to continue Tradjenta 5 mg po daily and Glipizide 5 mg po twice daily with meals.  She can stay off basal insulin for now.  -She is encouraged to continue monitoring blood glucose twice daily, before breakfast and before bed, and to call the clinic if she has readings less than 70 or greater than 300 for 3 tests in a row.    - she is warned not to take insulin without proper monitoring per orders. - Adjustment parameters are given to her for hypo and hyperglycemia in writing.  - she is not a candidate for Metformin due to concurrent renal insufficiency.  - Specific targets for  A1c; LDL, HDL, and Triglycerides were discussed with the patient.  2) Blood Pressure /Hypertension:  her blood pressure is controlled to target.   she is advised to continue her current medications including HCTZ 25 mg po daily, Lisinopril 5 mg p.o. daily, and Metoprolol 50 mg po twice daily with breakfast.   3) Lipids/Hyperlipidemia:    Review of her recent lipid panel from 09/03/21 showed uncontrolled LDL at 159 .  she is advised to continue Lipitor 20 mg daily at bedtime and Tricor 145 mg po daily.  Side effects and precautions discussed with her.  4)  Weight/Diet:  her Body mass index is 41.81 kg/m.  -  clearly complicating her diabetes care.   she is a candidate for weight loss. I discussed with her the fact that loss of 5 - 10% of her  current body weight will have the most impact on her diabetes management.  Exercise, and detailed carbohydrates information provided  -  detailed on discharge instructions.  5) Chronic Care/Health Maintenance: -she is on ACEI/ARB and Statin medications and is encouraged to initiate and continue to follow up with Ophthalmology, Dentist, Podiatrist at least yearly or according to recommendations, and advised to stay away from smoking. I have recommended yearly flu vaccine and pneumonia vaccine at least every 5 years; moderate intensity exercise for up to 150  minutes weekly; and sleep for at least 7 hours a day.  - she is advised to maintain close follow up with [facility] for primary care needs, as well as her other providers for optimal and coordinated care.       I spent 40 minutes in the care of the patient today including review of labs from [facility], Lipids, Thyroid Function, Hematology (current and previous including abstractions from other facilities); face-to-face time discussing  her blood glucose readings/logs, discussing hypoglycemia and hyperglycemia episodes and symptoms, medications doses, her options of short and long term treatment based on the latest standards of care / guidelines;  discussion about incorporating lifestyle medicine;  and documenting the encounter.    Please refer to Patient Instructions for Blood Glucose Monitoring and Insulin/Medications Dosing Guide"  in media tab for additional information. Please  also refer to " Patient Self Inventory" in the Media  tab for reviewed elements of pertinent patient history.  Abigail Jackson participated in the discussions, expressed understanding, and voiced agreement with the above plans.  All questions were answered to her satisfaction. she is encouraged to contact clinic should she have any questions or concerns prior to her return visit.     Follow up plan: - Return  in about 3 months (around 12/09/2021) for Diabetes F/U with A1c in office, No previsit labs, Bring meter and logs.    Rayetta Pigg, [facility] [address] [place], [place] [address] Phone: [phone] Fax: [phone]  09/10/2021, 10:45 AM

## 2021-09-10 NOTE — Patient Instructions (Signed)

## 2021-09-10 NOTE — Patient Instructions (Signed)
Eat three meals a day   Drink more water   Cut out bbq chips and snack on vegetables instead  Get A1C to 6.5

## 2021-09-10 NOTE — Progress Notes (Signed)
Medical Nutrition Therapy  Diabetes Follow up   Begin 1030 End1100  Primary concerns today: Diabetes Type 2, Obesity  Referral diagnosis: E11.8, E66.9 Preferred learning style: no preference indicated Learning readiness: change in progress   NUTRITION ASSESSMENT   Changes made: None Saw Abigail Jackson today  Eating more to correct lows Has been trying to drink more water Eating a lot of BBQ potato chips.Skips meals. Only eating 1 meal per day. A1C up to 7.2%.   Anthropometrics  Wt Readings from Last 3 Encounters:  09/10/21 236 lb (107 kg)  05/08/21 232 lb (105.2 kg)  03/05/21 232 lb (105.2 kg)   Ht Readings from Last 3 Encounters:  09/10/21 5\' 3"  (1.6 m)  05/08/21 5\' 3"  (1.6 m)  03/05/21 5\' 3"  (1.6 m)   There is no height or weight on file to calculate BMI. @BMIFA @ Facility age limit for growth percentiles is 20 years. Facility age limit for growth percentiles is 20 years.    Clinical Medical Hx: Kidney cancer- 2018; Still has 1/2  of her left kidney and has her whole kidney on her left. Medications: Tresiba 25, Trajenta and Glipizide 5 mg BID. Labs:  Lab Results  Component Value Date   HGBA1C 7.2 (A) 09/10/2021   CMP Latest Ref Rng & Units 09/03/2021 12/26/2020 04/13/2018  Glucose 65 - 99 mg/dL 130(H) - 132(H)  BUN 7 - 25 mg/dL 17 15 17   Creatinine 0.50 - 1.03 mg/dL 1.44(H) 1.6(A) 1.46(H)  Sodium 135 - 146 mmol/L 142 - 145  Potassium 3.5 - 5.3 mmol/L 4.3 - 4.5  Chloride 98 - 110 mmol/L 106 - 104  CO2 20 - 32 mmol/L 28 - 30  Calcium 8.6 - 10.4 mg/dL 10.3 - 10.4  Total Protein 6.1 - 8.1 g/dL 6.9 - 7.7  Total Bilirubin 0.2 - 1.2 mg/dL 0.4 - 0.4  AST 10 - 35 U/L 22 - 25  ALT 6 - 29 U/L 19 - 21   Lab Results  Component Value Date   HGBA1C 7.2 (A) 09/10/2021    Notable Signs/Symptoms: Increased thirsty, no appetite, fatigue, blurry vision, frequent urination  Lifestyle & Dietary Hx Has a bulging disc in her back. Getting PT right now. Doesn't know if she'll need  surgery. Lives with her daughter, who is her caretaker. Has pancreatitis in May 2022 twice.    Estimated daily fluid intake: 24 oz Supplements: none Sleep: sleeps a lot Stress / self-care: her back problems, health issues Current average weekly physical activity: N/A  24-Hr Dietary Recall First Meal: sausage, egg1, cheese biscuit , jelly, lemonaide, water or skip Snack:  Second Meal: skipped Snack:  Third Meal: skips. Snack:  Beverages: 2-3 cups, soda or tea/lemonade, water, soda  Estimated Energy Needs Calories: 1200 Carbohydrate: 135g Protein: 60g Fat: 40g   NUTRITION DIAGNOSIS  NB-1.1 Food and nutrition-related knowledge deficit As related to Diabetes Type 2.  As evidenced by A1C 7,2%, down from 11%.   NUTRITION INTERVENTION  Nutrition education (E-1) on the following topics:  Nutrition and Diabetes education provided on My Plate, CHO counting, meal planning, portion sizes, timing of meals, avoiding snacks between meals unless having a low blood sugar, target ranges for A1C and blood sugars, signs/symptoms and treatment of hyper/hypoglycemia, monitoring blood sugars, taking medications as prescribed, benefits of exercising 30 minutes per day and prevention of complications of DM.   Handouts Provided Include  My Plate Meal Plan Card   Learning Style & Readiness for Change Teaching method utilized: Visual & Auditory  Demonstrated degree of understanding via: Teach Back  Barriers to learning/adherence to lifestyle change: Mobility  Goals Established by Pt Goals Eat three meals a day   Drink more water   Cut out bbq chips and snack on vegetables instead  Get A1C to 6.5   MONITORING & EVALUATION Dietary intake, weekly physical activity, and blood sugars in 3 month.  Next Steps  Patient is to work on meal planning and eating small frequent meals.

## 2021-09-12 ENCOUNTER — Telehealth: Payer: Self-pay | Admitting: "Endocrinology

## 2021-09-12 NOTE — Telephone Encounter (Signed)
Called patient and she wanted to know if she needed to check her blood sugar at lunch time now that she needs to eat lunch. I advised that she needs to continue checking her blood glucose twice daily per note. Patient verbalized an understanding.

## 2021-09-12 NOTE — Telephone Encounter (Signed)
Patient is asking for the nurse to return her call in regards to pricking her finger

## 2021-09-18 NOTE — Telephone Encounter (Signed)
Patient left a VM asking about checking her sugar again. Can you call this pt?

## 2021-09-18 NOTE — Telephone Encounter (Signed)
Called patient and she wanted to know if she should check her blood glucose at lunch and I advised for her to check twice daily per last OV note. Patient stated that she is also keeping a food journal and will bring this in at her next appointment in April. Patient verbalized an understanding.

## 2021-10-08 ENCOUNTER — Telehealth: Payer: Self-pay | Admitting: Nurse Practitioner

## 2021-10-08 NOTE — Telephone Encounter (Signed)
Looks like we may need to restart basal insulin versus GLP1.  Can we schedule her to come back soon so we can discuss face-to-face?  Thanks!

## 2021-10-08 NOTE — Telephone Encounter (Signed)
Pt can not get a ride to come until Friday 2/17. Scheduled her for that morning

## 2021-10-08 NOTE — Telephone Encounter (Signed)
Pt called and wanted me to give you her readings. She is testing 2x a day, morning and night.  2/10 251, 350  2/11 290, 289  2/12 288, 301  2/13 232

## 2021-10-08 NOTE — Telephone Encounter (Signed)
Ok, sounds good.

## 2021-10-12 ENCOUNTER — Encounter: Payer: Self-pay | Admitting: Nurse Practitioner

## 2021-10-12 ENCOUNTER — Ambulatory Visit (INDEPENDENT_AMBULATORY_CARE_PROVIDER_SITE_OTHER): Payer: 59 | Admitting: Nurse Practitioner

## 2021-10-12 ENCOUNTER — Other Ambulatory Visit: Payer: Self-pay

## 2021-10-12 VITALS — BP 126/68 | HR 76 | Ht 63.0 in | Wt 235.2 lb

## 2021-10-12 DIAGNOSIS — E1122 Type 2 diabetes mellitus with diabetic chronic kidney disease: Secondary | ICD-10-CM | POA: Diagnosis not present

## 2021-10-12 DIAGNOSIS — N1832 Chronic kidney disease, stage 3b: Secondary | ICD-10-CM

## 2021-10-12 DIAGNOSIS — I10 Essential (primary) hypertension: Secondary | ICD-10-CM

## 2021-10-12 DIAGNOSIS — E782 Mixed hyperlipidemia: Secondary | ICD-10-CM

## 2021-10-12 MED ORDER — TRESIBA FLEXTOUCH 100 UNIT/ML ~~LOC~~ SOPN: 15.0000 [IU] | PEN_INJECTOR | Freq: Every day | SUBCUTANEOUS | 3 refills | Status: DC

## 2021-10-12 NOTE — Progress Notes (Signed)
Endocrinology Follow Up Note       10/12/2021, 11:04 AM   Subjective:    Patient ID: Abigail Jackson, female    DOB: [birth year].  Abigail Jackson is being seen in follow up after being seen in follow up after being seen in consultation for management of currently uncontrolled symptomatic diabetes requested by  [facility].   Past Medical History:  Diagnosis Date   Arthritis    Diabetes mellitus without complication ([place])    Heart murmur    Hyperlipidemia    Hypertension    Irregular bowel habits    Renal neoplasm    LEFT    Past Surgical History:  Procedure Laterality Date   ROBOTIC ASSITED PARTIAL NEPHRECTOMY Left 08/03/2015   Procedure: ROBOTIC ASSISTED PARTIAL LEFT NEPHRECTOMY;  Surgeon: Raynelle Bring, MD;  Location: [facility];  Service: Urology;  Laterality: Left;   SHOULDER SURGERY  2006   RIGHT    Social History   Socioeconomic History   Marital status: Married    Spouse name: Not on file   Number of children: Not on file   Years of education: Not on file   Highest education level: Not on file  Occupational History   Not on file  Tobacco Use   Smoking status: Never   Smokeless tobacco: Never  Vaping Use   Vaping Use: Never used  Substance and Sexual Activity   Alcohol use: No   Drug use: No   Sexual activity: Not on file  Other Topics Concern   Not on file  Social History Narrative   Not on file   Social Determinants of Health   Financial Resource Strain: Not on file  Food Insecurity: Not on file  Transportation Needs: Not on file  Physical Activity: Not on file  Stress: Not on file  Social Connections: Not on file    Family History  Problem Relation Age of Onset   Diabetes Mother    Hypertension Mother    Diabetes Father    Hypertension Father    Thyroid disease Father    Hyperlipidemia Father    Kidney disease Father    Heart failure Father      Outpatient Encounter Medications as of 10/12/2021  Medication Sig   acetaminophen (TYLENOL) 325 MG tablet Take by mouth.   amitriptyline (ELAVIL) 100 MG tablet Take 100 mg by mouth at bedtime.   aspirin 81 MG EC tablet 1 tablet   atorvastatin (LIPITOR) 20 MG tablet Take 20 mg by mouth daily.   blood glucose meter kit and supplies KIT Dispense based on patient and insurance preference. Use up to four times daily as directed. (FOR ICD-10 E11.65)   cefdinir (OMNICEF) 300 MG capsule Take 300 mg by mouth 2 (two) times daily.   cholecalciferol (VITAMIN D) 400 units TABS tablet Take 400 Units by mouth.   diclofenac Sodium (VOLTAREN) 1 % GEL    dicyclomine (BENTYL) 10 MG capsule Take 1 capsule by mouth 2 (two) times daily.   Easy Touch Lancets 30G MISC USE AS DIRECTED TO TEST BLOOD GLUCOSE FOUR TIMES A DAY   fenofibrate (TRICOR) 145 MG tablet Take 145 mg by  mouth daily.   gabapentin (NEURONTIN) 100 MG capsule Take 100 mg by mouth 2 (two) times daily.   glipiZIDE (GLUCOTROL) 5 MG tablet TAKE 1 TABLET BY MOUTH TWICE DAILY WITH A MEAL   glucose blood (ONETOUCH ULTRA) test strip USE TO TEST BLOOD SUGAR FOUR TIMES A DAY AS DIRECTED   glucose blood test strip 1 each by Other route 4 (four) times daily. Use as instructed 4 x daily. E11.65   hydrochlorothiazide (HYDRODIURIL) 25 MG tablet Take 25 mg by mouth daily.   HYDROcodone-acetaminophen (NORCO/VICODIN) 5-325 MG tablet Take 1 tablet by mouth every 6 (six) hours as needed for severe pain.   insulin degludec (TRESIBA FLEXTOUCH) 100 UNIT/ML FlexTouch Pen Inject 15 Units into the skin at bedtime.   Insulin Pen Needle (B-D ULTRAFINE III SHORT PEN) 31G X 8 MM MISC 1 each by Does not apply route as directed. Use with insulin pen qhs   lisinopril (ZESTRIL) 5 MG tablet Take 5 mg by mouth daily.   LORazepam (ATIVAN) 1 MG tablet Take 1 the night before dental appointment. Take 1 one hour before dental appt. Take 1 before dental appt. Need driver.   meclizine  (ANTIVERT) 25 MG tablet Take 1 tablet by mouth daily as needed.   methocarbamol (ROBAXIN) 500 MG tablet Take 1 tablet (500 mg total) by mouth 2 (two) times daily.   metoprolol succinate (TOPROL-XL) 50 MG 24 hr tablet Take 1 tablet (50 mg total) by mouth daily. Take with or immediately following a meal. (Patient taking differently: Take 50 mg by mouth 2 (two) times daily. Take with or immediately following a meal.)   metoprolol tartrate (LOPRESSOR) 50 MG tablet Take 50 mg by mouth daily.   nystatin cream (MYCOSTATIN) Apply topically.   omeprazole (PRILOSEC) 40 MG capsule Take 1 capsule (40 mg total) by mouth at bedtime.   ondansetron (ZOFRAN) 4 MG tablet Take 4 mg by mouth every 8 (eight) hours as needed.   oxyCODONE (OXY IR/ROXICODONE) 5 MG immediate release tablet Take by mouth.   pregabalin (LYRICA) 100 MG capsule Take 100 mg by mouth 2 (two) times daily.   pregabalin (LYRICA) 50 MG capsule Take 50 mg by mouth 2 (two) times daily.   TRADJENTA 5 MG TABS tablet Take 5 mg by mouth daily.   No facility-administered encounter medications on file as of 10/12/2021.    ALLERGIES: Allergies  Allergen Reactions   Tramadol Itching and Rash    VACCINATION STATUS:  There is no immunization history on file for this patient.  Diabetes She presents for her follow-up diabetic visit. She has type 2 diabetes mellitus. Onset time: Diagnosed at approx age of 6. Her disease course has been worsening. There are no hypoglycemic associated symptoms. There are no diabetic associated symptoms. There are no hypoglycemic complications. Symptoms are stable. Diabetic complications include nephropathy. Risk factors for coronary artery disease include diabetes mellitus, dyslipidemia, family history, obesity, hypertension and sedentary lifestyle. Current diabetic treatment includes oral agent (dual therapy) (stopped insulin in between visits due to hypoglycemia). She is compliant with treatment most of the time. Her  weight is fluctuating minimally. She is following a generally unhealthy diet. When asked about meal planning, she reported none. She has not had a previous visit with a dietitian. She rarely participates in exercise. Her home blood glucose trend is increasing steadily. Her breakfast blood glucose range is generally >200 mg/dl. Her bedtime blood glucose range is generally >200 mg/dl. Her overall blood glucose range is >200 mg/dl. (She presents  today with her meter and logs showing gross hyperglycemia both fasting and postprandially.  She was not due for another A1c today.  She admits she has been eating/drinking things she knows she shouldn't.  She denies any hypoglycemia.) An ACE inhibitor/angiotensin II receptor blocker is being taken. She does not see a podiatrist.Eye exam is current.  Hyperlipidemia This is a chronic problem. The current episode started more than 1 year ago. The problem is uncontrolled. Recent lipid tests were reviewed and are high. Exacerbating diseases include chronic renal disease, diabetes and obesity. Factors aggravating her hyperlipidemia include beta blockers, thiazides and fatty foods. Current antihyperlipidemic treatment includes statins. The current treatment provides moderate improvement of lipids. Compliance problems include adherence to diet and adherence to exercise.  Risk factors for coronary artery disease include diabetes mellitus, dyslipidemia, family history, hypertension, obesity and a sedentary lifestyle.  Hypertension This is a chronic problem. The current episode started more than 1 year ago. The problem has been resolved since onset. The problem is controlled. There are no associated agents to hypertension. Risk factors for coronary artery disease include diabetes mellitus, dyslipidemia, family history, obesity and sedentary lifestyle. Past treatments include beta blockers, diuretics and ACE inhibitors. The current treatment provides moderate improvement. There are no  compliance problems.  Hypertensive end-organ damage includes kidney disease. with hx of left nephrectomy. Identifiable causes of hypertension include chronic renal disease and sleep apnea.    Review of systems  Constitutional: + Minimally fluctuating body weight,  current Body mass index is 41.66 kg/m. , no fatigue, no subjective hyperthermia, no subjective hypothermia Eyes: no blurry vision, no xerophthalmia ENT: no sore throat, no nodules palpated in throat, no dysphagia/odynophagia, no hoarseness Cardiovascular: no chest pain, no shortness of breath, no palpitations, no leg swelling Respiratory: no cough, no shortness of breath Gastrointestinal: no nausea/vomiting/diarrhea Musculoskeletal: c/o intermittent back pain- walks with walker Skin: no rashes, no hyperemia Neurological: no tremors, no numbness, no tingling, no dizziness Psychiatric: no depression, no anxiety  Objective:     BP 126/68    Pulse 76    Ht '5\' 3"'  (1.6 m)    Wt 235 lb 3.2 oz (106.7 kg)    SpO2 99%    BMI 41.66 kg/m   Wt Readings from Last 3 Encounters:  10/12/21 235 lb 3.2 oz (106.7 kg)  09/10/21 236 lb (107 kg)  09/10/21 236 lb (107 kg)     BP Readings from Last 3 Encounters:  10/12/21 126/68  09/10/21 113/67  05/08/21 128/78     Physical Exam- Limited  Constitutional:  Body mass index is 41.66 kg/m., not in acute distress, normal state of mind Eyes:  EOMI, no exophthalmos Neck: Supple Cardiovascular: RRR, no murmurs, rubs, or gallops, no edema Respiratory: Adequate breathing efforts, no crackles, rales, rhonchi, or wheezing Musculoskeletal: no gross deformities, strength intact in all four extremities, no gross restriction of joint movements, walks with walker Skin:  no rashes, no hyperemia Neurological: no tremor with outstretched hands    Diabetic Foot Exam - Simple   No data filed      CMP ( most recent) CMP     Component Value Date/Time   NA 142 09/03/2021 0807   K 4.3 09/03/2021  0807   CL 106 09/03/2021 0807   CO2 28 09/03/2021 0807   GLUCOSE 130 (H) 09/03/2021 0807   BUN 17 09/03/2021 0807   BUN 15 12/26/2020 0000   CREATININE 1.44 (H) 09/03/2021 0807   CALCIUM 10.3 09/03/2021 0807   PROT  6.9 09/03/2021 0807   AST 22 09/03/2021 0807   ALT 19 09/03/2021 0807   BILITOT 0.4 09/03/2021 0807   GFRNONAA 41 (L) 04/13/2018 1113   GFRAA 39 12/26/2020 0000   GFRAA 47 (L) 04/13/2018 1113     Diabetic Labs (most recent): Lab Results  Component Value Date   HGBA1C 7.2 (A) 09/10/2021   HGBA1C 6.2 05/08/2021   HGBA1C 7.2 12/26/2020     Lipid Panel ( most recent) Lipid Panel     Component Value Date/Time   CHOL 233 (H) 09/03/2021 0807   TRIG 79 09/03/2021 0807   HDL 56 09/03/2021 0807   CHOLHDL 4.2 09/03/2021 0807   LDLCALC 159 (H) 09/03/2021 0807      Lab Results  Component Value Date   TSH 2.44 09/03/2021   TSH 1.96 08/30/2020   TSH 2.547 08/06/2015   FREET4 1.1 09/03/2021           Assessment & Plan:   1) Uncontrolled Type 2 Diabetes with kidney complications:  She presents today with her meter and logs showing gross hyperglycemia both fasting and postprandially.  She was not due for another A1c today.  She admits she has been eating/drinking things she knows she shouldn't.  She denies any hypoglycemia.  - Abigail Jackson has currently uncontrolled symptomatic type 2 DM since 56 years of age.   -Recent labs reviewed.  - I had a long discussion with her about the progressive nature of diabetes and the pathology behind its complications. -her diabetes is complicated by CKD 3 and she remains at a high risk for more acute and chronic complications which include CAD, CVA, CKD, retinopathy, and neuropathy. These are all discussed in detail with her.  - Nutritional counseling repeated at each appointment due to patients tendency to fall back in to old habits.  - The patient admits there is a room for improvement in their diet and drink  choices. -  Suggestion is made for the patient to avoid simple carbohydrates from their diet including Cakes, Sweet Desserts / Pastries, Ice Cream, Soda (diet and regular), Sweet Tea, Candies, Chips, Cookies, Sweet Pastries, Store Bought Juices, Alcohol in Excess of 1-2 drinks a day, Artificial Sweeteners, Coffee Creamer, and "Sugar-free" Products. This will help patient to have stable blood glucose profile and potentially avoid unintended weight gain.   - I encouraged the patient to switch to unprocessed or minimally processed complex starch and increased protein intake (animal or plant source), fruits, and vegetables.   - Patient is advised to stick to a routine mealtimes to eat 3 meals a day and avoid unnecessary snacks (to snack only to correct hypoglycemia).  - she will be scheduled with Jearld Fenton, RDN, CDE for diabetes education.  - I have approached her with the following individualized plan to manage her diabetes and patient agrees:   -Given her gross hyperglycemia, she is advised to restart her Tresiba at 15 units SQ nightly (still has some at home which she will start).  She is advised to continue Tradjenta 5 mg po daily and Glipizide 5 mg po twice daily with meals.   -She is encouraged to continue monitoring blood glucose twice daily, before breakfast and before bed, and to call the clinic if she has readings less than 70 or greater than 300 for 3 tests in a row.    - she is warned not to take insulin without proper monitoring per orders. - Adjustment parameters are given to her for hypo and  hyperglycemia in writing.  - she is not a candidate for Metformin due to concurrent renal insufficiency.  - Specific targets for  A1c; LDL, HDL, and Triglycerides were discussed with the patient.  2) Blood Pressure /Hypertension:  her blood pressure is controlled to target.   she is advised to continue her current medications including HCTZ 25 mg po daily, Lisinopril 5 mg p.o. daily, and  Metoprolol 50 mg po twice daily with breakfast.   3) Lipids/Hyperlipidemia:    Review of her recent lipid panel from 09/03/21 showed uncontrolled LDL at 159 .  she is advised to continue Lipitor 20 mg daily at bedtime and Tricor 145 mg po daily.  Side effects and precautions discussed with her.  4)  Weight/Diet:  her Body mass index is 41.66 kg/m.  -  clearly complicating her diabetes care.   she is a candidate for weight loss. I discussed with her the fact that loss of 5 - 10% of her  current body weight will have the most impact on her diabetes management.  Exercise, and detailed carbohydrates information provided  -  detailed on discharge instructions.  5) Chronic Care/Health Maintenance: -she is on ACEI/ARB and Statin medications and is encouraged to initiate and continue to follow up with Ophthalmology, Dentist, Podiatrist at least yearly or according to recommendations, and advised to stay away from smoking. I have recommended yearly flu vaccine and pneumonia vaccine at least every 5 years; moderate intensity exercise for up to 150 minutes weekly; and sleep for at least 7 hours a day.  - she is advised to maintain close follow up with [facility] for primary care needs, as well as her other providers for optimal and coordinated care.       I spent 30 minutes in the care of the patient today including review of labs from [facility], Lipids, Thyroid Function, Hematology (current and previous including abstractions from other facilities); face-to-face time discussing  her blood glucose readings/logs, discussing hypoglycemia and hyperglycemia episodes and symptoms, medications doses, her options of short and long term treatment based on the latest standards of care / guidelines;  discussion about incorporating lifestyle medicine;  and documenting the encounter.    Please refer to Patient Instructions for Blood Glucose Monitoring and Insulin/Medications Dosing Guide"  in media  tab for additional information. Please  also refer to " Patient Self Inventory" in the Media  tab for reviewed elements of pertinent patient history.  Abigail Jackson participated in the discussions, expressed understanding, and voiced agreement with the above plans.  All questions were answered to her satisfaction. she is encouraged to contact clinic should she have any questions or concerns prior to her return visit.     Follow up plan: - Return in about 1 month (around 11/09/2021) for Diabetes F/U, Bring meter and logs.    Rayetta Pigg, [facility] [address] [place], [place] [address] Phone: [phone] Fax: [phone]  10/12/2021, 11:04 AM

## 2021-10-12 NOTE — Patient Instructions (Signed)

## 2021-10-15 ENCOUNTER — Telehealth: Payer: Self-pay

## 2021-10-15 NOTE — Telephone Encounter (Signed)
Patient called the after hours telephone line and wanted to know if she could use her Tyler Aas that she has in the fridge first since still good and not get Rx at her pharmacy.  I called this patient back and left a detailed voice message that it is fine for her to use what she has as long as in date. I also left a message for the patient to let her pharmacy know that she will get her refill at a later time.

## 2021-11-09 ENCOUNTER — Encounter: Payer: Self-pay | Admitting: Nurse Practitioner

## 2021-11-09 ENCOUNTER — Ambulatory Visit (INDEPENDENT_AMBULATORY_CARE_PROVIDER_SITE_OTHER): Payer: 59 | Admitting: Nurse Practitioner

## 2021-11-09 ENCOUNTER — Other Ambulatory Visit: Payer: Self-pay

## 2021-11-09 VITALS — BP 117/73 | HR 90 | Ht 63.0 in | Wt 235.0 lb

## 2021-11-09 DIAGNOSIS — I10 Essential (primary) hypertension: Secondary | ICD-10-CM | POA: Diagnosis not present

## 2021-11-09 DIAGNOSIS — E1122 Type 2 diabetes mellitus with diabetic chronic kidney disease: Secondary | ICD-10-CM

## 2021-11-09 DIAGNOSIS — N1832 Chronic kidney disease, stage 3b: Secondary | ICD-10-CM | POA: Diagnosis not present

## 2021-11-09 DIAGNOSIS — E782 Mixed hyperlipidemia: Secondary | ICD-10-CM | POA: Diagnosis not present

## 2021-11-09 MED ORDER — TRESIBA FLEXTOUCH 100 UNIT/ML ~~LOC~~ SOPN: 25.0000 [IU] | PEN_INJECTOR | Freq: Every day | SUBCUTANEOUS | 3 refills | Status: DC

## 2021-11-09 NOTE — Progress Notes (Signed)
? ?                                                    Endocrinology Follow Up Note  ?     11/09/2021, 10:12 AM ? ? ?Subjective:  ? ? Patient ID: Abigail Jackson, female    DOB: 11/07/1965.  ?Abigail Jackson is being seen in follow up after being seen in follow up after being seen in consultation for management of currently uncontrolled symptomatic diabetes requested by  [facility]. ? ? ?Past Medical History:  ?Diagnosis Date  ? Arthritis   ? Diabetes mellitus without complication ([place])   ? Heart murmur   ? Hyperlipidemia   ? Hypertension   ? Irregular bowel habits   ? 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 ? ?Past Surgical History:  ?Procedure Laterality Date  ? ROBOTIC ASSITED PARTIAL NEPHRECTOMY Left 08/03/2015  ? Procedure: ROBOTIC ASSISTED PARTIAL LEFT NEPHRECTOMY;  Surgeon: Raynelle Bring, MD;  Location: [facility];  Service: Urology;  Laterality: Left;  ? SHOULDER SURGERY  2006  ? RIGHT  ? ? ?Social History  ? ?Socioeconomic History  ? Marital status: Married  ?  Spouse name: Not on file  ? Number of children: Not on file  ? Years of education: Not on file  ? Highest education level: Not on file  ?Occupational History  ? Not on file  ?Tobacco Use  ? Smoking status: Never  ? Smokeless tobacco: Never  ?Vaping Use  ? Vaping Use: Never used  ?Substance and Sexual Activity  ? Alcohol use: No  ? Drug use: No  ? Sexual activity: Not on file  ?Other Topics Concern  ? Not on file  ?Social History Narrative  ? Not on file  ? ?Social Determinants of Health  ? ?Financial Resource Strain: Not on file  ?Food Insecurity: Not on file  ?Transportation Needs: Not on file  ?Physical Activity: Not on file  ?Stress: Not on file  ?Social Connections: Not on file  ? ? ?Family History  ?Problem Relation Age of Onset  ? Diabetes Mother   ? Hypertension Mother   ? Diabetes Father   ? Hypertension Father   ? Thyroid disease Father   ? Hyperlipidemia Father   ? Kidney disease Father   ? Heart failure Father    ? ? ?Outpatient Encounter Medications as of 11/09/2021  ?Medication Sig  ? acetaminophen (TYLENOL) 325 MG tablet Take by mouth.  ? amitriptyline (ELAVIL) 100 MG tablet Take 100 mg by mouth at bedtime.  ? aspirin 81 MG EC tablet 1 tablet  ? atorvastatin (LIPITOR) 20 MG tablet Take 20 mg by mouth daily.  ? blood glucose meter kit and supplies KIT Dispense based on patient and insurance preference. Use up to four times daily as directed. (FOR ICD-10 E11.65)  ? cefdinir (OMNICEF) 300 MG capsule Take 300 mg by mouth 2 (two) times daily.  ? cholecalciferol (VITAMIN D) 400 units TABS tablet Take 400 Units by mouth.  ? diclofenac Sodium (VOLTAREN) 1 % GEL   ? dicyclomine (BENTYL) 10 MG capsule Take 1 capsule by mouth 2 (two) times daily.  ? Easy Touch Lancets 30G MISC USE AS DIRECTED TO TEST BLOOD GLUCOSE FOUR TIMES A DAY  ? fenofibrate (TRICOR) 145 MG tablet Take 145 mg by  mouth daily.  ? gabapentin (NEURONTIN) 100 MG capsule Take 100 mg by mouth 2 (two) times daily.  ? glipiZIDE (GLUCOTROL) 5 MG tablet TAKE 1 TABLET BY MOUTH TWICE DAILY WITH A MEAL  ? glucose blood (ONETOUCH ULTRA) test strip USE TO TEST BLOOD SUGAR FOUR TIMES A DAY AS DIRECTED  ? glucose blood test strip 1 each by Other route 4 (four) times daily. Use as instructed 4 x daily. E11.65  ? hydrochlorothiazide (HYDRODIURIL) 25 MG tablet Take 25 mg by mouth daily.  ? HYDROcodone-acetaminophen (NORCO/VICODIN) 5-325 MG tablet Take 1 tablet by mouth every 6 (six) hours as needed for severe pain.  ? Insulin Pen Needle (B-D ULTRAFINE III SHORT PEN) 31G X 8 MM MISC 1 each by Does not apply route as directed. Use with insulin pen qhs  ? lisinopril (ZESTRIL) 5 MG tablet Take 5 mg by mouth daily.  ? LORazepam (ATIVAN) 1 MG tablet Take 1 the night before dental appointment. Take 1 one hour before dental appt. Take 1 before dental appt. Need driver.  ? meclizine (ANTIVERT) 25 MG tablet Take 1 tablet by mouth daily as needed.  ? methocarbamol (ROBAXIN) 500 MG tablet Take  1 tablet (500 mg total) by mouth 2 (two) times daily.  ? metoprolol succinate (TOPROL-XL) 50 MG 24 hr tablet Take 1 tablet (50 mg total) by mouth daily. Take with or immediately following a meal. (Patient taking differently: Take 50 mg by mouth 2 (two) times daily. Take with or immediately following a meal.)  ? metoprolol tartrate (LOPRESSOR) 50 MG tablet Take 50 mg by mouth daily.  ? nystatin cream (MYCOSTATIN) Apply topically.  ? omeprazole (PRILOSEC) 40 MG capsule Take 1 capsule (40 mg total) by mouth at bedtime.  ? ondansetron (ZOFRAN) 4 MG tablet Take 4 mg by mouth every 8 (eight) hours as needed.  ? oxyCODONE (OXY IR/ROXICODONE) 5 MG immediate release tablet Take by mouth.  ? pregabalin (LYRICA) 100 MG capsule Take 100 mg by mouth 2 (two) times daily.  ? pregabalin (LYRICA) 50 MG capsule Take 50 mg by mouth 2 (two) times daily.  ? TRADJENTA 5 MG TABS tablet Take 5 mg by mouth daily.  ? [DISCONTINUED] insulin degludec (TRESIBA FLEXTOUCH) 100 UNIT/ML FlexTouch Pen Inject 15 Units into the skin at bedtime.  ? insulin degludec (TRESIBA FLEXTOUCH) 100 UNIT/ML FlexTouch Pen Inject 25 Units into the skin at bedtime.  ? ?No facility-administered encounter medications on file as of 11/09/2021.  ? ? ?ALLERGIES: ?Allergies  ?Allergen Reactions  ? Tramadol Itching and Rash  ? ? ?VACCINATION STATUS: ? ?There is no immunization history on file for this patient. ? ?Diabetes ?She presents for her follow-up diabetic visit. She has type 2 diabetes mellitus. Onset time: Diagnosed at approx age of 19. Her disease course has been worsening. There are no hypoglycemic associated symptoms. There are no diabetic associated symptoms. There are no hypoglycemic complications. Symptoms are stable. Diabetic complications include nephropathy. Risk factors for coronary artery disease include diabetes mellitus, dyslipidemia, family history, obesity, hypertension and sedentary lifestyle. Current diabetic treatment includes oral agent (dual  therapy) and insulin injections. She is compliant with treatment most of the time. Her weight is fluctuating minimally. She is following a generally unhealthy diet. When asked about meal planning, she reported none. She has not had a previous visit with a dietitian. She rarely participates in exercise. Her home blood glucose trend is increasing steadily. Her breakfast blood glucose range is generally >200 mg/dl. Her bedtime blood glucose range  is generally >200 mg/dl. Her overall blood glucose range is >200 mg/dl. (She presents today with her logs, no meter, showing above target glycemic profile overall.  She ws not due for another A1c today.  She has been having trouble with her glucose since she was started on Prednisone for URI symptoms several weeks ago.  Between visits, she was started back on Tresiba to help cover her hyperglycemia.  She denies any hypoglycemia.) An ACE inhibitor/angiotensin II receptor blocker is being taken. She does not see a podiatrist.Eye exam is current.  ?Hyperlipidemia ?This is a chronic problem. The current episode started more than 1 year ago. The problem is uncontrolled. Recent lipid tests were reviewed and are high. Exacerbating diseases include chronic renal disease, diabetes and obesity. Factors aggravating her hyperlipidemia include beta blockers, thiazides and fatty foods. Current antihyperlipidemic treatment includes statins. The current treatment provides moderate improvement of lipids. Compliance problems include adherence to diet and adherence to exercise.  Risk factors for coronary artery disease include diabetes mellitus, dyslipidemia, family history, hypertension, obesity and a sedentary lifestyle.  ?Hypertension ?This is a chronic problem. The current episode started more than 1 year ago. The problem has been resolved since onset. The problem is controlled. There are no associated agents to hypertension. Risk factors for coronary artery disease include diabetes  mellitus, dyslipidemia, family history, obesity and sedentary lifestyle. Past treatments include beta blockers, diuretics and ACE inhibitors. The current treatment provides moderate improvement. There are no co

## 2021-11-09 NOTE — Patient Instructions (Signed)

## 2021-11-13 ENCOUNTER — Telehealth: Payer: Self-pay | Admitting: Nutrition

## 2021-11-13 NOTE — Telephone Encounter (Signed)
TC to patient to review her food journal that she dropped off at the office. Diet is low in fruits, vegetables,whole grains and high fiber foods. She admits she doesn't like any vegetables other than potatoes, peas and green beans. Doesn't like any other lower carb vegetables and not willing to try to learn to like them. Admist she needs to wait til she gets paid to get more fruit. Stressed the need for increase water and exercise. She verbalized understanding. FBS this am was 182 mg/dl. ?

## 2021-12-10 ENCOUNTER — Ambulatory Visit: Payer: 59 | Admitting: Nurse Practitioner

## 2021-12-10 ENCOUNTER — Ambulatory Visit: Payer: 59 | Admitting: Nutrition

## 2022-01-22 ENCOUNTER — Telehealth: Payer: Self-pay | Admitting: Nurse Practitioner

## 2022-01-22 DIAGNOSIS — E1122 Type 2 diabetes mellitus with diabetic chronic kidney disease: Secondary | ICD-10-CM

## 2022-01-22 MED ORDER — ONETOUCH ULTRA VI STRP: ORAL_STRIP | 3 refills | Status: DC

## 2022-01-22 MED ORDER — EASY TOUCH LANCETS 30G MISC: 1 refills | Status: DC

## 2022-01-22 NOTE — Telephone Encounter (Signed)
Rx Sent  

## 2022-01-22 NOTE — Telephone Encounter (Signed)
Patient is requesting her test strips, pen needles and lancets. Please send to [facility]

## 2022-02-01 ENCOUNTER — Telehealth: Payer: Self-pay | Admitting: Nurse Practitioner

## 2022-02-01 DIAGNOSIS — E1122 Type 2 diabetes mellitus with diabetic chronic kidney disease: Secondary | ICD-10-CM

## 2022-02-01 MED ORDER — TRESIBA FLEXTOUCH 100 UNIT/ML ~~LOC~~ SOPN: 25.0000 [IU] | PEN_INJECTOR | Freq: Every day | SUBCUTANEOUS | 0 refills | Status: DC

## 2022-02-01 MED ORDER — BD PEN NEEDLE SHORT U/F 31G X 8 MM MISC: 1.0000 | 2 refills | Status: DC

## 2022-02-01 NOTE — Telephone Encounter (Signed)
Rx refill(s) sent.

## 2022-02-01 NOTE — Telephone Encounter (Signed)
Pt requesting a refill on the following.  Pen needles and Tresiba    [place], [place] Phone:  [phone]  Fax:  [phone]

## 2022-02-05 LAB — HEPATIC FUNCTION PANEL
ALT: 23 U/L (ref 7–35)
AST: 23 (ref 13–35)
Alkaline Phosphatase: 43 (ref 25–125)
Bilirubin, Total: 0.4

## 2022-02-05 LAB — LIPID PANEL
Cholesterol: 216 — AB (ref 0–200)
HDL: 45 (ref 35–70)
LDL Cholesterol: 146
Triglycerides: 127 (ref 40–160)

## 2022-02-05 LAB — BASIC METABOLIC PANEL
BUN: 15 (ref 4–21)
CO2: 28 — AB (ref 13–22)
Chloride: 104 (ref 99–108)
Creatinine: 1.7 — AB (ref 0.5–1.1)
Glucose: 149
Potassium: 4.6 mEq/L (ref 3.5–5.1)
Sodium: 140 (ref 137–147)

## 2022-02-05 LAB — COMPREHENSIVE METABOLIC PANEL
Albumin: 4.2 (ref 3.5–5.0)
Calcium: 10.4 (ref 8.7–10.7)
Globulin: 2.8

## 2022-02-05 LAB — TSH: TSH: 1.41 (ref 0.41–5.90)

## 2022-02-05 LAB — MICROALBUMIN, URINE: Microalb, Ur: 0.5

## 2022-02-05 LAB — PROTEIN / CREATININE RATIO, URINE: Creatinine, Urine: 113

## 2022-02-05 LAB — HEMOGLOBIN A1C: Hemoglobin A1C: 9.3

## 2022-02-05 LAB — MICROALBUMIN / CREATININE URINE RATIO: Microalb Creat Ratio: 4

## 2022-02-05 LAB — VITAMIN D 25 HYDROXY (VIT D DEFICIENCY, FRACTURES): Vit D, 25-Hydroxy: 34

## 2022-02-07 ENCOUNTER — Encounter: Payer: Self-pay | Admitting: Nurse Practitioner

## 2022-02-12 NOTE — Patient Instructions (Signed)
Diabetes Mellitus and Foot Care Foot care is an important part of your health, especially when you have diabetes. Diabetes may cause you to have problems because of poor blood flow (circulation) to your feet and legs, which can cause your skin to: Become thinner and drier. Break more easily. Heal more slowly. Peel and crack. You may also have nerve damage (neuropathy) in your legs and feet, causing decreased feeling in them. This means that you may not notice minor injuries to your feet that could lead to more serious problems. Noticing and addressing any potential problems early is the best way to prevent future foot problems. How to care for your feet Foot hygiene  Wash your feet daily with warm water and mild soap. Do not use hot water. Then, pat your feet and the areas between your toes until they are completely dry. Do not soak your feet as this can dry your skin. Trim your toenails straight across. Do not dig under them or around the cuticle. File the edges of your nails with an emery board or nail file. Apply a moisturizing lotion or petroleum jelly to the skin on your feet and to dry, brittle toenails. Use lotion that does not contain alcohol and is unscented. Do not apply lotion between your toes. Shoes and socks Wear clean socks or stockings every day. Make sure they are not too tight. Do not wear knee-high stockings since they may decrease blood flow to your legs. Wear shoes that fit properly and have enough cushioning. Always look in your shoes before you put them on to be sure there are no objects inside. To break in new shoes, wear them for just a few hours a day. This prevents injuries on your feet. Wounds, scrapes, corns, and calluses  Check your feet daily for blisters, cuts, bruises, sores, and redness. If you cannot see the bottom of your feet, use a mirror or ask someone for help. Do not cut corns or calluses or try to remove them with medicine. If you find a minor scrape,  cut, or break in the skin on your feet, keep it and the skin around it clean and dry. You may clean these areas with mild soap and water. Do not clean the area with peroxide, alcohol, or iodine. If you have a wound, scrape, corn, or callus on your foot, look at it several times a day to make sure it is healing and not infected. Check for: Redness, swelling, or pain. Fluid or blood. Warmth. Pus or a bad smell. General tips Do not cross your legs. This may decrease blood flow to your feet. Do not use heating pads or hot water bottles on your feet. They may burn your skin. If you have lost feeling in your feet or legs, you may not know this is happening until it is too late. Protect your feet from hot and cold by wearing shoes, such as at the beach or on hot pavement. Schedule a complete foot exam at least once a year (annually) or more often if you have foot problems. Report any cuts, sores, or bruises to your health care provider immediately. Where to find more information American Diabetes Association: www.diabetes.org Association of Diabetes Care & Education Specialists: www.diabeteseducator.org Contact a health care provider if: You have a medical condition that increases your risk of infection and you have any cuts, sores, or bruises on your feet. You have an injury that is not healing. You have redness on your legs or feet. You   feel burning or tingling in your legs or feet. You have pain or cramps in your legs and feet. Your legs or feet are numb. Your feet always feel cold. You have pain around any toenails. Get help right away if: You have a wound, scrape, corn, or callus on your foot and: You have pain, swelling, or redness that gets worse. You have fluid or blood coming from the wound, scrape, corn, or callus. Your wound, scrape, corn, or callus feels warm to the touch. You have pus or a bad smell coming from the wound, scrape, corn, or callus. You have a fever. You have a red  line going up your leg. Summary Check your feet every day for blisters, cuts, bruises, sores, and redness. Apply a moisturizing lotion or petroleum jelly to the skin on your feet and to dry, brittle toenails. Wear shoes that fit properly and have enough cushioning. If you have foot problems, report any cuts, sores, or bruises to your health care provider immediately. Schedule a complete foot exam at least once a year (annually) or more often if you have foot problems. This information is not intended to replace advice given to you by your health care provider. Make sure you discuss any questions you have with your health care provider. Document Revised: 03/02/2020 Document Reviewed: 03/02/2020 [employer]vier Patient Education  2023 [employer]  

## 2022-02-13 ENCOUNTER — Encounter: Payer: Self-pay | Admitting: Nurse Practitioner

## 2022-02-13 ENCOUNTER — Ambulatory Visit (INDEPENDENT_AMBULATORY_CARE_PROVIDER_SITE_OTHER): Payer: 59 | Admitting: Nurse Practitioner

## 2022-02-13 VITALS — BP 153/80 | HR 87 | Ht 63.0 in | Wt 244.0 lb

## 2022-02-13 DIAGNOSIS — Z794 Long term (current) use of insulin: Secondary | ICD-10-CM

## 2022-02-13 DIAGNOSIS — E1122 Type 2 diabetes mellitus with diabetic chronic kidney disease: Secondary | ICD-10-CM

## 2022-02-13 DIAGNOSIS — I10 Essential (primary) hypertension: Secondary | ICD-10-CM

## 2022-02-13 DIAGNOSIS — E782 Mixed hyperlipidemia: Secondary | ICD-10-CM

## 2022-02-13 DIAGNOSIS — N1832 Chronic kidney disease, stage 3b: Secondary | ICD-10-CM | POA: Diagnosis not present

## 2022-02-13 MED ORDER — TRESIBA FLEXTOUCH 100 UNIT/ML ~~LOC~~ SOPN: 30.0000 [IU] | PEN_INJECTOR | Freq: Every day | SUBCUTANEOUS | 3 refills | Status: DC

## 2022-02-13 NOTE — Progress Notes (Signed)
Endocrinology Follow Up Note       02/13/2022, 12:47 PM   Subjective:    Patient ID: Abigail Jackson, female    DOB: Nov 07, 1965.  Abigail Jackson is being seen in follow up after being seen in follow up after being seen in consultation for management of currently uncontrolled symptomatic diabetes requested by  [facility].   Past Medical History:  Diagnosis Date   Arthritis    Diabetes mellitus without complication ([place])    Heart murmur    Hyperlipidemia    Hypertension    Irregular bowel habits    Renal neoplasm    LEFT    Past Surgical History:  Procedure Laterality Date   ROBOTIC ASSITED PARTIAL NEPHRECTOMY Left 08/03/2015   Procedure: ROBOTIC ASSISTED PARTIAL LEFT NEPHRECTOMY;  Surgeon: Raynelle Bring, MD;  Location: [facility];  Service: Urology;  Laterality: Left;   SHOULDER SURGERY  2006   RIGHT    Social History   Socioeconomic History   Marital status: Married    Spouse name: Not on file   Number of children: Not on file   Years of education: Not on file   Highest education level: Not on file  Occupational History   Not on file  Tobacco Use   Smoking status: Never   Smokeless tobacco: Never  Vaping Use   Vaping Use: Never used  Substance and Sexual Activity   Alcohol use: No   Drug use: No   Sexual activity: Not on file  Other Topics Concern   Not on file  Social History Narrative   Not on file   Social Determinants of Health   Financial Resource Strain: Not on file  Food Insecurity: Not on file  Transportation Needs: Not on file  Physical Activity: Not on file  Stress: Not on file  Social Connections: Not on file    Family History  Problem Relation Age of Onset   Diabetes Mother    Hypertension Mother    Diabetes Father    Hypertension Father    Thyroid disease Father    Hyperlipidemia Father    Kidney disease Father    Heart failure Father      Outpatient Encounter Medications as of 02/13/2022  Medication Sig   acetaminophen (TYLENOL) 325 MG tablet Take by mouth.   amitriptyline (ELAVIL) 100 MG tablet Take 100 mg by mouth at bedtime.   aspirin 81 MG EC tablet 1 tablet   atorvastatin (LIPITOR) 20 MG tablet Take 20 mg by mouth daily.   cefdinir (OMNICEF) 300 MG capsule Take 300 mg by mouth 2 (two) times daily.   cholecalciferol (VITAMIN D) 400 units TABS tablet Take 400 Units by mouth.   diclofenac Sodium (VOLTAREN) 1 % GEL    dicyclomine (BENTYL) 10 MG capsule Take 1 capsule by mouth 2 (two) times daily.   Easy Touch Lancets 30G MISC USE AS DIRECTED TO TEST BLOOD GLUCOSE FOUR TIMES A DAY   fenofibrate (TRICOR) 145 MG tablet Take 145 mg by mouth daily.   gabapentin (NEURONTIN) 100 MG capsule Take 100 mg by mouth 2 (two) times daily.   glipiZIDE (GLUCOTROL) 5 MG tablet TAKE 1  TABLET BY MOUTH TWICE DAILY WITH A MEAL   glucose blood test strip 1 each by Other route 4 (four) times daily. Use as instructed 4 x daily. E11.65   hydrochlorothiazide (HYDRODIURIL) 25 MG tablet Take 25 mg by mouth daily.   HYDROcodone-acetaminophen (NORCO/VICODIN) 5-325 MG tablet Take 1 tablet by mouth every 6 (six) hours as needed for severe pain.   insulin degludec (TRESIBA FLEXTOUCH) 100 UNIT/ML FlexTouch Pen Inject 30 Units into the skin at bedtime.   Insulin Pen Needle (B-D ULTRAFINE III SHORT PEN) 31G X 8 MM MISC 1 each by Does not apply route as directed. Use with insulin pen qhs   LORazepam (ATIVAN) 1 MG tablet Take 1 the night before dental appointment. Take 1 one hour before dental appt. Take 1 before dental appt. Need driver.   meclizine (ANTIVERT) 25 MG tablet Take 1 tablet by mouth daily as needed.   methocarbamol (ROBAXIN) 500 MG tablet Take 1 tablet (500 mg total) by mouth 2 (two) times daily.   metoprolol succinate (TOPROL-XL) 50 MG 24 hr tablet Take 1 tablet (50 mg total) by mouth daily. Take with or immediately following a meal. (Patient  taking differently: Take 50 mg by mouth 2 (two) times daily. Take with or immediately following a meal.)   metoprolol tartrate (LOPRESSOR) 50 MG tablet Take 50 mg by mouth daily.   nystatin cream (MYCOSTATIN) Apply topically.   omeprazole (PRILOSEC) 40 MG capsule Take 1 capsule (40 mg total) by mouth at bedtime.   ondansetron (ZOFRAN) 4 MG tablet Take 4 mg by mouth every 8 (eight) hours as needed.   oxyCODONE (OXY IR/ROXICODONE) 5 MG immediate release tablet Take by mouth.   pregabalin (LYRICA) 50 MG capsule Take 50 mg by mouth 2 (two) times daily.   TRADJENTA 5 MG TABS tablet Take 5 mg by mouth daily.   [DISCONTINUED] blood glucose meter kit and supplies KIT Dispense based on patient and insurance preference. Use up to four times daily as directed. (FOR ICD-10 E11.65)   [DISCONTINUED] glucose blood (ONETOUCH ULTRA) test strip USE TO TEST BLOOD SUGAR FOUR TIMES A DAY AS DIRECTED   [DISCONTINUED] insulin degludec (TRESIBA FLEXTOUCH) 100 UNIT/ML FlexTouch Pen Inject 25 Units into the skin at bedtime.   [DISCONTINUED] lisinopril (ZESTRIL) 5 MG tablet Take 5 mg by mouth daily.   [DISCONTINUED] pregabalin (LYRICA) 100 MG capsule Take 100 mg by mouth 2 (two) times daily.   No facility-administered encounter medications on file as of 02/13/2022.    ALLERGIES: Allergies  Allergen Reactions   Tramadol Itching and Rash    VACCINATION STATUS:  There is no immunization history on file for this patient.  Diabetes She presents for her follow-up diabetic visit. She has type 2 diabetes mellitus. Onset time: Diagnosed at approx age of 56. Her disease course has been worsening. There are no hypoglycemic associated symptoms. Associated symptoms include fatigue. There are no hypoglycemic complications. Symptoms are stable. Diabetic complications include nephropathy. Risk factors for coronary artery disease include diabetes mellitus, dyslipidemia, family history, obesity, hypertension and sedentary lifestyle.  Current diabetic treatment includes oral agent (dual therapy) and insulin injections. She is compliant with treatment most of the time. Her weight is increasing steadily. She is following a generally unhealthy diet. When asked about meal planning, she reported none. She has not had a previous visit with a dietitian. She rarely participates in exercise. Her home blood glucose trend is increasing steadily. Her breakfast blood glucose range is generally >200 mg/dl. Her bedtime blood  glucose range is generally >200 mg/dl. Her overall blood glucose range is >200 mg/dl. (She presents today, accompanied by her daughter, with her meter and logs showing above target glycemic profile overall.  Her previsit A1c on 6/13 was 9.3%, worsening from last visit of 7.2%.  She reports she has slipped a bit with her diet choices.  She denies any hypoglycemia.) An ACE inhibitor/angiotensin II receptor blocker is being taken. She does not see a podiatrist.Eye exam is current.  Hyperlipidemia This is a chronic problem. The current episode started more than 1 year ago. The problem is uncontrolled. Recent lipid tests were reviewed and are high. Exacerbating diseases include chronic renal disease, diabetes and obesity. Factors aggravating her hyperlipidemia include beta blockers, thiazides and fatty foods. Current antihyperlipidemic treatment includes statins. The current treatment provides moderate improvement of lipids. Compliance problems include adherence to diet and adherence to exercise.  Risk factors for coronary artery disease include diabetes mellitus, dyslipidemia, family history, hypertension, obesity and a sedentary lifestyle.  Hypertension This is a chronic problem. The current episode started more than 1 year ago. The problem has been waxing and waning since onset. The problem is uncontrolled. There are no associated agents to hypertension. Risk factors for coronary artery disease include diabetes mellitus, dyslipidemia,  family history, obesity and sedentary lifestyle. Past treatments include beta blockers, diuretics and ACE inhibitors. The current treatment provides moderate improvement. There are no compliance problems.  Hypertensive end-organ damage includes kidney disease. with hx of left nephrectomy. Identifiable causes of hypertension include chronic renal disease and sleep apnea.     Review of systems  Constitutional: +steadily increasing body weight,  current Body mass index is 43.22 kg/m. , + fatigue, no subjective hyperthermia, no subjective hypothermia Eyes: no blurry vision, no xerophthalmia ENT: no sore throat, no nodules palpated in throat, no dysphagia/odynophagia, no hoarseness Cardiovascular: no chest pain, no shortness of breath, no palpitations, no leg swelling Respiratory: no cough, no shortness of breath Gastrointestinal: no nausea/vomiting/diarrhea Musculoskeletal: c/o intermittent back pain- walks with walker Skin: no rashes, no hyperemia Neurological: no tremors, no numbness, no tingling, no dizziness Psychiatric: no depression, no anxiety  Objective:     BP (!) 153/80   Pulse 87   Ht _0  (1.6 m)   Wt 244 lb (110.7 kg)   BMI 43.22 kg/m   Wt Readings from Last 3 Encounters:  02/13/22 244 lb (110.7 kg)  11/09/21 235 lb (106.6 kg)  10/12/21 235 lb 3.2 oz (106.7 kg)     BP Readings from Last 3 Encounters:  02/13/22 (!) 153/80  11/09/21 117/73  10/12/21 126/68     Physical Exam- Limited  Constitutional:  Body mass index is 43.22 kg/m., not in acute distress, normal state of mind Eyes:  EOMI, no exophthalmos Neck: Supple Cardiovascular: RRR, no murmurs, rubs, or gallops, no edema Respiratory: Adequate breathing efforts, no crackles, rales, rhonchi, or wheezing Musculoskeletal: no gross deformities, strength intact in all four extremities, no gross restriction of joint movements, walks with walker Skin:  no rashes, no hyperemia Neurological: no tremor with  outstretched hands    Diabetic Foot Exam - Simple   No data filed      CMP ( most recent) CMP     Component Value Date/Time   NA 140 02/05/2022 0000   K 4.6 02/05/2022 0000   CL 104 02/05/2022 0000   CO2 28 (A) 02/05/2022 0000   GLUCOSE 130 (H) 09/03/2021 0807   BUN 15 02/05/2022 0000   CREATININE 1.7 (  A) 02/05/2022 0000   CREATININE 1.44 (H) 09/03/2021 0807   CALCIUM 10.4 02/05/2022 0000   PROT 6.9 09/03/2021 0807   ALBUMIN 4.2 02/05/2022 0000   AST 23 02/05/2022 0000   ALT 23 02/05/2022 0000   ALKPHOS 43 02/05/2022 0000   BILITOT 0.4 09/03/2021 0807   GFRNONAA 41 (L) 04/13/2018 1113   GFRAA 39 12/26/2020 0000   GFRAA 47 (L) 04/13/2018 1113     Diabetic Labs (most recent): Lab Results  Component Value Date   HGBA1C 9.3 02/05/2022   HGBA1C 7.2 (A) 09/10/2021   HGBA1C 6.2 05/08/2021   MICROALBUR 0.5 02/05/2022   MICROALBUR 1.7 12/10/2017     Lipid Panel ( most recent) Lipid Panel     Component Value Date/Time   CHOL 216 (A) 02/05/2022 0000   TRIG 127 02/05/2022 0000   HDL 45 02/05/2022 0000   CHOLHDL 4.2 09/03/2021 0807   LDLCALC 146 02/05/2022 0000   LDLCALC 159 (H) 09/03/2021 0807      Lab Results  Component Value Date   TSH 1.41 02/05/2022   TSH 2.44 09/03/2021   TSH 1.96 08/30/2020   TSH 2.547 08/06/2015   FREET4 1.1 09/03/2021           Assessment & Plan:   1) Uncontrolled Type 2 Diabetes with kidney complications:  She presents today, accompanied by her daughter, with her meter and logs showing above target glycemic profile overall.  Her previsit A1c on 6/13 was 9.3%, worsening from last visit of 7.2%.  She reports she has slipped a bit with her diet choices.  She denies any hypoglycemia.  - JACEY ECKERSON has currently uncontrolled symptomatic type 2 DM since 56 years of age.   -Recent labs reviewed.  - I had a long discussion with her about the progressive nature of diabetes and the pathology behind its complications. -her  diabetes is complicated by CKD 3 and she remains at a high risk for more acute and chronic complications which include CAD, CVA, CKD, retinopathy, and neuropathy. These are all discussed in detail with her.  - Nutritional counseling repeated at each appointment due to patients tendency to fall back in to old habits.  - The patient admits there is a room for improvement in their diet and drink choices. -  Suggestion is made for the patient to avoid simple carbohydrates from their diet including Cakes, Sweet Desserts / Pastries, Ice Cream, Soda (diet and regular), Sweet Tea, Candies, Chips, Cookies, Sweet Pastries, Store Bought Juices, Alcohol in Excess of 1-2 drinks a day, Artificial Sweeteners, Coffee Creamer, and "Sugar-free" Products. This will help patient to have stable blood glucose profile and potentially avoid unintended weight gain.   - I encouraged the patient to switch to unprocessed or minimally processed complex starch and increased protein intake (animal or plant source), fruits, and vegetables.   - Patient is advised to stick to a routine mealtimes to eat 3 meals a day and avoid unnecessary snacks (to snack only to correct hypoglycemia).  - she will be scheduled with Jearld Fenton, RDN, CDE for diabetes education.  - I have approached her with the following individualized plan to manage her diabetes and patient agrees:   -Given her gross hyperglycemia, she is advised to increase her [place] to 30 units SQ nightly, continue Tradjenta 5 mg po daily, and continue Glipizide 5 mg po twice daily with meals.   -She is encouraged to continue monitoring blood glucose twice daily, before breakfast and before bed, and  to call the clinic if she has readings less than 70 or greater than 300 for 3 tests in a row.  She could benefit from CGM device.  I gave her sample Dexcom G7 and assisted her setting it up today.  I sent Rx to [facility] for fulfillment.  - she is warned not to take insulin  without proper monitoring per orders. - Adjustment parameters are given to her for hypo and hyperglycemia in writing.  - she is not a candidate for Metformin due to concurrent renal insufficiency.  - Specific targets for  A1c; LDL, HDL, and Triglycerides were discussed with the patient.  2) Blood Pressure /Hypertension:  her blood pressure is not controlled to target today.   she is advised to continue her current medications including HCTZ 25 mg po daily, Lisinopril 5 mg p.o. daily, and Metoprolol 50 mg po twice daily with breakfast.   3) Lipids/Hyperlipidemia:    Review of her recent lipid panel from 02/05/22 showed uncontrolled LDL at 146 .  she is advised to continue Lipitor 20 mg daily at bedtime and Tricor 145 mg po daily.  Side effects and precautions discussed with her.  4)  Weight/Diet:  her Body mass index is 43.22 kg/m.  -  clearly complicating her diabetes care.   she is a candidate for weight loss. I discussed with her the fact that loss of 5 - 10% of her  current body weight will have the most impact on her diabetes management.  Exercise, and detailed carbohydrates information provided  -  detailed on discharge instructions.  5) Chronic Care/Health Maintenance: -she is on ACEI/ARB and Statin medications and is encouraged to initiate and continue to follow up with Ophthalmology, Dentist, Podiatrist at least yearly or according to recommendations, and advised to stay away from smoking. I have recommended yearly flu vaccine and pneumonia vaccine at least every 5 years; moderate intensity exercise for up to 150 minutes weekly; and sleep for at least 7 hours a day.  - she is advised to maintain close follow up with [facility] for primary care needs, as well as her other providers for optimal and coordinated care.     I spent 43 minutes in the care of the patient today including review of labs from [place], Lipids, Thyroid Function, Hematology (current and  previous including abstractions from other facilities); face-to-face time discussing  her blood glucose readings/logs, discussing hypoglycemia and hyperglycemia episodes and symptoms, medications doses, her options of short and long term treatment based on the latest standards of care / guidelines;  discussion about incorporating lifestyle medicine;  and documenting the encounter.    Please refer to Patient Instructions for Blood Glucose Monitoring and Insulin/Medications Dosing Guide"  in media tab for additional information. Please  also refer to " Patient Self Inventory" in the Media  tab for reviewed elements of pertinent patient history.  Abigail Jackson participated in the discussions, expressed understanding, and voiced agreement with the above plans.  All questions were answered to her satisfaction. she is encouraged to contact clinic should she have any questions or concerns prior to her return visit.     Follow up plan: - Return in about 3 months (around 05/16/2022) for Diabetes F/U with A1c in office, No previsit labs, Bring meter and logs.    Rayetta Pigg, [facility] [address] [place], [place] [address] Phone: [phone] Fax: [phone]  02/13/2022, 12:47 PM

## 2022-03-11 ENCOUNTER — Telehealth: Payer: Self-pay | Admitting: Nurse Practitioner

## 2022-03-11 NOTE — Telephone Encounter (Signed)
Pt left a VM requesting a nurse call her in regards to her readings

## 2022-03-11 NOTE — Telephone Encounter (Signed)
Pt states her glucose readings are high throughout the night. Uploaded her data from her [place] placed it on your desk. Pt states she continues to take glipizide '5mg'$  bid, tresiba 30 units qhs and tradjenta '5mg'$  daily.

## 2022-03-12 NOTE — Telephone Encounter (Signed)
Have her increase her Tresiba to 40 units SQ nightly for now and see if it helps bring her to normal range.

## 2022-03-12 NOTE — Telephone Encounter (Signed)
Pt.notified

## 2022-03-21 ENCOUNTER — Other Ambulatory Visit: Payer: Self-pay | Admitting: Nurse Practitioner

## 2022-03-21 DIAGNOSIS — Z794 Long term (current) use of insulin: Secondary | ICD-10-CM

## 2022-04-02 ENCOUNTER — Other Ambulatory Visit: Payer: Self-pay | Admitting: Nurse Practitioner

## 2022-04-02 DIAGNOSIS — E1122 Type 2 diabetes mellitus with diabetic chronic kidney disease: Secondary | ICD-10-CM

## 2022-04-02 NOTE — Telephone Encounter (Signed)
Patient left a VM stating that she is taking 40 units and the rx that was sent in was for 30 units. Can you update this if accurate?

## 2022-05-07 ENCOUNTER — Telehealth: Payer: Self-pay | Admitting: Nurse Practitioner

## 2022-05-07 NOTE — Telephone Encounter (Signed)
New message    The patient is asking snack options that won't interfere with her blood sugar.

## 2022-05-08 NOTE — Telephone Encounter (Signed)
Patient was called and given Abigail Reardon,NP recommendation.

## 2022-05-16 ENCOUNTER — Encounter: Payer: Self-pay | Admitting: Nutrition

## 2022-05-16 ENCOUNTER — Encounter: Payer: 59 | Attending: Nurse Practitioner | Admitting: Nutrition

## 2022-05-16 ENCOUNTER — Encounter: Payer: Self-pay | Admitting: Nurse Practitioner

## 2022-05-16 ENCOUNTER — Ambulatory Visit (INDEPENDENT_AMBULATORY_CARE_PROVIDER_SITE_OTHER): Payer: 59 | Admitting: Nurse Practitioner

## 2022-05-16 VITALS — BP 145/80 | HR 76 | Ht 63.0 in | Wt 241.8 lb

## 2022-05-16 VITALS — Ht 63.0 in | Wt 241.0 lb

## 2022-05-16 DIAGNOSIS — Z794 Long term (current) use of insulin: Secondary | ICD-10-CM

## 2022-05-16 DIAGNOSIS — E782 Mixed hyperlipidemia: Secondary | ICD-10-CM

## 2022-05-16 DIAGNOSIS — N1832 Chronic kidney disease, stage 3b: Secondary | ICD-10-CM | POA: Diagnosis not present

## 2022-05-16 DIAGNOSIS — E1122 Type 2 diabetes mellitus with diabetic chronic kidney disease: Secondary | ICD-10-CM | POA: Insufficient documentation

## 2022-05-16 DIAGNOSIS — N183 Chronic kidney disease, stage 3 unspecified: Secondary | ICD-10-CM | POA: Diagnosis present

## 2022-05-16 DIAGNOSIS — I10 Essential (primary) hypertension: Secondary | ICD-10-CM | POA: Diagnosis present

## 2022-05-16 LAB — POCT GLYCOSYLATED HEMOGLOBIN (HGB A1C): Hemoglobin A1C: 7.5 % — AB (ref 4.0–5.6)

## 2022-05-16 MED ORDER — TRESIBA FLEXTOUCH 100 UNIT/ML ~~LOC~~ SOPN: 40.0000 [IU] | PEN_INJECTOR | Freq: Every day | SUBCUTANEOUS | 3 refills | Status: DC

## 2022-05-16 NOTE — Patient Instructions (Addendum)
Goals  Cut out spite and only drink water Have peas and green beans with  lunch and dinner Increase walking as much as you can in your house for weight loss. Get A1C down to 7% or below Lose 5 lbs til next visit

## 2022-05-16 NOTE — Progress Notes (Signed)
Endocrinology Follow Up Note       05/16/2022, 11:37 AM   Subjective:    Patient ID: Abigail Jackson, female    DOB: 1965-12-16.  Abigail Jackson is being seen in follow up after being seen in follow up after being seen in consultation for management of currently uncontrolled symptomatic diabetes requested by  [facility].   Past Medical History:  Diagnosis Date   Arthritis    Diabetes mellitus without complication ([place])    Heart murmur    Hyperlipidemia    Hypertension    Irregular bowel habits    Renal neoplasm    LEFT    Past Surgical History:  Procedure Laterality Date   ROBOTIC ASSITED PARTIAL NEPHRECTOMY Left 08/03/2015   Procedure: ROBOTIC ASSISTED PARTIAL LEFT NEPHRECTOMY;  Surgeon: Raynelle Bring, MD;  Location: [facility];  Service: Urology;  Laterality: Left;   SHOULDER SURGERY  2006   RIGHT    Social History   Socioeconomic History   Marital status: Married    Spouse name: Not on file   Number of children: Not on file   Years of education: Not on file   Highest education level: Not on file  Occupational History   Not on file  Tobacco Use   Smoking status: Never   Smokeless tobacco: Never  Vaping Use   Vaping Use: Never used  Substance and Sexual Activity   Alcohol use: No   Drug use: No   Sexual activity: Not on file  Other Topics Concern   Not on file  Social History Narrative   Not on file   Social Determinants of Health   Financial Resource Strain: Not on file  Food Insecurity: Not on file  Transportation Needs: Not on file  Physical Activity: Not on file  Stress: Not on file  Social Connections: Not on file    Family History  Problem Relation Age of Onset   Diabetes Mother    Hypertension Mother    Diabetes Father    Hypertension Father    Thyroid disease Father    Hyperlipidemia Father    Kidney disease Father    Heart failure Father      Outpatient Encounter Medications as of 05/16/2022  Medication Sig   acetaminophen (TYLENOL) 325 MG tablet Take by mouth.   amitriptyline (ELAVIL) 100 MG tablet Take 100 mg by mouth at bedtime.   aspirin 81 MG EC tablet 1 tablet   cholecalciferol (VITAMIN D) 400 units TABS tablet Take 400 Units by mouth.   dicyclomine (BENTYL) 10 MG capsule Take 1 capsule by mouth 2 (two) times daily.   Easy Touch Lancets 30G MISC USE AS DIRECTED TO TEST BLOOD GLUCOSE FOUR TIMES A DAY   fenofibrate (TRICOR) 145 MG tablet Take 145 mg by mouth daily.   gabapentin (NEURONTIN) 100 MG capsule Take 100 mg by mouth 2 (two) times daily.   glucose blood test strip 1 each by Other route 4 (four) times daily. Use as instructed 4 x daily. E11.65   Insulin Pen Needle (B-D ULTRAFINE III SHORT PEN) 31G X 8 MM MISC 1 each by Does not apply route as directed. Use  with insulin pen qhs   metoprolol succinate (TOPROL-XL) 50 MG 24 hr tablet Take 1 tablet (50 mg total) by mouth daily. Take with or immediately following a meal. (Patient taking differently: Take 50 mg by mouth 2 (two) times daily. Take with or immediately following a meal.)   metoprolol tartrate (LOPRESSOR) 50 MG tablet Take 50 mg by mouth daily.   omeprazole (PRILOSEC) 40 MG capsule Take 1 capsule (40 mg total) by mouth at bedtime.   pregabalin (LYRICA) 50 MG capsule Take 50 mg by mouth 2 (two) times daily.   TRADJENTA 5 MG TABS tablet Take 5 mg by mouth daily.   [DISCONTINUED] glipiZIDE (GLUCOTROL) 5 MG tablet TAKE 1 TABLET BY MOUTH TWICE DAILY WITH A MEAL   [DISCONTINUED] insulin degludec (TRESIBA FLEXTOUCH) 100 UNIT/ML FlexTouch Pen Inject 40 Units into the skin at bedtime.   atorvastatin (LIPITOR) 20 MG tablet Take 20 mg by mouth daily. (Patient not taking: Reported on 05/16/2022)   cefdinir (OMNICEF) 300 MG capsule Take 300 mg by mouth 2 (two) times daily. (Patient not taking: Reported on 05/16/2022)   diclofenac Sodium (VOLTAREN) 1 % GEL  (Patient not taking:  Reported on 05/16/2022)   hydrochlorothiazide (HYDRODIURIL) 25 MG tablet Take 25 mg by mouth daily. (Patient not taking: Reported on 05/16/2022)   HYDROcodone-acetaminophen (NORCO/VICODIN) 5-325 MG tablet Take 1 tablet by mouth every 6 (six) hours as needed for severe pain. (Patient not taking: Reported on 05/16/2022)   insulin degludec (TRESIBA FLEXTOUCH) 100 UNIT/ML FlexTouch Pen Inject 40 Units into the skin at bedtime.   LORazepam (ATIVAN) 1 MG tablet Take 1 the night before dental appointment. Take 1 one hour before dental appt. Take 1 before dental appt. Need driver. (Patient not taking: Reported on 05/16/2022)   meclizine (ANTIVERT) 25 MG tablet Take 1 tablet by mouth daily as needed. (Patient not taking: Reported on 05/16/2022)   methocarbamol (ROBAXIN) 500 MG tablet Take 1 tablet (500 mg total) by mouth 2 (two) times daily. (Patient not taking: Reported on 05/16/2022)   nystatin cream (MYCOSTATIN) Apply topically. (Patient not taking: Reported on 05/16/2022)   ondansetron (ZOFRAN) 4 MG tablet Take 4 mg by mouth every 8 (eight) hours as needed. (Patient not taking: Reported on 05/16/2022)   oxyCODONE (OXY IR/ROXICODONE) 5 MG immediate release tablet Take by mouth. (Patient not taking: Reported on 05/16/2022)   No facility-administered encounter medications on file as of 05/16/2022.    ALLERGIES: Allergies  Allergen Reactions   Tramadol Itching and Rash    VACCINATION STATUS:  There is no immunization history on file for this patient.  Diabetes She presents for her follow-up diabetic visit. She has type 2 diabetes mellitus. Onset time: Diagnosed at approx age of 31. Her disease course has been improving. There are no hypoglycemic associated symptoms. Associated symptoms include fatigue. There are no hypoglycemic complications. Symptoms are stable. Diabetic complications include nephropathy. Risk factors for coronary artery disease include diabetes mellitus, dyslipidemia, family history,  obesity, hypertension and sedentary lifestyle. Current diabetic treatment includes oral agent (dual therapy) and insulin injections. She is compliant with treatment most of the time. Her weight is decreasing steadily. She is following a generally healthy diet. When asked about meal planning, she reported none. She has not had a previous visit with a dietitian. She rarely participates in exercise. Her home blood glucose trend is decreasing steadily. Her overall blood glucose range is 140-180 mg/dl. (She presents today with her CGM showing improved glycemic profile overall.  Her POCT A1c today  is 7.5%, improving from last visit of 9.3%.  Analysis of her CGM shows TIR 68%, TAR 32%, TBR 0% with a GMI of 7.1%.  She denies any significant hypoglycemia but does note she catches it before it gets too low.  She finds herself needing to snack more often to avoid those lows.) An ACE inhibitor/angiotensin II receptor blocker is being taken. She does not see a podiatrist.Eye exam is current.  Hyperlipidemia This is a chronic problem. The current episode started more than 1 year ago. The problem is uncontrolled. Recent lipid tests were reviewed and are high. Exacerbating diseases include chronic renal disease, diabetes and obesity. Factors aggravating her hyperlipidemia include beta blockers, thiazides and fatty foods. Current antihyperlipidemic treatment includes statins. The current treatment provides moderate improvement of lipids. Compliance problems include adherence to diet and adherence to exercise.  Risk factors for coronary artery disease include diabetes mellitus, dyslipidemia, family history, hypertension, obesity and a sedentary lifestyle.  Hypertension This is a chronic problem. The current episode started more than 1 year ago. The problem has been waxing and waning since onset. The problem is uncontrolled. There are no associated agents to hypertension. Risk factors for coronary artery disease include diabetes  mellitus, dyslipidemia, family history, obesity and sedentary lifestyle. Past treatments include beta blockers, diuretics and ACE inhibitors. The current treatment provides moderate improvement. There are no compliance problems.  Hypertensive end-organ damage includes kidney disease. with hx of left nephrectomy. Identifiable causes of hypertension include chronic renal disease and sleep apnea.     Review of systems  Constitutional: +stable body weight,  current Body mass index is 42.83 kg/m. , + fatigue, no subjective hyperthermia, no subjective hypothermia Eyes: no blurry vision, no xerophthalmia ENT: no sore throat, no nodules palpated in throat, no dysphagia/odynophagia, no hoarseness Cardiovascular: no chest pain, no shortness of breath, no palpitations, no leg swelling Respiratory: no cough, no shortness of breath Gastrointestinal: no nausea/vomiting/diarrhea Musculoskeletal: c/o intermittent back pain- walks with walker Skin: no rashes, no hyperemia Neurological: no tremors, no numbness, no tingling, no dizziness Psychiatric: no depression, no anxiety  Objective:     BP (!) 145/80 (BP Location: Left Arm, Patient Position: Sitting, Cuff Size: Large)   Pulse 76   Ht '5\' 3"'$  (1.6 m)   Wt 241 lb 12.8 oz (109.7 kg)   BMI 42.83 kg/m   Wt Readings from Last 3 Encounters:  05/16/22 241 lb 12.8 oz (109.7 kg)  02/13/22 244 lb (110.7 kg)  11/09/21 235 lb (106.6 kg)     BP Readings from Last 3 Encounters:  05/16/22 (!) 145/80  02/13/22 (!) 153/80  11/09/21 117/73     Physical Exam- Limited  Constitutional:  Body mass index is 42.83 kg/m., not in acute distress, normal state of mind Eyes:  EOMI, no exophthalmos Neck: Supple Cardiovascular: RRR, no murmurs, rubs, or gallops, no edema Respiratory: Adequate breathing efforts, no crackles, rales, rhonchi, or wheezing Musculoskeletal: no gross deformities, strength intact in all four extremities, no gross restriction of joint  movements, walks with walker Skin:  no rashes, no hyperemia Neurological: no tremor with outstretched hands    Diabetic Foot Exam - Simple   Simple Foot Form Diabetic Foot exam was performed with the following findings: Yes 05/16/2022 11:36 AM  Visual Inspection No deformities, no ulcerations, no other skin breakdown bilaterally: Yes Sensation Testing Intact to touch and monofilament testing bilaterally: Yes Pulse Check Posterior Tibialis and Dorsalis pulse intact bilaterally: Yes Comments      CMP ( most  recent) CMP     Component Value Date/Time   NA 140 02/05/2022 0000   K 4.6 02/05/2022 0000   CL 104 02/05/2022 0000   CO2 28 (A) 02/05/2022 0000   GLUCOSE 130 (H) 09/03/2021 0807   BUN 15 02/05/2022 0000   CREATININE 1.7 (A) 02/05/2022 0000   CREATININE 1.44 (H) 09/03/2021 0807   CALCIUM 10.4 02/05/2022 0000   PROT 6.9 09/03/2021 0807   ALBUMIN 4.2 02/05/2022 0000   AST 23 02/05/2022 0000   ALT 23 02/05/2022 0000   ALKPHOS 43 02/05/2022 0000   BILITOT 0.4 09/03/2021 0807   GFRNONAA 41 (L) 04/13/2018 1113   GFRAA 39 12/26/2020 0000   GFRAA 47 (L) 04/13/2018 1113     Diabetic Labs (most recent): Lab Results  Component Value Date   HGBA1C 7.5 (A) 05/16/2022   HGBA1C 9.3 02/05/2022   HGBA1C 7.2 (A) 09/10/2021   MICROALBUR 0.5 02/05/2022   MICROALBUR 1.7 12/10/2017     Lipid Panel ( most recent) Lipid Panel     Component Value Date/Time   CHOL 216 (A) 02/05/2022 0000   TRIG 127 02/05/2022 0000   HDL 45 02/05/2022 0000   CHOLHDL 4.2 09/03/2021 0807   LDLCALC 146 02/05/2022 0000   LDLCALC 159 (H) 09/03/2021 0807      Lab Results  Component Value Date   TSH 1.41 02/05/2022   TSH 2.44 09/03/2021   TSH 1.96 08/30/2020   TSH 2.547 08/06/2015   FREET4 1.1 09/03/2021           Assessment & Plan:   1) Uncontrolled Type 2 Diabetes with kidney complications:  She presents today with her CGM showing improved glycemic profile overall.  Her POCT A1c  today is 7.5%, improving from last visit of 9.3%.  Analysis of her CGM shows TIR 68%, TAR 32%, TBR 0% with a GMI of 7.1%.  She denies any significant hypoglycemia but does note she catches it before it gets too low.  She finds herself needing to snack more often to avoid those lows.  - Abigail Jackson has currently uncontrolled symptomatic type 2 DM since 56 years of age.   -Recent labs reviewed.  - I had a long discussion with her about the progressive nature of diabetes and the pathology behind its complications. -her diabetes is complicated by CKD 3 and she remains at a high risk for more acute and chronic complications which include CAD, CVA, CKD, retinopathy, and neuropathy. These are all discussed in detail with her.  - Nutritional counseling repeated at each appointment due to patients tendency to fall back in to old habits.  - The patient admits there is a room for improvement in their diet and drink choices. -  Suggestion is made for the patient to avoid simple carbohydrates from their diet including Cakes, Sweet Desserts / Pastries, Ice Cream, Soda (diet and regular), Sweet Tea, Candies, Chips, Cookies, Sweet Pastries, Store Bought Juices, Alcohol in Excess of 1-2 drinks a day, Artificial Sweeteners, Coffee Creamer, and "Sugar-free" Products. This will help patient to have stable blood glucose profile and potentially avoid unintended weight gain.   - I encouraged the patient to switch to unprocessed or minimally processed complex starch and increased protein intake (animal or plant source), fruits, and vegetables.   - Patient is advised to stick to a routine mealtimes to eat 3 meals a day and avoid unnecessary snacks (to snack only to correct hypoglycemia).  - she will be scheduled with Jearld Fenton, RDN,  CDE for diabetes education.  - I have approached her with the following individualized plan to manage her diabetes and patient agrees:   -Given her drops in glucose will stop her  Glipizide for now.  She can continue her Tresiba 40 units SQ nightly and Tradjenta 5 mg po daily.    -She is encouraged to continue monitoring blood glucose twice daily (using her CGM), before breakfast and before bed, and to call the clinic if she has readings less than 70 or greater than 300 for 3 tests in a row.  She is benefiting from CGM device (from [employer]).  Will recommend she stay on this device.  - she is warned not to take insulin without proper monitoring per orders. - Adjustment parameters are given to her for hypo and hyperglycemia in writing.  - she is not a candidate for Metformin due to concurrent renal insufficiency.  - Specific targets for  A1c; LDL, HDL, and Triglycerides were discussed with the patient.  2) Blood Pressure /Hypertension:  her blood pressure is not controlled to target today.   she is advised to continue her current medications including HCTZ 25 mg po daily, Lisinopril 5 mg p.o. daily, and Metoprolol 50 mg po twice daily with breakfast.   3) Lipids/Hyperlipidemia:    Review of her recent lipid panel from 02/05/22 showed uncontrolled LDL at 146 .  she is advised to continue Lipitor 20 mg daily at bedtime and Tricor 145 mg po daily.  Side effects and precautions discussed with her.  4)  Weight/Diet:  her Body mass index is 42.83 kg/m.  -  clearly complicating her diabetes care.   she is a candidate for weight loss. I discussed with her the fact that loss of 5 - 10% of her  current body weight will have the most impact on her diabetes management.  Exercise, and detailed carbohydrates information provided  -  detailed on discharge instructions.  5) Chronic Care/Health Maintenance: -she is on ACEI/ARB and Statin medications and is encouraged to initiate and continue to follow up with Ophthalmology, Dentist, Podiatrist at least yearly or according to recommendations, and advised to stay away from smoking. I have recommended yearly flu vaccine and pneumonia vaccine  at least every 5 years; moderate intensity exercise for up to 150 minutes weekly; and sleep for at least 7 hours a day.  - she is advised to maintain close follow up with [facility] for primary care needs, as well as her other providers for optimal and coordinated care.     I spent 38 minutes in the care of the patient today including review of labs from [place], Lipids, Thyroid Function, Hematology (current and previous including abstractions from other facilities); face-to-face time discussing  her blood glucose readings/logs, discussing hypoglycemia and hyperglycemia episodes and symptoms, medications doses, her options of short and long term treatment based on the latest standards of care / guidelines;  discussion about incorporating lifestyle medicine;  and documenting the encounter. Risk reduction counseling performed per USPSTF guidelines to reduce obesity and cardiovascular risk factors.     Please refer to Patient Instructions for Blood Glucose Monitoring and Insulin/Medications Dosing Guide"  in media tab for additional information. Please  also refer to " Patient Self Inventory" in the Media  tab for reviewed elements of pertinent patient history.  Abigail Jackson participated in the discussions, expressed understanding, and voiced agreement with the above plans.  All questions were answered to her satisfaction. she is  encouraged to contact clinic should she have any questions or concerns prior to her return visit.     Follow up plan: - Return in about 3 months (around 08/15/2022) for Diabetes F/U with A1c in office, No previsit labs, Bring meter and logs.    Rayetta Pigg, [facility] [address] [place], [place] [address] Phone: [phone] Fax: [phone]  05/16/2022, 11:37 AM

## 2022-05-16 NOTE — Progress Notes (Signed)
Medical Nutrition Therapy  Diabetes Follow up   Begin 1140  End1200  Primary concerns today: Diabetes Type 2, Obesity  Referral diagnosis: E11.8, E66.9 Preferred learning style: no preference indicated Learning readiness: change in progress   NUTRITION ASSESSMENT   Changes made:Saw Whitney today A1C down to 7.5% down from 9.3%. Lost 3 lbs.  Cut out BBQ potato chips. Drinking water now. Cut out juices and sodas. Trying to avoid junk food. Walking in her house more. Diet recall reveals more consistent meals. Using CGM and this has helped her see her BS readings related to what she is eating. She has cut down on eating junk food. Her daughter helps her a lot too. Still has back issues that limits her activity.   Tresiba 40 units a day. Tradjenta 5 mg daily. Stopped Glipizide as of today per endocrinology.   Anthropometrics  Wt Readings from Last 3 Encounters:  05/16/22 241 lb 12.8 oz (109.7 kg)  02/13/22 244 lb (110.7 kg)  11/09/21 235 lb (106.6 kg)   Ht Readings from Last 3 Encounters:  05/16/22 '5\' 3"'$  (1.6 m)  02/13/22 '5\' 3"'$  (1.6 m)  11/09/21 '5\' 3"'$  (1.6 m)   There is no height or weight on file to calculate BMI. '@BMIFA'$ @ Facility age limit for growth %iles is 20 years. Facility age limit for growth %iles is 20 years.    Clinical Medical Hx: Kidney cancer- 2018; Still has 1/2  of her left kidney and has her whole kidney on her left. Medications: Tresiba 25, Trajenta and Glipizide 5 mg BID. Labs:  Lab Results  Component Value Date   HGBA1C 7.5 (A) 05/16/2022      Latest Ref Rng & Units 02/05/2022   12:00 AM 09/03/2021    8:07 AM 12/26/2020   12:00 AM  CMP  Glucose 65 - 99 mg/dL  130    BUN 4 - '21 15  17  15      '$ Creatinine 0.5 - 1.1 1.7  1.44  1.6      Sodium 137 - 147 140  142    Potassium 3.5 - 5.1 mEq/L 4.6  4.3    Chloride 99 - 108 104  106    CO2 13 - '22 28  28    '$ Calcium 8.7 - 10.7 10.4  10.3    Total Protein 6.1 - 8.1 g/dL  6.9    Total Bilirubin 0.2 - 1.2  mg/dL  0.4    Alkaline Phos 25 - 125 43     AST 13 - 35 23  22    ALT 7 - 35 U/L 23  19       This result is from an external source.   Lab Results  Component Value Date   HGBA1C 7.5 (A) 05/16/2022    Notable Signs/Symptoms: Increased thirsty, no appetite, fatigue, blurry vision, frequent urination  Lifestyle & Dietary Hx Has a bulging disc in her back. Getting PT right now. Doesn't know if she'll need surgery. Lives with her daughter, who is her caretaker. Has pancreatitis in May 2022 twice.    Estimated daily fluid intake: 24 oz Supplements: none Sleep: sleeps a lot Stress / self-care: her back problems, health issues Current average weekly physical activity: N/A  24-Hr Dietary Recall First Meal: bacon, egg and cheese biscuits Snack:  Second Meal: chicken, water  Third Meal: chicken sandwich,  Beverages: water   Estimated Energy Needs Calories: 1200 Carbohydrate: 135g Protein: 60g Fat: 40g   NUTRITION DIAGNOSIS  NB-1.1  Food and nutrition-related knowledge deficit As related to Diabetes Type 2.  As evidenced by A1C 7,2%, down from 11%.   NUTRITION INTERVENTION  Nutrition education (E-1) on the following topics:  Nutrition and Diabetes education provided on My Plate, CHO counting, meal planning, portion sizes, timing of meals, avoiding snacks between meals unless having a low blood sugar, target ranges for A1C and blood sugars, signs/symptoms and treatment of hyper/hypoglycemia, monitoring blood sugars, taking medications as prescribed, benefits of exercising 30 minutes per day and prevention of complications of DM.   Handouts Provided Include  My Plate Meal Plan Card   Learning Style & Readiness for Change Teaching method utilized: Visual & Auditory  Demonstrated degree of understanding via: Teach Back  Barriers to learning/adherence to lifestyle change: Mobility  Goals Established by Pt Goals  Cut out spite and only drink water Have peas and green  beans with  lunch and dinner Increase walking as much as you can in your house for weight loss. Get A1C down to 7% or below Lose 5 lbs til next visit  MONITORING & EVALUATION Dietary intake, weekly physical activity, and blood sugars in 3 month.  Next Steps  Patient is to work on meal planning and eating small frequent meals.

## 2022-05-29 ENCOUNTER — Telehealth: Payer: Self-pay | Admitting: Nurse Practitioner

## 2022-05-29 NOTE — Telephone Encounter (Signed)
New message    Patient calling about regarding her medication & omnipod   Dexcom is doing great

## 2022-05-29 NOTE — Telephone Encounter (Signed)
Per Whitney Reardon's verbal order - Tradjenta 5 mg - Take one by mouth daily #90 with 1 refill was called to [facility]. Patient was called and made aware.

## 2022-05-29 NOTE — Telephone Encounter (Signed)
I am happy to hear she likes the [facility]!  I usually use insulin pumps for those people who are on multiple injections of insulin a day.  She is only on 1 and is managing pretty well.  I would like to try without it as it can be expensive.  Yes, she should be taking the Tradjenta 5 mg daily.

## 2022-05-29 NOTE — Telephone Encounter (Signed)
Talked with the patient. She states that she had seen on the TV, the commercial about Omnipod. She has an interest to get one of these. She would like your thought on it. Also, she states that at her last office visit it was on her paper work to start Tradjenta '5mg'$  take 1 day. She ask if she was to be taking this since she is not currently. Lastly, the Dexcom is working great.

## 2022-06-07 MED ORDER — TRADJENTA 5 MG PO TABS: 5.0000 mg | ORAL_TABLET | Freq: Every day | ORAL | 1 refills | Status: DC

## 2022-06-07 NOTE — Telephone Encounter (Signed)
done

## 2022-06-07 NOTE — Telephone Encounter (Signed)
Tradjenta was not called in, pt is asking could [facility] thank you

## 2022-08-15 ENCOUNTER — Ambulatory Visit: Payer: 59 | Admitting: Nutrition

## 2022-08-15 ENCOUNTER — Encounter: Payer: Self-pay | Admitting: Nurse Practitioner

## 2022-08-15 ENCOUNTER — Ambulatory Visit (INDEPENDENT_AMBULATORY_CARE_PROVIDER_SITE_OTHER): Payer: 59 | Admitting: Nurse Practitioner

## 2022-08-15 VITALS — BP 121/72 | Ht 63.0 in | Wt 247.2 lb

## 2022-08-15 DIAGNOSIS — E1122 Type 2 diabetes mellitus with diabetic chronic kidney disease: Secondary | ICD-10-CM

## 2022-08-15 DIAGNOSIS — I10 Essential (primary) hypertension: Secondary | ICD-10-CM | POA: Diagnosis not present

## 2022-08-15 DIAGNOSIS — N1832 Chronic kidney disease, stage 3b: Secondary | ICD-10-CM | POA: Diagnosis not present

## 2022-08-15 DIAGNOSIS — E782 Mixed hyperlipidemia: Secondary | ICD-10-CM

## 2022-08-15 DIAGNOSIS — Z794 Long term (current) use of insulin: Secondary | ICD-10-CM | POA: Diagnosis not present

## 2022-08-15 MED ORDER — TRADJENTA 5 MG PO TABS: 5.0000 mg | ORAL_TABLET | Freq: Every day | ORAL | 3 refills | Status: DC

## 2022-08-15 MED ORDER — TRESIBA FLEXTOUCH 100 UNIT/ML ~~LOC~~ SOPN: 50.0000 [IU] | PEN_INJECTOR | Freq: Every day | SUBCUTANEOUS | 3 refills | Status: DC

## 2022-08-15 NOTE — Progress Notes (Signed)
Endocrinology Follow Up Note       08/15/2022, 10:00 AM   Subjective:    Patient ID: Abigail Jackson, female    DOB: 1965/12/26.  Abigail Jackson is being seen in follow up after being seen in follow up after being seen in consultation for management of currently uncontrolled symptomatic diabetes requested by  [facility].   Past Medical History:  Diagnosis Date   Arthritis    Diabetes mellitus without complication ([place])    Heart murmur    Hyperlipidemia    Hypertension    Irregular bowel habits    Renal neoplasm    LEFT    Past Surgical History:  Procedure Laterality Date   ROBOTIC ASSITED PARTIAL NEPHRECTOMY Left 08/03/2015   Procedure: ROBOTIC ASSISTED PARTIAL LEFT NEPHRECTOMY;  Surgeon: Raynelle Bring, MD;  Location: [facility];  Service: Urology;  Laterality: Left;   SHOULDER SURGERY  2006   RIGHT    Social History   Socioeconomic History   Marital status: Married    Spouse name: Not on file   Number of children: Not on file   Years of education: Not on file   Highest education level: Not on file  Occupational History   Not on file  Tobacco Use   Smoking status: Never   Smokeless tobacco: Never  Vaping Use   Vaping Use: Never used  Substance and Sexual Activity   Alcohol use: No   Drug use: No   Sexual activity: Not on file  Other Topics Concern   Not on file  Social History Narrative   Not on file   Social Determinants of Health   Financial Resource Strain: Not on file  Food Insecurity: Not on file  Transportation Needs: Not on file  Physical Activity: Not on file  Stress: Not on file  Social Connections: Not on file    Family History  Problem Relation Age of Onset   Diabetes Mother    Hypertension Mother    Diabetes Father    Hypertension Father    Thyroid disease Father    Hyperlipidemia Father    Kidney disease Father    Heart failure  Father     Outpatient Encounter Medications as of 08/15/2022  Medication Sig   acetaminophen (TYLENOL) 325 MG tablet Take by mouth.   amitriptyline (ELAVIL) 100 MG tablet Take 100 mg by mouth at bedtime.   amLODipine (NORVASC) 5 MG tablet Take 1 tablet by mouth daily.   aspirin 81 MG EC tablet 1 tablet   cholecalciferol (VITAMIN D) 400 units TABS tablet Take 400 Units by mouth.   cyclobenzaprine (FLEXERIL) 10 MG tablet Take 10 mg by mouth 3 (three) times daily.   dicyclomine (BENTYL) 10 MG capsule Take 1 capsule by mouth 2 (two) times daily.   Easy Touch Lancets 30G MISC USE AS DIRECTED TO TEST BLOOD GLUCOSE FOUR TIMES A DAY   ezetimibe (ZETIA) 10 MG tablet Take 1 tablet by mouth daily.   fenofibrate (TRICOR) 145 MG tablet Take 145 mg by mouth daily.   gabapentin (NEURONTIN) 100 MG capsule Take 100 mg by mouth 2 (two) times daily.   glucose blood test  strip 1 each by Other route 4 (four) times daily. Use as instructed 4 x daily. E11.65   Insulin Pen Needle (B-D ULTRAFINE III SHORT PEN) 31G X 8 MM MISC 1 each by Does not apply route as directed. Use with insulin pen qhs   LORazepam (ATIVAN) 1 MG tablet    metoprolol succinate (TOPROL-XL) 50 MG 24 hr tablet Take 1 tablet (50 mg total) by mouth daily. Take with or immediately following a meal. (Patient taking differently: Take 50 mg by mouth 2 (two) times daily. Take with or immediately following a meal.)   metoprolol tartrate (LOPRESSOR) 50 MG tablet Take 50 mg by mouth daily.   omeprazole (PRILOSEC) 40 MG capsule Take 1 capsule (40 mg total) by mouth at bedtime.   oxyCODONE (OXY IR/ROXICODONE) 5 MG immediate release tablet Take by mouth.   pregabalin (LYRICA) 50 MG capsule Take 50 mg by mouth 2 (two) times daily.   [DISCONTINUED] insulin degludec (TRESIBA FLEXTOUCH) 100 UNIT/ML FlexTouch Pen Inject 40 Units into the skin at bedtime.   [DISCONTINUED] TRADJENTA 5 MG TABS tablet Take 1 tablet (5 mg total) by mouth daily.   insulin degludec  (TRESIBA FLEXTOUCH) 100 UNIT/ML FlexTouch Pen Inject 50 Units into the skin at bedtime.   TRADJENTA 5 MG TABS tablet Take 1 tablet (5 mg total) by mouth daily.   [DISCONTINUED] atorvastatin (LIPITOR) 20 MG tablet Take 20 mg by mouth daily. (Patient not taking: Reported on 05/16/2022)   [DISCONTINUED] cefdinir (OMNICEF) 300 MG capsule Take 300 mg by mouth 2 (two) times daily. (Patient not taking: Reported on 05/16/2022)   [DISCONTINUED] diclofenac Sodium (VOLTAREN) 1 % GEL  (Patient not taking: Reported on 05/16/2022)   [DISCONTINUED] hydrochlorothiazide (HYDRODIURIL) 25 MG tablet Take 25 mg by mouth daily. (Patient not taking: Reported on 05/16/2022)   [DISCONTINUED] HYDROcodone-acetaminophen (NORCO/VICODIN) 5-325 MG tablet Take 1 tablet by mouth every 6 (six) hours as needed for severe pain. (Patient not taking: Reported on 05/16/2022)   [DISCONTINUED] meclizine (ANTIVERT) 25 MG tablet Take 1 tablet by mouth daily as needed. (Patient not taking: Reported on 05/16/2022)   [DISCONTINUED] methocarbamol (ROBAXIN) 500 MG tablet Take 1 tablet (500 mg total) by mouth 2 (two) times daily. (Patient not taking: Reported on 05/16/2022)   [DISCONTINUED] nystatin cream (MYCOSTATIN) Apply topically. (Patient not taking: Reported on 05/16/2022)   [DISCONTINUED] ondansetron (ZOFRAN) 4 MG tablet Take 4 mg by mouth every 8 (eight) hours as needed. (Patient not taking: Reported on 05/16/2022)   No facility-administered encounter medications on file as of 08/15/2022.    ALLERGIES: Allergies  Allergen Reactions   Tramadol Itching and Rash    VACCINATION STATUS:  There is no immunization history on file for this patient.  Diabetes She presents for her follow-up diabetic visit. She has type 2 diabetes mellitus. Onset time: Diagnosed at approx age of 55. Her disease course has been stable. There are no hypoglycemic associated symptoms. Associated symptoms include fatigue. There are no hypoglycemic complications. Symptoms  are stable. Diabetic complications include nephropathy. Risk factors for coronary artery disease include diabetes mellitus, dyslipidemia, family history, obesity, hypertension and sedentary lifestyle. Current diabetic treatment includes insulin injections and oral agent (monotherapy). She is compliant with treatment most of the time. Her weight is increasing steadily. She is following a generally healthy diet. When asked about meal planning, she reported none. She has not had a previous visit with a dietitian. She rarely participates in exercise. Her home blood glucose trend is fluctuating minimally. Her overall blood glucose  range is 180-200 mg/dl. (She presents today with her CGM showing slightly above target glycemic profile overall.  Her recent A1c checked by her PCP was 8.2% on 08/08/22, increasing from last visit of 7.5%.  Analysis of her CGM shows TIR 37%, TAR 63%, TBR 0% with a GMI of 8.1%.  She denies any hypoglycemia.  She knows her weakness is bread and the occasional Kool-aid.) An ACE inhibitor/angiotensin II receptor blocker is being taken. She does not see a podiatrist.Eye exam is current.  Hyperlipidemia This is a chronic problem. The current episode started more than 1 year ago. The problem is uncontrolled. Recent lipid tests were reviewed and are high. Exacerbating diseases include chronic renal disease, diabetes and obesity. Factors aggravating her hyperlipidemia include beta blockers, thiazides and fatty foods. Current antihyperlipidemic treatment includes statins. The current treatment provides moderate improvement of lipids. Compliance problems include adherence to diet and adherence to exercise.  Risk factors for coronary artery disease include diabetes mellitus, dyslipidemia, family history, hypertension, obesity and a sedentary lifestyle.  Hypertension This is a chronic problem. The current episode started more than 1 year ago. The problem has been waxing and waning since onset. The  problem is uncontrolled. There are no associated agents to hypertension. Risk factors for coronary artery disease include diabetes mellitus, dyslipidemia, family history, obesity and sedentary lifestyle. Past treatments include beta blockers, diuretics and ACE inhibitors. The current treatment provides moderate improvement. There are no compliance problems.  Hypertensive end-organ damage includes kidney disease. with hx of left nephrectomy. Identifiable causes of hypertension include chronic renal disease and sleep apnea.     Review of systems  Constitutional: +increasing body weight,  current Body mass index is 43.79 kg/m. , + fatigue, no subjective hyperthermia, no subjective hypothermia Eyes: no blurry vision, no xerophthalmia ENT: no sore throat, no nodules palpated in throat, no dysphagia/odynophagia, no hoarseness Cardiovascular: no chest pain, no shortness of breath, no palpitations, no leg swelling Respiratory: no cough, no shortness of breath Gastrointestinal: no nausea/vomiting/diarrhea Musculoskeletal: c/o intermittent back pain- walks with walker Skin: no rashes, no hyperemia Neurological: no tremors, no numbness, no tingling, no dizziness Psychiatric: no depression, no anxiety  Objective:     BP 121/72 (BP Location: Right Arm, Patient Position: Sitting, Cuff Size: Large)   Ht '5\' 3"'$  (1.6 m)   Wt 247 lb 3.2 oz (112.1 kg)   BMI 43.79 kg/m   Wt Readings from Last 3 Encounters:  08/15/22 247 lb 3.2 oz (112.1 kg)  05/16/22 241 lb (109.3 kg)  05/16/22 241 lb 12.8 oz (109.7 kg)     BP Readings from Last 3 Encounters:  08/15/22 121/72  05/16/22 (!) 145/80  02/13/22 (!) 153/80     Physical Exam- Limited  Constitutional:  Body mass index is 43.79 kg/m., not in acute distress, normal state of mind Eyes:  EOMI, no exophthalmos Musculoskeletal: no gross deformities, strength intact in all four extremities, no gross restriction of joint movements, walks with walker Skin:  no  rashes, no hyperemia Neurological: no tremor with outstretched hands    Diabetic Foot Exam - Simple   No data filed      CMP ( most recent) CMP     Component Value Date/Time   NA 140 02/05/2022 0000   K 4.6 02/05/2022 0000   CL 104 02/05/2022 0000   CO2 28 (A) 02/05/2022 0000   GLUCOSE 130 (H) 09/03/2021 0807   BUN 15 02/05/2022 0000   CREATININE 1.7 (A) 02/05/2022 0000   CREATININE 1.44 (  H) 09/03/2021 0807   CALCIUM 10.4 02/05/2022 0000   PROT 6.9 09/03/2021 0807   ALBUMIN 4.2 02/05/2022 0000   AST 23 02/05/2022 0000   ALT 23 02/05/2022 0000   ALKPHOS 43 02/05/2022 0000   BILITOT 0.4 09/03/2021 0807   GFRNONAA 41 (L) 04/13/2018 1113   GFRAA 39 12/26/2020 0000   GFRAA 47 (L) 04/13/2018 1113     Diabetic Labs (most recent): Lab Results  Component Value Date   HGBA1C 7.5 (A) 05/16/2022   HGBA1C 9.3 02/05/2022   HGBA1C 7.2 (A) 09/10/2021   MICROALBUR 0.5 02/05/2022   MICROALBUR 1.7 12/10/2017     Lipid Panel ( most recent) Lipid Panel     Component Value Date/Time   CHOL 216 (A) 02/05/2022 0000   TRIG 127 02/05/2022 0000   HDL 45 02/05/2022 0000   CHOLHDL 4.2 09/03/2021 0807   LDLCALC 146 02/05/2022 0000   LDLCALC 159 (H) 09/03/2021 0807      Lab Results  Component Value Date   TSH 1.41 02/05/2022   TSH 2.44 09/03/2021   TSH 1.96 08/30/2020   TSH 2.547 08/06/2015   FREET4 1.1 09/03/2021           Assessment & Plan:   1) 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  Her recent A1c checked by her PCP was 8.2% on 08/08/22, increasing from last visit of 7.5%.  Analysis of her CGM shows TIR 37%, TAR 63%, TBR 0% with a GMI of 8.1%.  She denies any hypoglycemia.  She knows her weakness is bread and the occasional Kool-aid.  - Abigail Jackson has currently uncontrolled symptomatic type 2 DM since 56 years of age.   -Recent labs reviewed.  - I had  a long discussion with her about the progressive nature of diabetes and the pathology behind its complications. -her diabetes is complicated by CKD 3 and she remains at a high risk for more acute and chronic complications which include CAD, CVA, CKD, retinopathy, and neuropathy. These are all discussed in detail with her.  - Nutritional counseling repeated at each appointment due to patients tendency to fall back in to old habits.  - The patient admits there is a room for improvement in their diet and drink choices. -  Suggestion is made for the patient to avoid simple carbohydrates from their diet including Cakes, Sweet Desserts / Pastries, Ice Cream, Soda (diet and regular), Sweet Tea, Candies, Chips, Cookies, Sweet Pastries, Store Bought Juices, Alcohol in Excess of 1-2 drinks a day, Artificial Sweeteners, Coffee Creamer, and "Sugar-free" Products. This will help patient to have stable blood glucose profile and potentially avoid unintended weight gain.   - I encouraged the patient to switch to unprocessed or minimally processed complex starch and increased protein intake (animal or plant source), fruits, and vegetables.   - Patient is advised to stick to a routine mealtimes to eat 3 meals a day and avoid unnecessary snacks (to snack only to correct hypoglycemia).  - she will be scheduled with Jearld Fenton, RDN, CDE for diabetes education.  - I have approached her with the following individualized plan to manage her diabetes and patient agrees:   -She will tolerate increase in her Tyler Aas to 50 units SQ nightly.  She can continue her Tradjenta 5 mg po daily.   -She is encouraged to continue monitoring blood glucose twice daily (using her CGM), before breakfast and before  bed, and to call the clinic if she has readings less than 70 or greater than 300 for 3 tests in a row.  She is benefiting from CGM device (from [employer]).  Will recommend she stay on this device.  - she is warned not to take  insulin without proper monitoring per orders. - Adjustment parameters are given to her for hypo and hyperglycemia in writing.  - she is not a candidate for Metformin due to concurrent renal insufficiency.  - Specific targets for  A1c; LDL, HDL, and Triglycerides were discussed with the patient.  2) Blood Pressure /Hypertension:  her blood pressure is controlled to target.   she is advised to continue her current medications including HCTZ 25 mg po daily, Lisinopril 5 mg p.o. daily, and Metoprolol 50 mg po twice daily with breakfast.   3) Lipids/Hyperlipidemia:    Review of her recent lipid panel from 02/05/22 showed uncontrolled LDL at 146 .  she is advised to continue Lipitor 20 mg daily at bedtime and Tricor 145 mg po daily.  Side effects and precautions discussed with her.  4)  Weight/Diet:  her Body mass index is 43.79 kg/m.  -  clearly complicating her diabetes care.   she is a candidate for weight loss. I discussed with her the fact that loss of 5 - 10% of her  current body weight will have the most impact on her diabetes management.  Exercise, and detailed carbohydrates information provided  -  detailed on discharge instructions.  5) Chronic Care/Health Maintenance: -she is on ACEI/ARB and Statin medications and is encouraged to initiate and continue to follow up with Ophthalmology, Dentist, Podiatrist at least yearly or according to recommendations, and advised to stay away from smoking. I have recommended yearly flu vaccine and pneumonia vaccine at least every 5 years; moderate intensity exercise for up to 150 minutes weekly; and sleep for at least 7 hours a day.  - she is advised to maintain close follow up with [facility] for primary care needs, as well as her other providers for optimal and coordinated care.     I spent 30 minutes in the care of the patient today including review of labs from [place], Lipids, Thyroid Function, Hematology (current and  previous including abstractions from other facilities); face-to-face time discussing  her blood glucose readings/logs, discussing hypoglycemia and hyperglycemia episodes and symptoms, medications doses, her options of short and long term treatment based on the latest standards of care / guidelines;  discussion about incorporating lifestyle medicine;  and documenting the encounter. Risk reduction counseling performed per USPSTF guidelines to reduce obesity and cardiovascular risk factors.     Please refer to Patient Instructions for Blood Glucose Monitoring and Insulin/Medications Dosing Guide"  in media tab for additional information. Please  also refer to " Patient Self Inventory" in the Media  tab for reviewed elements of pertinent patient history.  Abigail Jackson participated in the discussions, expressed understanding, and voiced agreement with the above plans.  All questions were answered to her satisfaction. she is encouraged to contact clinic should she have any questions or concerns prior to her return visit.     Follow up plan: - Return in about 3 months (around 11/14/2022) for Diabetes F/U with A1c in office, No previsit labs, Bring meter and logs.   Rayetta Pigg, [facility] [address] [place], [place] [address] Phone: [phone] Fax: [phone]  08/15/2022, 10:00 AM

## 2022-09-16 ENCOUNTER — Other Ambulatory Visit: Payer: Self-pay | Admitting: *Deleted

## 2022-09-16 ENCOUNTER — Other Ambulatory Visit: Payer: Self-pay | Admitting: Nurse Practitioner

## 2022-09-16 DIAGNOSIS — E1122 Type 2 diabetes mellitus with diabetic chronic kidney disease: Secondary | ICD-10-CM

## 2022-09-16 DIAGNOSIS — N1832 Chronic kidney disease, stage 3b: Secondary | ICD-10-CM

## 2022-09-16 MED ORDER — TRESIBA FLEXTOUCH 100 UNIT/ML ~~LOC~~ SOPN: 50.0000 [IU] | PEN_INJECTOR | Freq: Every day | SUBCUTANEOUS | 0 refills | Status: DC

## 2022-09-16 NOTE — Progress Notes (Unsigned)
Patient will need updated refill on Tresiba , the dose has been increased to 50 units at bedtime.

## 2022-09-20 ENCOUNTER — Other Ambulatory Visit: Payer: Self-pay | Admitting: *Deleted

## 2022-09-20 DIAGNOSIS — Z794 Long term (current) use of insulin: Secondary | ICD-10-CM

## 2022-09-20 MED ORDER — TRESIBA FLEXTOUCH 100 UNIT/ML ~~LOC~~ SOPN: 50.0000 [IU] | PEN_INJECTOR | Freq: Every day | SUBCUTANEOUS | 0 refills | Status: DC

## 2022-09-27 ENCOUNTER — Other Ambulatory Visit: Payer: Self-pay | Admitting: Nurse Practitioner

## 2022-09-27 DIAGNOSIS — Z794 Long term (current) use of insulin: Secondary | ICD-10-CM

## 2022-11-19 ENCOUNTER — Ambulatory Visit (INDEPENDENT_AMBULATORY_CARE_PROVIDER_SITE_OTHER): Payer: 59 | Admitting: Nurse Practitioner

## 2022-11-19 ENCOUNTER — Encounter: Payer: Self-pay | Admitting: Nurse Practitioner

## 2022-11-19 VITALS — BP 135/81 | HR 80 | Ht 63.0 in | Wt 248.4 lb

## 2022-11-19 DIAGNOSIS — Z794 Long term (current) use of insulin: Secondary | ICD-10-CM | POA: Diagnosis not present

## 2022-11-19 DIAGNOSIS — I10 Essential (primary) hypertension: Secondary | ICD-10-CM

## 2022-11-19 DIAGNOSIS — N1832 Chronic kidney disease, stage 3b: Secondary | ICD-10-CM | POA: Diagnosis not present

## 2022-11-19 DIAGNOSIS — E782 Mixed hyperlipidemia: Secondary | ICD-10-CM

## 2022-11-19 DIAGNOSIS — E041 Nontoxic single thyroid nodule: Secondary | ICD-10-CM | POA: Diagnosis not present

## 2022-11-19 DIAGNOSIS — E1122 Type 2 diabetes mellitus with diabetic chronic kidney disease: Secondary | ICD-10-CM | POA: Diagnosis not present

## 2022-11-19 LAB — POCT GLYCOSYLATED HEMOGLOBIN (HGB A1C): Hemoglobin A1C: 9 % — AB (ref 4.0–5.6)

## 2022-11-19 MED ORDER — TRESIBA FLEXTOUCH 100 UNIT/ML ~~LOC~~ SOPN: 60.0000 [IU] | PEN_INJECTOR | Freq: Every day | SUBCUTANEOUS | 3 refills | Status: DC

## 2022-11-19 NOTE — Progress Notes (Signed)
Endocrinology Follow Up Note       11/19/2022, 9:36 AM   Subjective:    Patient ID: Abigail Jackson, female    DOB: Jan 04, 1966.  Abigail Jackson is being seen in follow up after being seen in follow up after being seen in consultation for management of currently uncontrolled symptomatic diabetes requested by  [facility].   Past Medical History:  Diagnosis Date   Arthritis    Diabetes mellitus without complication ([place])    Heart murmur    Hyperlipidemia    Hypertension    Irregular bowel habits    Renal neoplasm    LEFT    Past Surgical History:  Procedure Laterality Date   ROBOTIC ASSITED PARTIAL NEPHRECTOMY Left 08/03/2015   Procedure: ROBOTIC ASSISTED PARTIAL LEFT NEPHRECTOMY;  Surgeon: Raynelle Bring, MD;  Location: [facility];  Service: Urology;  Laterality: Left;   SHOULDER SURGERY  2006   RIGHT    Social History   Socioeconomic History   Marital status: Married    Spouse name: Not on file   Number of children: Not on file   Years of education: Not on file   Highest education level: Not on file  Occupational History   Not on file  Tobacco Use   Smoking status: Never   Smokeless tobacco: Never  Vaping Use   Vaping Use: Never used  Substance and Sexual Activity   Alcohol use: No   Drug use: No   Sexual activity: Not on file  Other Topics Concern   Not on file  Social History Narrative   Not on file   Social Determinants of Health   Financial Resource Strain: Not on file  Food Insecurity: Not on file  Transportation Needs: Not on file  Physical Activity: Not on file  Stress: Not on file  Social Connections: Not on file    Family History  Problem Relation Age of Onset   Diabetes Mother    Hypertension Mother    Diabetes Father    Hypertension Father    Thyroid disease Father    Hyperlipidemia Father    Kidney disease Father    Heart failure Father      Outpatient Encounter Medications as of 11/19/2022  Medication Sig   amitriptyline (ELAVIL) 100 MG tablet Take 100 mg by mouth at bedtime.   amLODipine (NORVASC) 5 MG tablet Take 1 tablet by mouth daily.   aspirin 81 MG EC tablet 1 tablet   cholecalciferol (VITAMIN D) 400 units TABS tablet Take 400 Units by mouth.   dicyclomine (BENTYL) 10 MG capsule Take 1 capsule by mouth 2 (two) times daily.   Easy Touch Lancets 30G MISC USE AS DIRECTED TO TEST BLOOD GLUCOSE FOUR TIMES A DAY   ezetimibe (ZETIA) 10 MG tablet Take 1 tablet by mouth daily.   fenofibrate (TRICOR) 145 MG tablet Take 145 mg by mouth daily.   glucose blood test strip 1 each by Other route 4 (four) times daily. Use as instructed 4 x daily. E11.65   Insulin Pen Needle (B-D ULTRAFINE III SHORT PEN) 31G X 8 MM MISC USE WITH INSULIN PEN AT BEDTIME   metoprolol succinate (  TOPROL-XL) 50 MG 24 hr tablet Take 1 tablet (50 mg total) by mouth daily. Take with or immediately following a meal. (Patient taking differently: Take 50 mg by mouth 2 (two) times daily. Take with or immediately following a meal.)   metoprolol tartrate (LOPRESSOR) 50 MG tablet Take 50 mg by mouth daily.   omeprazole (PRILOSEC) 40 MG capsule Take 1 capsule (40 mg total) by mouth at bedtime.   oxyCODONE (OXY IR/ROXICODONE) 5 MG immediate release tablet Take by mouth.   pregabalin (LYRICA) 50 MG capsule Take 50 mg by mouth 2 (two) times daily.   TRADJENTA 5 MG TABS tablet Take 1 tablet (5 mg total) by mouth daily.   [DISCONTINUED] insulin degludec (TRESIBA FLEXTOUCH) 100 UNIT/ML FlexTouch Pen Inject 50 Units into the skin at bedtime.   acetaminophen (TYLENOL) 325 MG tablet Take by mouth. (Patient not taking: Reported on 11/19/2022)   cyclobenzaprine (FLEXERIL) 10 MG tablet Take 10 mg by mouth 3 (three) times daily. (Patient not taking: Reported on 11/19/2022)   gabapentin (NEURONTIN) 100 MG capsule Take 100 mg by mouth 2 (two) times daily. (Patient not taking: Reported on  11/19/2022)   insulin degludec (TRESIBA FLEXTOUCH) 100 UNIT/ML FlexTouch Pen Inject 60 Units into the skin at bedtime.   LORazepam (ATIVAN) 1 MG tablet  (Patient not taking: Reported on 11/19/2022)   No facility-administered encounter medications on file as of 11/19/2022.    ALLERGIES: Allergies  Allergen Reactions   Tramadol Itching and Rash    VACCINATION STATUS:  There is no immunization history on file for this patient.  Diabetes She presents for her follow-up diabetic visit. She has type 2 diabetes mellitus. Onset time: Diagnosed at approx age of 61. Her disease course has been worsening. There are no hypoglycemic associated symptoms. Associated symptoms include fatigue. There are no hypoglycemic complications. Symptoms are stable. Diabetic complications include nephropathy. Risk factors for coronary artery disease include diabetes mellitus, dyslipidemia, family history, obesity, hypertension and sedentary lifestyle. Current diabetic treatment includes insulin injections and oral agent (monotherapy). She is compliant with treatment most of the time. Her weight is increasing steadily. She is following a generally healthy diet. When asked about meal planning, she reported none. She has not had a previous visit with a dietitian. She rarely participates in exercise. Her home blood glucose trend is increasing steadily. Her overall blood glucose range is >200 mg/dl. (She presents today with her CGM showing above target glycemic profile overall.  Her POCT A1c today is 9%, worsening from last visit of 8.2%.  Analysis of her CGM shows TIR 11%, TAR 89%, TBR 0% with a GMI of 9.1%.  She admits to eating foods she knows she shouldn't, including Koolaid, white bread, and ketchup.) An ACE inhibitor/angiotensin II receptor blocker is being taken. She does not see a podiatrist.Eye exam is current.  Hyperlipidemia This is a chronic problem. The current episode started more than 1 year ago. The problem is  uncontrolled. Recent lipid tests were reviewed and are high. Exacerbating diseases include chronic renal disease, diabetes and obesity. Factors aggravating her hyperlipidemia include beta blockers, thiazides and fatty foods. Current antihyperlipidemic treatment includes statins. The current treatment provides moderate improvement of lipids. Compliance problems include adherence to diet and adherence to exercise.  Risk factors for coronary artery disease include diabetes mellitus, dyslipidemia, family history, hypertension, obesity and a sedentary lifestyle.  Hypertension This is a chronic problem. The current episode started more than 1 year ago. The problem has been waxing and waning since onset.  The problem is uncontrolled. There are no associated agents to hypertension. Risk factors for coronary artery disease include diabetes mellitus, dyslipidemia, family history, obesity and sedentary lifestyle. Past treatments include beta blockers, diuretics and ACE inhibitors. The current treatment provides moderate improvement. There are no compliance problems.  Hypertensive end-organ damage includes kidney disease. with hx of left nephrectomy. Identifiable causes of hypertension include chronic renal disease and sleep apnea.     Review of systems  Constitutional: +increasing body weight,  current Body mass index is 44 kg/m. , + fatigue, no subjective hyperthermia, no subjective hypothermia Eyes: no blurry vision, no xerophthalmia ENT: no sore throat, no nodules palpated in throat, no dysphagia/odynophagia, no hoarseness Cardiovascular: no chest pain, no shortness of breath, no palpitations, no leg swelling Respiratory: no cough, no shortness of breath Gastrointestinal: no nausea/vomiting/diarrhea Musculoskeletal: c/o intermittent back pain- walks with walker Skin: no rashes, no hyperemia Neurological: no tremors, no numbness, no tingling, no dizziness Psychiatric: no depression, no anxiety  Objective:      BP 135/81 (BP Location: Right Arm, Patient Position: Sitting, Cuff Size: Large)   Pulse 80   Ht 5\' 3"  (1.6 m)   Wt 248 lb 6.4 oz (112.7 kg)   BMI 44.00 kg/m   Wt Readings from Last 3 Encounters:  11/19/22 248 lb 6.4 oz (112.7 kg)  08/15/22 247 lb 3.2 oz (112.1 kg)  05/16/22 241 lb (109.3 kg)     BP Readings from Last 3 Encounters:  11/19/22 135/81  08/15/22 121/72  05/16/22 (!) 145/80     Physical Exam- Limited  Constitutional:  Body mass index is 44 kg/m., not in acute distress, normal state of mind Eyes:  EOMI, no exophthalmos Musculoskeletal: no gross deformities, strength intact in all four extremities, no gross restriction of joint movements, walks with walker Skin:  no rashes, no hyperemia Neurological: no tremor with outstretched hands    Diabetic Foot Exam - Simple   No data filed      CMP ( most recent) CMP     Component Value Date/Time   NA 140 02/05/2022 0000   K 4.6 02/05/2022 0000   CL 104 02/05/2022 0000   CO2 28 (A) 02/05/2022 0000   GLUCOSE 130 (H) 09/03/2021 0807   BUN 15 02/05/2022 0000   CREATININE 1.7 (A) 02/05/2022 0000   CREATININE 1.44 (H) 09/03/2021 0807   CALCIUM 10.4 02/05/2022 0000   PROT 6.9 09/03/2021 0807   ALBUMIN 4.2 02/05/2022 0000   AST 23 02/05/2022 0000   ALT 23 02/05/2022 0000   ALKPHOS 43 02/05/2022 0000   BILITOT 0.4 09/03/2021 0807   GFRNONAA 41 (L) 04/13/2018 1113   GFRAA 39 12/26/2020 0000   GFRAA 47 (L) 04/13/2018 1113     Diabetic Labs (most recent): Lab Results  Component Value Date   HGBA1C 9.0 (A) 11/19/2022   HGBA1C 7.5 (A) 05/16/2022   HGBA1C 9.3 02/05/2022   MICROALBUR 0.5 02/05/2022   MICROALBUR 1.7 12/10/2017     Lipid Panel ( most recent) Lipid Panel     Component Value Date/Time   CHOL 216 (A) 02/05/2022 0000   TRIG 127 02/05/2022 0000   HDL 45 02/05/2022 0000   CHOLHDL 4.2 09/03/2021 0807   LDLCALC 146 02/05/2022 0000   LDLCALC 159 (H) 09/03/2021 0807      Lab Results   Component Value Date   TSH 1.41 02/05/2022   TSH 2.44 09/03/2021   TSH 1.96 08/30/2020   TSH 2.547 08/06/2015   FREET4 1.1 09/03/2021  Assessment & Plan:   1) 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  Her POCT A1c today is 9%, worsening from last visit of 8.2%.  Analysis of her CGM shows TIR 11%, TAR 89%, TBR 0% with a GMI of 9.1%.  She admits to eating foods she knows she shouldn't, including Koolaid, white bread, and ketchup.  - Abigail Jackson has currently uncontrolled symptomatic type 2 DM since 57 years of age.   -Recent labs reviewed.  - I had a long discussion with her about the progressive nature of diabetes and the pathology behind its complications. -her diabetes is complicated by CKD 3 and she remains at a high risk for more acute and chronic complications which include CAD, CVA, CKD, retinopathy, and neuropathy. These are all discussed in detail with her.  - Nutritional counseling repeated at each appointment due to patients tendency to fall back in to old habits.  - The patient admits there is a room for improvement in their diet and drink choices. -  Suggestion is made for the patient to avoid simple carbohydrates from their diet including Cakes, Sweet Desserts / Pastries, Ice Cream, Soda (diet and regular), Sweet Tea, Candies, Chips, Cookies, Sweet Pastries, Store Bought Juices, Alcohol in Excess of 1-2 drinks a day, Artificial Sweeteners, Coffee Creamer, and "Sugar-free" Products. This will help patient to have stable blood glucose profile and potentially avoid unintended weight gain.   - I encouraged the patient to switch to unprocessed or minimally processed complex starch and increased protein intake (animal or plant source), fruits, and vegetables.   - Patient is advised to stick to a routine mealtimes to eat 3 meals a day and avoid unnecessary snacks (to  snack only to correct hypoglycemia).  - she will be scheduled with Jearld Fenton, RDN, CDE for diabetes education.  - I have approached her with the following individualized plan to manage her diabetes and patient agrees:   -She will tolerate increase in her Tresiba to 60 units SQ nightly.  She can continue her Tradjenta 5 mg po daily.   We discussed possibility of changing her to premixed insulin to get the added benefit of some short-acting insulin in the future.  -She is encouraged to continue monitoring blood glucose twice daily (using her CGM), before breakfast and before bed, and to call the clinic if she has readings less than 70 or greater than 300 for 3 tests in a row.  She is benefiting from CGM device (from [employer]).  Will recommend she stay on this device.  - she is warned not to take insulin without proper monitoring per orders. - Adjustment parameters are given to her for hypo and hyperglycemia in writing.  - she is not a candidate for Metformin due to concurrent renal insufficiency.  - Specific targets for  A1c; LDL, HDL, and Triglycerides were discussed with the patient.  2) Blood Pressure /Hypertension:  her blood pressure is controlled to target.   she is advised to continue her current medications including HCTZ 25 mg po daily, Lisinopril 5 mg p.o. daily, and Metoprolol 50 mg po twice daily with breakfast.   3) Lipids/Hyperlipidemia:    Review of her recent lipid panel from 02/05/22 showed uncontrolled LDL at 146 .  she is advised to continue Lipitor 20 mg daily at bedtime and Tricor 145 mg po daily.  Side effects and precautions discussed with her.  Will recheck lipid panel  prior to next visit.  4)  Weight/Diet:  her Body mass index is 44 kg/m.  -  clearly complicating her diabetes care.   she is a candidate for weight loss. I discussed with her the fact that loss of 5 - 10% of her  current body weight will have the most impact on her diabetes management.  Exercise, and  detailed carbohydrates information provided  -  detailed on discharge instructions.  5) Thyroid nodule, incidentally found on imaging She was seen in ED in [place] for angina and had imaging identifying a thyroid nodule, report not available to review.  I will order dedicated thyroid ultrasound to assess further.  6) Chronic Care/Health Maintenance: -she is on ACEI/ARB and Statin medications and is encouraged to initiate and continue to follow up with Ophthalmology, Dentist, Podiatrist at least yearly or according to recommendations, and advised to stay away from smoking. I have recommended yearly flu vaccine and pneumonia vaccine at least every 5 years; moderate intensity exercise for up to 150 minutes weekly; and sleep for at least 7 hours a day.  - she is advised to maintain close follow up with [facility] for primary care needs, as well as her other providers for optimal and coordinated care.      I spent  50  minutes in the care of the patient today including review of labs from [facility], Lipids, Thyroid Function, Hematology (current and previous including abstractions from other facilities); face-to-face time discussing  her blood glucose readings/logs, discussing hypoglycemia and hyperglycemia episodes and symptoms, medications doses, her options of short and long term treatment based on the latest standards of care / guidelines;  discussion about incorporating lifestyle medicine;  and documenting the encounter. Risk reduction counseling performed per USPSTF guidelines to reduce obesity and cardiovascular risk factors.     Please refer to Patient Instructions for Blood Glucose Monitoring and Insulin/Medications Dosing Guide"  in media tab for additional information. Please  also refer to " Patient Self Inventory" in the Media  tab for reviewed elements of pertinent patient history.  Abigail Jackson participated in the discussions, expressed understanding, and voiced  agreement with the above plans.  All questions were answered to her satisfaction. she is encouraged to contact clinic should she have any questions or concerns prior to her return visit.     Follow up plan: - Return in about 3 months (around 02/19/2023) for Diabetes F/U with A1c in office, No previsit labs, Bring meter and logs.   Rayetta Pigg, [facility] [address] [place], [place] [address] Phone: [phone] Fax: [phone]  11/19/2022, 9:36 AM

## 2022-11-20 ENCOUNTER — Other Ambulatory Visit: Payer: Self-pay | Admitting: *Deleted

## 2022-11-20 DIAGNOSIS — E1122 Type 2 diabetes mellitus with diabetic chronic kidney disease: Secondary | ICD-10-CM

## 2022-11-20 MED ORDER — TRESIBA FLEXTOUCH 100 UNIT/ML ~~LOC~~ SOPN: 60.0000 [IU] | PEN_INJECTOR | Freq: Every day | SUBCUTANEOUS | 3 refills | Status: DC

## 2022-11-20 NOTE — Telephone Encounter (Signed)
Patient states that she needs to have her insulin sent to her mail order, as it was changed from 50 -60 units.  Patient was called and made aware and verified that it was to be sent to [facility] mail order.

## 2022-12-02 ENCOUNTER — Telehealth: Payer: Self-pay | Admitting: *Deleted

## 2022-12-02 NOTE — Telephone Encounter (Signed)
Patient takes her shot at 8 and the rest of her meds at 9, he wants to know if she can take then all at the sane time?  She feels that it would be better on her to do this. Patient was advised that I would address with NP and call her back.

## 2022-12-02 NOTE — Telephone Encounter (Signed)
Patient was called and made aware. 

## 2022-12-02 NOTE — Telephone Encounter (Signed)
Abigail Jackson should be taken at bedtime and Tradjenta should be taken at breakfast to be most effective.  If she has other night time meds, she can take those at the same time as Abigail insulin though.  Hope this answers Abigail question.

## 2022-12-03 ENCOUNTER — Ambulatory Visit (HOSPITAL_COMMUNITY)
Admission: RE | Admit: 2022-12-03 | Discharge: 2022-12-03 | Disposition: A | Discharge status: Home / Self Care | Payer: 59 | Source: Ambulatory Visit | Attending: Nurse Practitioner | Admitting: Nurse Practitioner

## 2022-12-03 DIAGNOSIS — E041 Nontoxic single thyroid nodule: Secondary | ICD-10-CM | POA: Insufficient documentation

## 2022-12-03 NOTE — Progress Notes (Signed)
Patient was called and a message was left with the results.

## 2022-12-03 NOTE — Progress Notes (Signed)
Patient was called and a message was left with the results. 

## 2022-12-03 NOTE — Progress Notes (Signed)
Please let patient know that I received her thyroid ultrasound results back and it does show that nodule on there but it does not require any tissue sampling.  They recommend repeating the ultrasound in 1 year, which is what we will plan on doing.

## 2023-02-05 ENCOUNTER — Ambulatory Visit: Payer: 59 | Admitting: Internal Medicine

## 2023-02-14 LAB — LIPID PANEL
LDL Cholesterol: 148
Triglycerides: 184 — AB (ref 40–160)

## 2023-02-14 LAB — COMPREHENSIVE METABOLIC PANEL: eGFR: 44

## 2023-02-14 LAB — BASIC METABOLIC PANEL
BUN: 20 (ref 4–21)
Creatinine: 1.4 — AB (ref 0.5–1.1)
Glucose: 182

## 2023-02-14 LAB — HEMOGLOBIN A1C: Hemoglobin A1C: 8.3

## 2023-02-14 LAB — VITAMIN D 25 HYDROXY (VIT D DEFICIENCY, FRACTURES): Vit D, 25-Hydroxy: 32

## 2023-02-14 LAB — TSH: TSH: 1.31 (ref 0.41–5.90)

## 2023-02-19 ENCOUNTER — Encounter: Payer: Self-pay | Admitting: Nurse Practitioner

## 2023-02-19 ENCOUNTER — Ambulatory Visit (INDEPENDENT_AMBULATORY_CARE_PROVIDER_SITE_OTHER): Payer: 59 | Admitting: Nurse Practitioner

## 2023-02-19 VITALS — BP 118/71 | HR 84 | Ht 63.0 in | Wt 250.8 lb

## 2023-02-19 DIAGNOSIS — I10 Essential (primary) hypertension: Secondary | ICD-10-CM

## 2023-02-19 DIAGNOSIS — N1832 Chronic kidney disease, stage 3b: Secondary | ICD-10-CM

## 2023-02-19 DIAGNOSIS — E041 Nontoxic single thyroid nodule: Secondary | ICD-10-CM | POA: Diagnosis not present

## 2023-02-19 DIAGNOSIS — Z7984 Long term (current) use of oral hypoglycemic drugs: Secondary | ICD-10-CM

## 2023-02-19 DIAGNOSIS — E1122 Type 2 diabetes mellitus with diabetic chronic kidney disease: Secondary | ICD-10-CM | POA: Diagnosis not present

## 2023-02-19 DIAGNOSIS — Z794 Long term (current) use of insulin: Secondary | ICD-10-CM

## 2023-02-19 DIAGNOSIS — E782 Mixed hyperlipidemia: Secondary | ICD-10-CM | POA: Diagnosis not present

## 2023-02-19 MED ORDER — TRADJENTA 5 MG PO TABS: 5.0000 mg | ORAL_TABLET | Freq: Every day | ORAL | 3 refills | Status: DC

## 2023-02-19 MED ORDER — TRESIBA FLEXTOUCH 100 UNIT/ML ~~LOC~~ SOPN: 70.0000 [IU] | PEN_INJECTOR | Freq: Every day | SUBCUTANEOUS | 3 refills | Status: DC

## 2023-02-19 NOTE — Progress Notes (Signed)
Endocrinology Follow Up Note       02/19/2023, 8:26 AM   Subjective:    Patient ID: Abigail Jackson, female    DOB: [birth year].  Abigail Jackson is being seen in follow up after being seen in follow up after being seen in consultation for management of currently uncontrolled symptomatic diabetes requested by  Wilmon Pali, FNP.   Past Medical History:  Diagnosis Date   Arthritis    Diabetes mellitus without complication (HCC)    Heart murmur    Hyperlipidemia    Hypertension    Irregular bowel habits    Renal neoplasm    LEFT    Past Surgical History:  Procedure Laterality Date   ROBOTIC ASSITED PARTIAL NEPHRECTOMY Left 08/03/2015   Procedure: ROBOTIC ASSISTED PARTIAL LEFT NEPHRECTOMY;  Surgeon: Heloise Purpura, MD;  Location: [facility];  Service: Urology;  Laterality: Left;   SHOULDER SURGERY  2006   RIGHT    Social History   Socioeconomic History   Marital status: Married    Spouse name: Not on file   Number of children: Not on file   Years of education: Not on file   Highest education level: Not on file  Occupational History   Not on file  Tobacco Use   Smoking status: Never   Smokeless tobacco: Never  Vaping Use   Vaping Use: Never used  Substance and Sexual Activity   Alcohol use: No   Drug use: No   Sexual activity: Not on file  Other Topics Concern   Not on file  Social History Narrative   Not on file   Social Determinants of Health   Financial Resource Strain: Not on file  Food Insecurity: Not on file  Transportation Needs: Not on file  Physical Activity: Not on file  Stress: Not on file  Social Connections: Not on file    Family History  Problem Relation Age of Onset   Diabetes Mother    Hypertension Mother    Diabetes Father    Hypertension Father    Thyroid disease Father    Hyperlipidemia Father    Kidney disease Father    Heart failure Father     Outpatient  Encounter Medications as of 02/19/2023  Medication Sig   acetaminophen (TYLENOL) 325 MG tablet Take by mouth.   amitriptyline (ELAVIL) 100 MG tablet Take 100 mg by mouth at bedtime.   amLODipine (NORVASC) 5 MG tablet Take 1 tablet by mouth daily.   aspirin 81 MG EC tablet 1 tablet   cholecalciferol (VITAMIN D) 400 units TABS tablet Take 400 Units by mouth.   dicyclomine (BENTYL) 10 MG capsule Take 1 capsule by mouth 2 (two) times daily.   Easy Touch Lancets 30G MISC USE AS DIRECTED TO TEST BLOOD GLUCOSE FOUR TIMES A DAY   ezetimibe (ZETIA) 10 MG tablet Take 1 tablet by mouth daily.   fenofibrate (TRICOR) 145 MG tablet Take 145 mg by mouth daily.   glucose blood test strip 1 each by Other route 4 (four) times daily. Use as instructed 4 x daily. E11.65   Insulin Pen Needle (B-D ULTRAFINE III SHORT PEN) 31G X 8 MM MISC USE WITH  INSULIN PEN AT BEDTIME   metoprolol succinate (TOPROL-XL) 50 MG 24 hr tablet Take 1 tablet (50 mg total) by mouth daily. Take with or immediately following a meal. (Patient taking differently: Take 50 mg by mouth 2 (two) times daily. Take with or immediately following a meal.)   metoprolol tartrate (LOPRESSOR) 50 MG tablet Take 50 mg by mouth daily.   omeprazole (PRILOSEC) 40 MG capsule Take 1 capsule (40 mg total) by mouth at bedtime.   oxyCODONE (OXY IR/ROXICODONE) 5 MG immediate release tablet Take by mouth.   pregabalin (LYRICA) 50 MG capsule Take 50 mg by mouth 2 (two) times daily.   [DISCONTINUED] insulin degludec (TRESIBA FLEXTOUCH) 100 UNIT/ML FlexTouch Pen Inject 60 Units into the skin at bedtime.   [DISCONTINUED] TRADJENTA 5 MG TABS tablet Take 1 tablet (5 mg total) by mouth daily.   insulin degludec (TRESIBA FLEXTOUCH) 100 UNIT/ML FlexTouch Pen Inject 70 Units into the skin at bedtime.   TRADJENTA 5 MG TABS tablet Take 1 tablet (5 mg total) by mouth daily.   [DISCONTINUED] cyclobenzaprine (FLEXERIL) 10 MG tablet Take 10 mg by mouth 3 (three) times daily. (Patient  not taking: Reported on 11/19/2022)   [DISCONTINUED] gabapentin (NEURONTIN) 100 MG capsule Take 100 mg by mouth 2 (two) times daily. (Patient not taking: Reported on 11/19/2022)   [DISCONTINUED] LORazepam (ATIVAN) 1 MG tablet  (Patient not taking: Reported on 11/19/2022)   No facility-administered encounter medications on file as of 02/19/2023.    ALLERGIES: Allergies  Allergen Reactions   Tramadol Itching and Rash    VACCINATION STATUS:  There is no immunization history on file for this patient.  Diabetes She presents for her follow-up diabetic visit. She has type 2 diabetes mellitus. Onset time: Diagnosed at approx age of 35. Her disease course has been improving. There are no hypoglycemic associated symptoms. Associated symptoms include fatigue. There are no hypoglycemic complications. Symptoms are stable. Diabetic complications include nephropathy. Risk factors for coronary artery disease include diabetes mellitus, dyslipidemia, family history, obesity, hypertension and sedentary lifestyle. Current diabetic treatment includes insulin injections and oral agent (monotherapy). She is compliant with treatment most of the time. Her weight is increasing steadily. She is following a generally healthy diet. When asked about meal planning, she reported none. She has not had a previous visit with a dietitian. She rarely participates in exercise. Her home blood glucose trend is decreasing steadily. Her overall blood glucose range is >200 mg/dl. (She presents today with her CGM showing improved yet still above target glycemic profile overall.  Her most recent A1c checked by her PCP on 6/24 was around 8% (exact value not available to review), improving from last visit of 9%.  Analysis of her CGM shows TIR 20%, TAR 80%, TBR 0% with a GMI of 8.4%.  ) An ACE inhibitor/angiotensin II receptor blocker is being taken. She does not see a podiatrist.Eye exam is current.  Hyperlipidemia This is a chronic problem. The  current episode started more than 1 year ago. The problem is uncontrolled. Recent lipid tests were reviewed and are high. Exacerbating diseases include chronic renal disease, diabetes and obesity. Factors aggravating her hyperlipidemia include beta blockers, thiazides and fatty foods. Current antihyperlipidemic treatment includes statins. The current treatment provides moderate improvement of lipids. Compliance problems include adherence to diet and adherence to exercise.  Risk factors for coronary artery disease include diabetes mellitus, dyslipidemia, family history, hypertension, obesity and a sedentary lifestyle.  Hypertension This is a chronic problem. The current episode started  more than 1 year ago. The problem has been waxing and waning since onset. The problem is uncontrolled. There are no associated agents to hypertension. Risk factors for coronary artery disease include diabetes mellitus, dyslipidemia, family history, obesity and sedentary lifestyle. Past treatments include beta blockers, diuretics and ACE inhibitors. The current treatment provides moderate improvement. There are no compliance problems.  Hypertensive end-organ damage includes kidney disease. with hx of left nephrectomy. Identifiable causes of hypertension include chronic renal disease and sleep apnea.     Review of systems  Constitutional: + increasing body weight,  current Body mass index is 44.43 kg/m. , + fatigue, no subjective hyperthermia, no subjective hypothermia Eyes: no blurry vision, no xerophthalmia ENT: no sore throat, no nodules palpated in throat, no dysphagia/odynophagia, no hoarseness Cardiovascular: no chest pain, no shortness of breath, no palpitations, no leg swelling Respiratory: no cough, no shortness of breath Gastrointestinal: no nausea/vomiting/diarrhea Musculoskeletal: c/o intermittent back pain- walks with walker; right arm in sling from recent fall Skin: no rashes, no hyperemia Neurological: no  tremors, no numbness, no tingling, no dizziness Psychiatric: no depression, no anxiety  Objective:     BP 118/71 (BP Location: Left Arm, Patient Position: Sitting, Cuff Size: Large)   Pulse 84   Ht 5\' 3"  (1.6 m)   Wt 250 lb 12.8 oz (113.8 kg)   BMI 44.43 kg/m   Wt Readings from Last 3 Encounters:  02/19/23 250 lb 12.8 oz (113.8 kg)  11/19/22 248 lb 6.4 oz (112.7 kg)  08/15/22 247 lb 3.2 oz (112.1 kg)     BP Readings from Last 3 Encounters:  02/19/23 118/71  11/19/22 135/81  08/15/22 121/72     Physical Exam- Limited  Constitutional:  Body mass index is 44.43 kg/m., not in acute distress, normal state of mind Eyes:  EOMI, no exophthalmos Musculoskeletal: no gross deformities, strength intact in all four extremities, no gross restriction of joint movements, walks with walker, right arm in sling from recent fall Skin:  no rashes, no hyperemia Neurological: no tremor with outstretched hands    Diabetic Foot Exam - Simple   No data filed      CMP ( most recent) CMP     Component Value Date/Time   NA 140 02/05/2022 0000   K 4.6 02/05/2022 0000   CL 104 02/05/2022 0000   CO2 28 (A) 02/05/2022 0000   GLUCOSE 130 (H) 09/03/2021 0807   BUN 15 02/05/2022 0000   CREATININE 1.7 (A) 02/05/2022 0000   CREATININE 1.44 (H) 09/03/2021 0807   CALCIUM 10.4 02/05/2022 0000   PROT 6.9 09/03/2021 0807   ALBUMIN 4.2 02/05/2022 0000   AST 23 02/05/2022 0000   ALT 23 02/05/2022 0000   ALKPHOS 43 02/05/2022 0000   BILITOT 0.4 09/03/2021 0807   GFRNONAA 41 (L) 04/13/2018 1113   GFRAA 39 12/26/2020 0000   GFRAA 47 (L) 04/13/2018 1113     Diabetic Labs (most recent): Lab Results  Component Value Date   HGBA1C 9.0 (A) 11/19/2022   HGBA1C 7.5 (A) 05/16/2022   HGBA1C 9.3 02/05/2022   MICROALBUR 0.5 02/05/2022   MICROALBUR 1.7 12/10/2017     Lipid Panel ( most recent) Lipid Panel     Component Value Date/Time   CHOL 216 (A) 02/05/2022 0000   TRIG 127 02/05/2022 0000    HDL 45 02/05/2022 0000   CHOLHDL 4.2 09/03/2021 0807   LDLCALC 146 02/05/2022 0000   LDLCALC 159 (H) 09/03/2021 0807      Lab  Results  Component Value Date   TSH 1.41 02/05/2022   TSH 2.44 09/03/2021   TSH 1.96 08/30/2020   TSH 2.547 08/06/2015   FREET4 1.1 09/03/2021           Assessment & Plan:   1) 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  Her most recent A1c checked by her PCP on 6/24 was around 8% (exact value not available to review), improving from last visit of 9%.  Analysis of her CGM shows TIR 20%, TAR 80%, TBR 0% with a GMI of 8.4%.    - Abigail Jackson has currently uncontrolled symptomatic type 2 DM since 57 years of age.   -Recent labs reviewed.  - I had a long discussion with her about the progressive nature of diabetes and the pathology behind its complications. -her diabetes is complicated by CKD 3 and she remains at a high risk for more acute and chronic complications which include CAD, CVA, CKD, retinopathy, and neuropathy. These are all discussed in detail with her.  - Nutritional counseling repeated at each appointment due to patients tendency to fall back in to old habits.  - The patient admits there is a room for improvement in their diet and drink choices. -  Suggestion is made for the patient to avoid simple carbohydrates from their diet including Cakes, Sweet Desserts / Pastries, Ice Cream, Soda (diet and regular), Sweet Tea, Candies, Chips, Cookies, Sweet Pastries, Store Bought Juices, Alcohol in Excess of 1-2 drinks a day, Artificial Sweeteners, Coffee Creamer, and "Sugar-free" Products. This will help patient to have stable blood glucose profile and potentially avoid unintended weight gain.   - I encouraged the patient to switch to unprocessed or minimally processed complex starch and increased protein intake (animal or plant source),  fruits, and vegetables.   - Patient is advised to stick to a routine mealtimes to eat 3 meals a day and avoid unnecessary snacks (to snack only to correct hypoglycemia).  - she will be scheduled with Norm Salt, RDN, CDE for diabetes education.  - I have approached her with the following individualized plan to manage her diabetes and patient agrees:   -She will tolerate increase in her Tresiba to 70 units SQ nightly.  She can continue her Tradjenta 5 mg po daily.   We discussed possibility of changing her to premixed insulin to get the added benefit of some short-acting insulin in the future.  She expressed her interest in Omnipod in the future, which I think we can avoid at this time.  -She is encouraged to continue monitoring blood glucose twice daily (using her CGM), before breakfast and before bed, and to call the clinic if she has readings less than 70 or greater than 300 for 3 tests in a row.  She is benefiting from CGM device (from [employer]).  Will recommend she stay on this device.  - she is warned not to take insulin without proper monitoring per orders. - Adjustment parameters are given to her for hypo and hyperglycemia in writing.  - she is not a candidate for Metformin due to concurrent renal insufficiency.  - Specific targets for  A1c; LDL, HDL, and Triglycerides were discussed with the patient.  2) Blood Pressure /Hypertension:  her blood pressure is controlled to target.   she is advised to continue her current medications including HCTZ 25 mg po daily, Lisinopril 5 mg p.o. daily,  and Metoprolol 50 mg po twice daily with breakfast.   3) Lipids/Hyperlipidemia:    Review of her recent lipid panel from 02/05/22 showed uncontrolled LDL at 146 .  she is advised to continue Lipitor 20 mg daily at bedtime and Tricor 145 mg po daily.  Side effects and precautions discussed with her.  Will recheck lipid panel prior to next visit.  4)  Weight/Diet:  her Body mass index is 44.43  kg/m.  -  clearly complicating her diabetes care.   she is a candidate for weight loss. I discussed with her the fact that loss of 5 - 10% of her  current body weight will have the most impact on her diabetes management.  Exercise, and detailed carbohydrates information provided  -  detailed on discharge instructions.  5) Thyroid nodule, incidentally found on imaging She was seen in ED in [place] for angina and had imaging identifying a thyroid nodule.  She did have nodule that warrants annual follow up, due around 11/2023.  6) Chronic Care/Health Maintenance: -she is on ACEI/ARB and Statin medications and is encouraged to initiate and continue to follow up with Ophthalmology, Dentist, Podiatrist at least yearly or according to recommendations, and advised to stay away from smoking. I have recommended yearly flu vaccine and pneumonia vaccine at least every 5 years; moderate intensity exercise for up to 150 minutes weekly; and sleep for at least 7 hours a day.  - she is advised to maintain close follow up with Wilmon Pali, FNP for primary care needs, as well as her other providers for optimal and coordinated care.      I spent  33  minutes in the care of the patient today including review of labs from CMP, Lipids, Thyroid Function, Hematology (current and previous including abstractions from other facilities); face-to-face time discussing  her blood glucose readings/logs, discussing hypoglycemia and hyperglycemia episodes and symptoms, medications doses, her options of short and long term treatment based on the latest standards of care / guidelines;  discussion about incorporating lifestyle medicine;  and documenting the encounter. Risk reduction counseling performed per USPSTF guidelines to reduce obesity and cardiovascular risk factors.     Please refer to Patient Instructions for Blood Glucose Monitoring and Insulin/Medications Dosing Guide"  in media tab for additional information. Please   also refer to " Patient Self Inventory" in the Media  tab for reviewed elements of pertinent patient history.  Abigail Jackson participated in the discussions, expressed understanding, and voiced agreement with the above plans.  All questions were answered to her satisfaction. she is encouraged to contact clinic should she have any questions or concerns prior to her return visit.     Follow up plan: - Return in about 3 months (around 05/22/2023) for Diabetes F/U with A1c in office, No previsit labs, Bring meter and logs.   Ronny Bacon, [facility] [address] [place], [place] [address] Phone: [phone] Fax: [phone]  02/19/2023, 8:26 AM

## 2023-03-13 IMAGING — US US RENAL
1 series · 14 of 25 positions shown · non-contrast
Comparison: CT from 12/05/2014.

CLINICAL DATA: Initial evaluation for stage IIIb chronic kidney
disease. History of previous partial left nephrectomy.

EXAM:
RENAL / URINARY TRACT ULTRASOUND COMPLETE

[Series 1: us renal · 0.23mm/px · 14 of 37 slices shown]
[im 1/37]
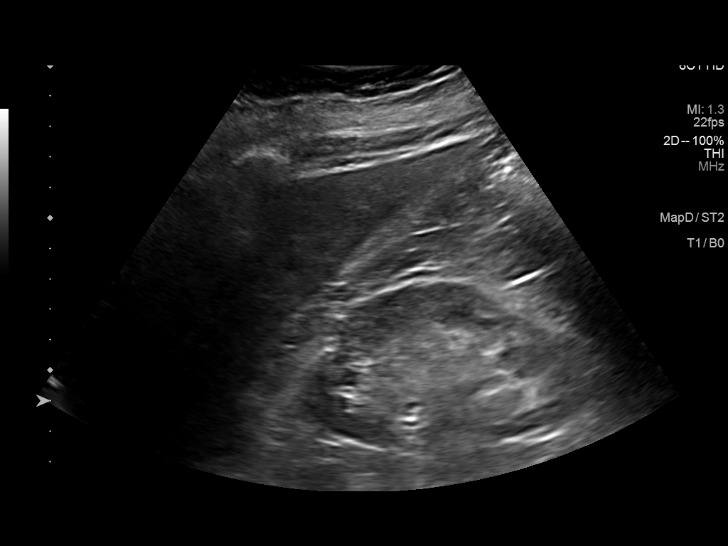
[im 4/37]
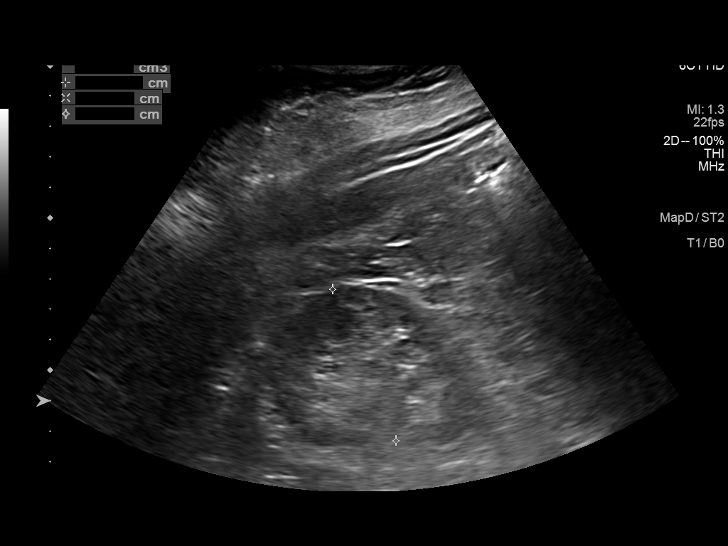
[im 7/37]
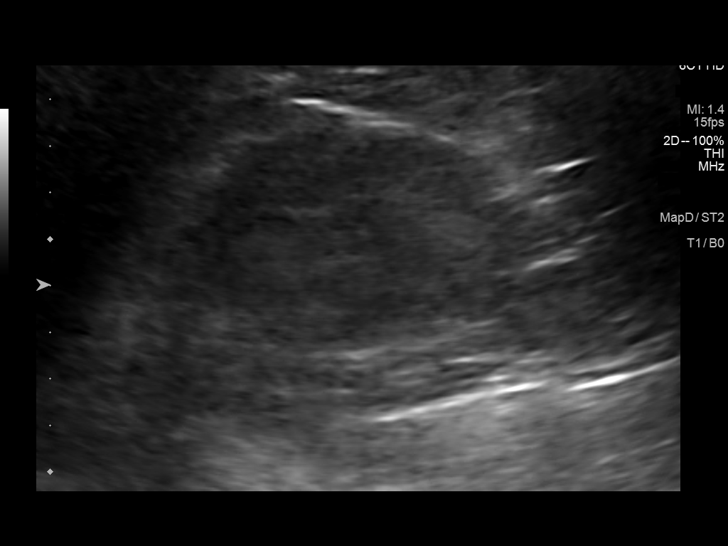
[im 10/37]
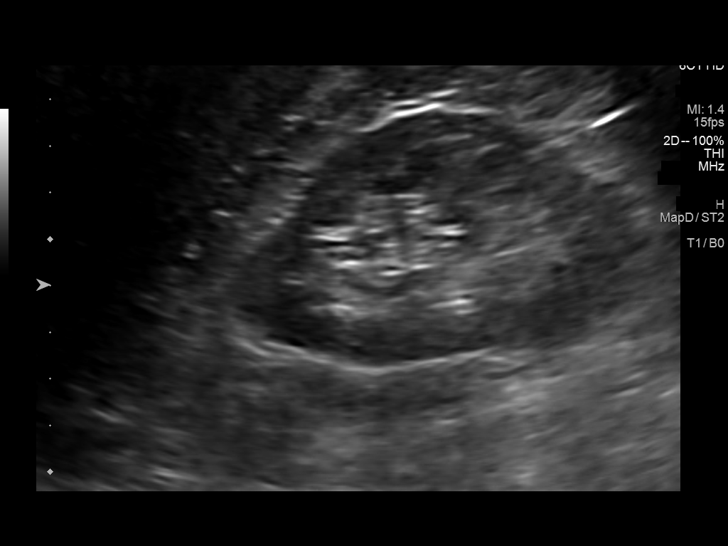
[im 13/37]
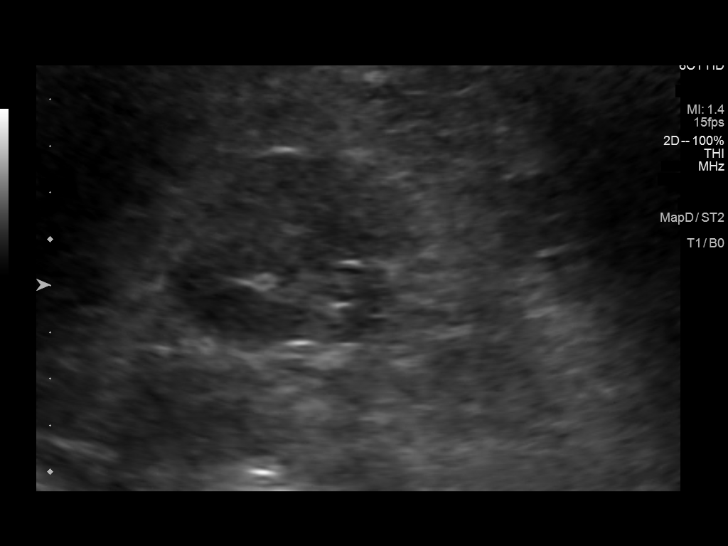
[im 14/37]
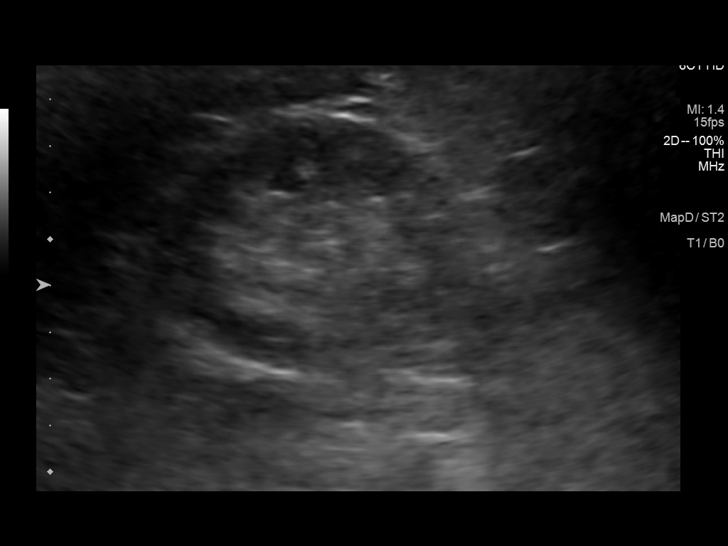
[im 17/37]
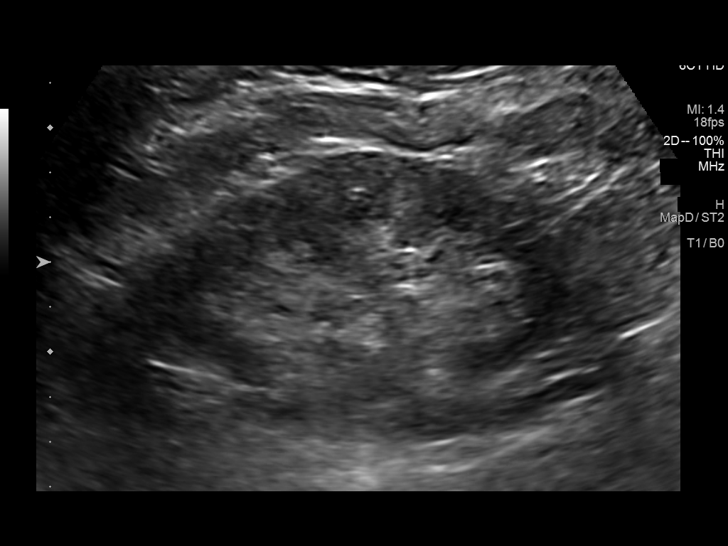
[im 20/37]
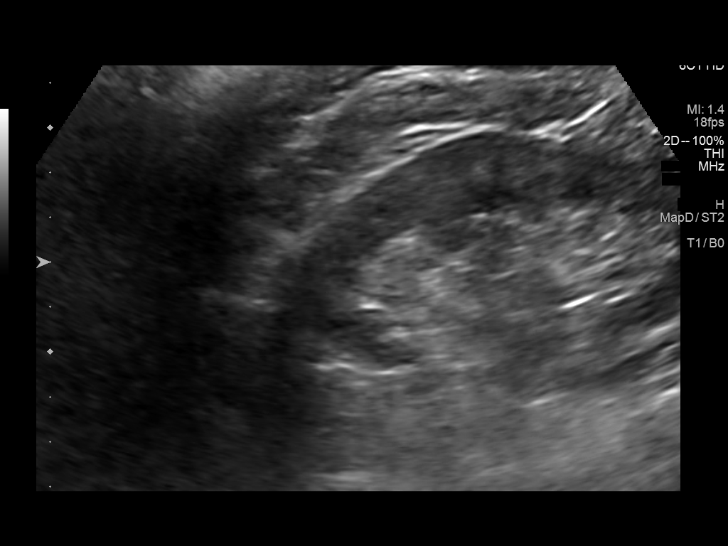
[im 23/37]
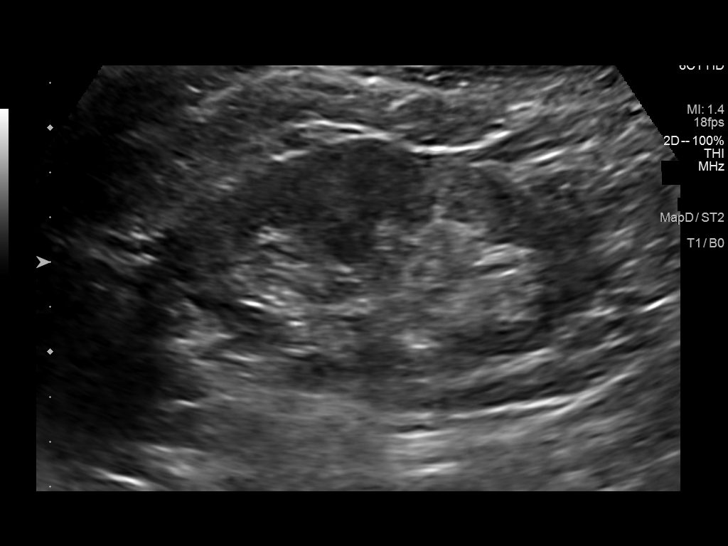
[im 25/37]
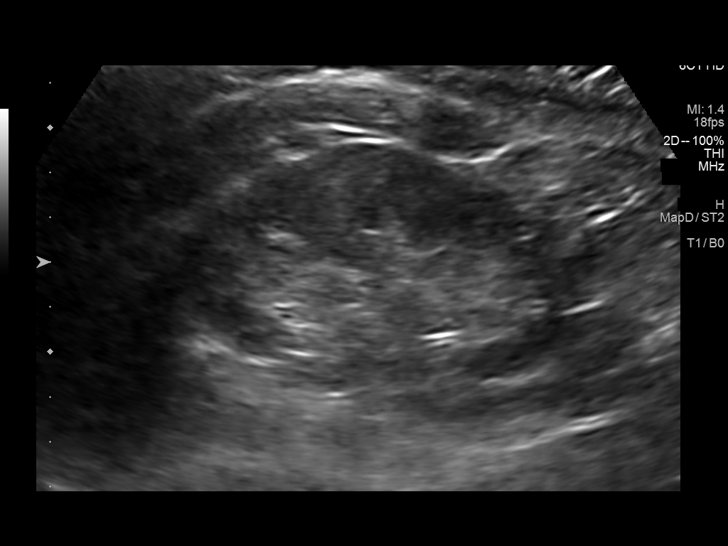
[im 28/37]
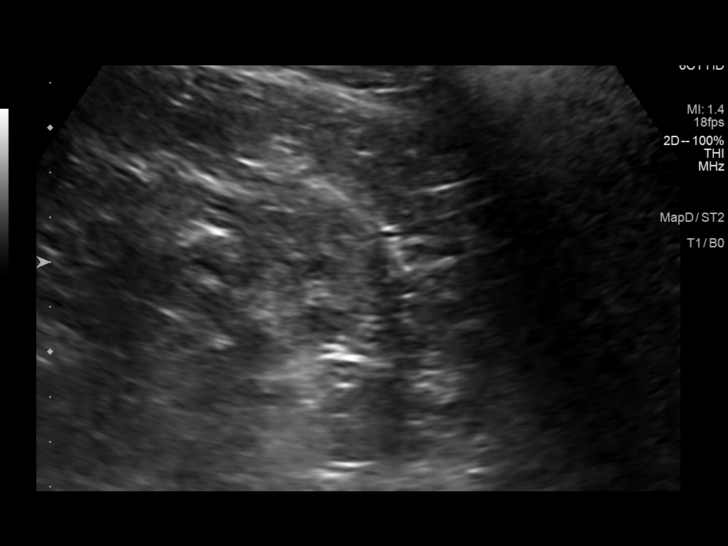
[im 31/37]
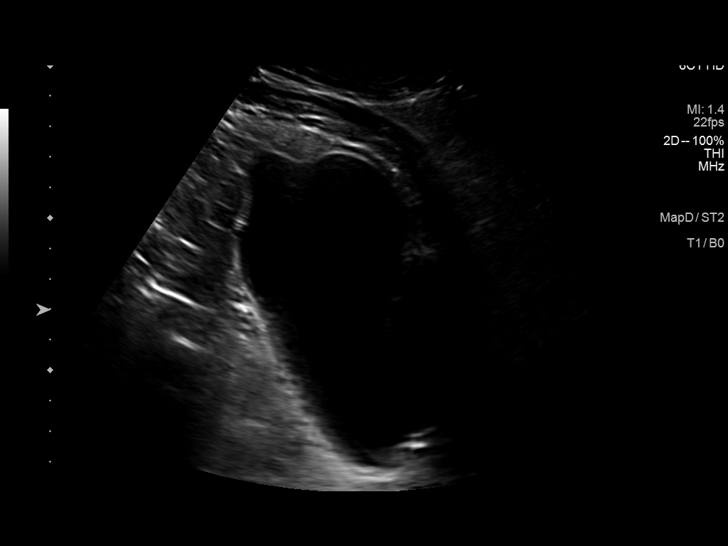
[im 34/37]
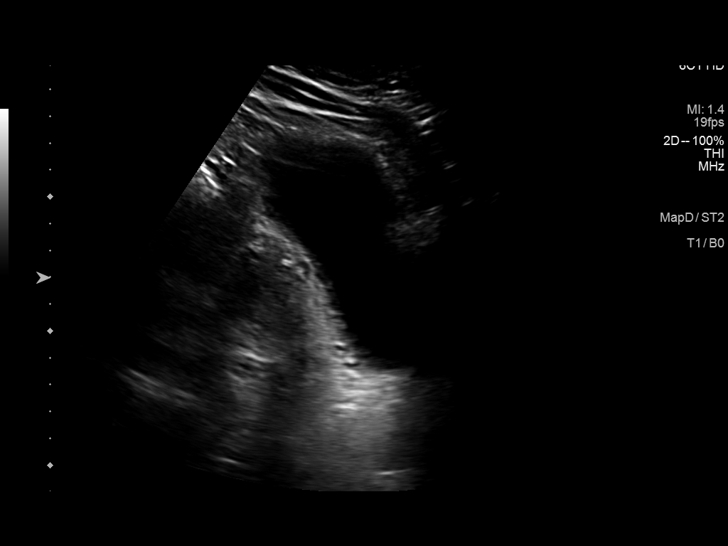
[im 37/37]
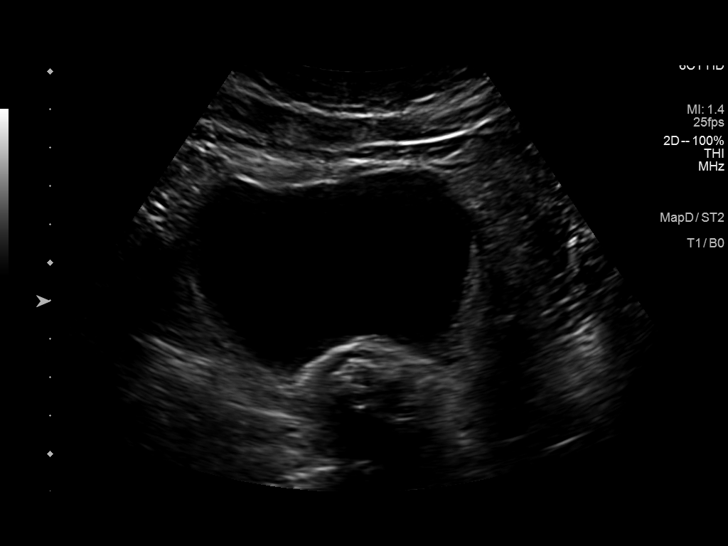

[14 of 25 positions shown; findings below may reference images not displayed]

FINDINGS: Right Kidney:

Renal measurements: 10.1 x 5.0 x 5.4 cm = volume: 142.8 mL.
Diffusely increased echogenicity within the renal parenchyma. No
nephrolithiasis or hydronephrosis. No focal renal mass.

Left Kidney:

Renal measurements: 9.8 x 5.1 x 4.2 cm = volume: 110.5 mL. Diffusely
increased echogenicity within the renal parenchyma. No
nephrolithiasis or hydronephrosis. No focal renal mass.

Bladder:

Appears normal for degree of bladder distention.

Other:

None.
IMPRESSION: 1. Diffusely increased echogenicity within the renal parenchyma,
compatible with medical renal disease.
2. No hydronephrosis or other significant finding.

## 2023-04-03 ENCOUNTER — Telehealth: Payer: Self-pay | Admitting: *Deleted

## 2023-04-03 NOTE — Telephone Encounter (Signed)
Patient has left a message on August 5 th asking for a call back, she had questions about her diabetes. Patient was called and a voicemail was left. I ask that she call our office back , leave a message with her questions and we would get that addressed and get back to her.

## 2023-04-14 ENCOUNTER — Other Ambulatory Visit: Payer: Self-pay | Admitting: Nurse Practitioner

## 2023-04-14 DIAGNOSIS — Z794 Long term (current) use of insulin: Secondary | ICD-10-CM

## 2023-04-15 ENCOUNTER — Other Ambulatory Visit: Payer: Self-pay | Admitting: Nurse Practitioner

## 2023-04-15 DIAGNOSIS — E1122 Type 2 diabetes mellitus with diabetic chronic kidney disease: Secondary | ICD-10-CM

## 2023-04-24 ENCOUNTER — Telehealth: Payer: Self-pay | Admitting: *Deleted

## 2023-04-24 NOTE — Telephone Encounter (Signed)
Returned the patient's call. She is concerned about her blood sugars going up when she is just setting or if she tries to do something. Patient states that she is connected to [place], this was printed and given to [place]. Patient also shares that she does skip meals. She is injecting [place] 70 units at night and Tradjenta 5 mg daily. Patient was advised that we would talk with her in the morning with Whitney's recommendation.

## 2023-04-25 ENCOUNTER — Other Ambulatory Visit: Payer: Self-pay | Admitting: *Deleted

## 2023-04-25 DIAGNOSIS — E1122 Type 2 diabetes mellitus with diabetic chronic kidney disease: Secondary | ICD-10-CM

## 2023-04-25 DIAGNOSIS — Z794 Long term (current) use of insulin: Secondary | ICD-10-CM

## 2023-04-25 MED ORDER — TRESIBA FLEXTOUCH 100 UNIT/ML ~~LOC~~ SOPN
85.0000 [IU] | PEN_INJECTOR | Freq: Every day | SUBCUTANEOUS | 0 refills | Status: DC
Start: 2023-04-25 — End: 2023-08-22

## 2023-04-25 NOTE — Telephone Encounter (Signed)
Patient was called and made aware. 

## 2023-04-25 NOTE — Telephone Encounter (Signed)
Please encourage her not to skip meals even if glucose is high.  When we skip meals it causes the liver to dump glucose into the blood stream for another energy source.  Have her increase her Tresiba to 85 units SQ nightly for now and see if it helps bring her glucose down further.  Continue the Tradjenta too.

## 2023-05-23 ENCOUNTER — Ambulatory Visit (INDEPENDENT_AMBULATORY_CARE_PROVIDER_SITE_OTHER): Payer: 59 | Admitting: Nurse Practitioner

## 2023-05-23 ENCOUNTER — Encounter: Payer: Self-pay | Admitting: Nurse Practitioner

## 2023-05-23 VITALS — BP 138/76 | HR 80 | Ht 63.0 in | Wt 252.4 lb

## 2023-05-23 DIAGNOSIS — E1122 Type 2 diabetes mellitus with diabetic chronic kidney disease: Secondary | ICD-10-CM | POA: Diagnosis not present

## 2023-05-23 DIAGNOSIS — E041 Nontoxic single thyroid nodule: Secondary | ICD-10-CM

## 2023-05-23 DIAGNOSIS — Z7984 Long term (current) use of oral hypoglycemic drugs: Secondary | ICD-10-CM

## 2023-05-23 DIAGNOSIS — N1832 Chronic kidney disease, stage 3b: Secondary | ICD-10-CM

## 2023-05-23 DIAGNOSIS — E782 Mixed hyperlipidemia: Secondary | ICD-10-CM

## 2023-05-23 DIAGNOSIS — I10 Essential (primary) hypertension: Secondary | ICD-10-CM | POA: Diagnosis not present

## 2023-05-23 DIAGNOSIS — Z794 Long term (current) use of insulin: Secondary | ICD-10-CM | POA: Diagnosis not present

## 2023-05-23 LAB — POCT GLYCOSYLATED HEMOGLOBIN (HGB A1C): Hemoglobin A1C: 8.9 % — AB (ref 4.0–5.6)

## 2023-05-23 NOTE — Progress Notes (Signed)
Endocrinology Follow Up Note       05/23/2023, 8:26 AM   Subjective:    Patient ID: Abigail Jackson, female    DOB: 1966/03/31.  Abigail Jackson is being seen in follow up after being seen in follow up after being seen in consultation for management of currently uncontrolled symptomatic diabetes requested by  Wilmon Pali, FNP.   Past Medical History:  Diagnosis Date   Arthritis    Diabetes mellitus without complication (HCC)    Heart murmur    Hyperlipidemia    Hypertension    Irregular bowel habits    Renal neoplasm    LEFT    Past Surgical History:  Procedure Laterality Date   ROBOTIC ASSITED PARTIAL NEPHRECTOMY Left 08/03/2015   Procedure: ROBOTIC ASSISTED PARTIAL LEFT NEPHRECTOMY;  Surgeon: Heloise Purpura, MD;  Location: [facility];  Service: Urology;  Laterality: Left;   SHOULDER SURGERY  2006   RIGHT    Social History   Socioeconomic History   Marital status: Married    Spouse name: Not on file   Number of children: Not on file   Years of education: Not on file   Highest education level: Not on file  Occupational History   Not on file  Tobacco Use   Smoking status: Never   Smokeless tobacco: Never  Vaping Use   Vaping status: Never Used  Substance and Sexual Activity   Alcohol use: No   Drug use: No   Sexual activity: Not on file  Other Topics Concern   Not on file  Social History Narrative   Not on file   Social Determinants of Health   Financial Resource Strain: Not on file  Food Insecurity: Not on file  Transportation Needs: Not on file  Physical Activity: Not on file  Stress: Not on file  Social Connections: Not on file    Family History  Problem Relation Age of Onset   Diabetes Mother    Hypertension Mother    Diabetes Father    Hypertension Father    Thyroid disease Father    Hyperlipidemia Father    Kidney disease Father    Heart failure Father      Outpatient Encounter Medications as of 05/23/2023  Medication Sig   acetaminophen (TYLENOL) 325 MG tablet Take by mouth.   amitriptyline (ELAVIL) 100 MG tablet Take 100 mg by mouth at bedtime.   amLODipine (NORVASC) 5 MG tablet Take 1 tablet by mouth daily.   aspirin 81 MG EC tablet 1 tablet   cholecalciferol (VITAMIN D) 400 units TABS tablet Take 400 Units by mouth.   dicyclomine (BENTYL) 10 MG capsule Take 1 capsule by mouth 2 (two) times daily.   Easy Touch Lancets 30G MISC USE AS DIRECTED TO TEST BLOOD GLUCOSE FOUR TIMES A DAY   EASY TOUCH PEN NEEDLES 31G X 8 MM MISC USE WITH INSULIN PEN AT BEDTIME   ezetimibe (ZETIA) 10 MG tablet Take 1 tablet by mouth daily.   fenofibrate (TRICOR) 145 MG tablet Take 145 mg by mouth daily.   glucose blood test strip 1 each by Other route 4 (four) times daily. Use as instructed 4 x daily. E11.65  insulin degludec (TRESIBA FLEXTOUCH) 100 UNIT/ML FlexTouch Pen Inject 85 Units into the skin at bedtime.   metoprolol succinate (TOPROL-XL) 50 MG 24 hr tablet Take 1 tablet (50 mg total) by mouth daily. Take with or immediately following a meal. (Patient taking differently: Take 50 mg by mouth 2 (two) times daily. Take with or immediately following a meal.)   metoprolol tartrate (LOPRESSOR) 50 MG tablet Take 50 mg by mouth daily.   omeprazole (PRILOSEC) 40 MG capsule Take 1 capsule (40 mg total) by mouth at bedtime.   oxyCODONE (OXY IR/ROXICODONE) 5 MG immediate release tablet Take by mouth.   pregabalin (LYRICA) 50 MG capsule Take 50 mg by mouth 2 (two) times daily.   TRADJENTA 5 MG TABS tablet Take 1 tablet (5 mg total) by mouth daily.   cephALEXin (KEFLEX) 500 MG capsule Take 500 mg by mouth 2 (two) times daily. (Patient not taking: Reported on 05/23/2023)   No facility-administered encounter medications on file as of 05/23/2023.    ALLERGIES: Allergies  Allergen Reactions   Tramadol Itching and Rash    VACCINATION STATUS:  There is no  immunization history on file for this patient.  Diabetes She presents for her follow-up diabetic visit. She has type 2 diabetes mellitus. Onset time: Diagnosed at approx age of 51. Her disease course has been worsening. There are no hypoglycemic associated symptoms. Associated symptoms include fatigue. There are no hypoglycemic complications. Symptoms are stable. Diabetic complications include nephropathy. Risk factors for coronary artery disease include diabetes mellitus, dyslipidemia, family history, obesity, hypertension and sedentary lifestyle. Current diabetic treatment includes insulin injections and oral agent (monotherapy). She is compliant with treatment most of the time. Her weight is increasing steadily. She is following a generally healthy diet. When asked about meal planning, she reported none. She has not had a previous visit with a dietitian. She rarely participates in exercise. Her home blood glucose trend is increasing steadily. Her overall blood glucose range is >200 mg/dl. (She presents today with her CGM showing above target glycemic profile overall.  Her POCT A1c today is 8.9%, increasing from last visit of 8.3%.  She notes she has just finished antibiotics for upper respiratory infection and is still using cough drops/syrups to help with the symptoms likely contributing to her higher afternoon readings.  Analysis of her CGM shows TIR 31%, TAR 69%, TBR 0% with a GMI of 8.6%.  She denies any significant hypoglycemia.) An ACE inhibitor/angiotensin II receptor blocker is being taken. She does not see a podiatrist.Eye exam is current.  Hyperlipidemia This is a chronic problem. The current episode started more than 1 year ago. The problem is uncontrolled. Recent lipid tests were reviewed and are high. Exacerbating diseases include chronic renal disease, diabetes and obesity. Factors aggravating her hyperlipidemia include beta blockers, thiazides and fatty foods. Current antihyperlipidemic  treatment includes statins. The current treatment provides moderate improvement of lipids. Compliance problems include adherence to diet and adherence to exercise.  Risk factors for coronary artery disease include diabetes mellitus, dyslipidemia, family history, hypertension, obesity and a sedentary lifestyle.  Hypertension This is a chronic problem. The current episode started more than 1 year ago. The problem has been waxing and waning since onset. The problem is uncontrolled. There are no associated agents to hypertension. Risk factors for coronary artery disease include diabetes mellitus, dyslipidemia, family history, obesity and sedentary lifestyle. Past treatments include beta blockers, diuretics and ACE inhibitors. The current treatment provides moderate improvement. There are no compliance problems.  Hypertensive  end-organ damage includes kidney disease. with hx of left nephrectomy. Identifiable causes of hypertension include chronic renal disease and sleep apnea.     Review of systems  Constitutional: + increasing body weight,  current Body mass index is 44.71 kg/m. , + fatigue, no subjective hyperthermia, no subjective hypothermia Eyes: no blurry vision, no xerophthalmia ENT: no sore throat, no nodules palpated in throat, no dysphagia/odynophagia, no hoarseness Cardiovascular: no chest pain, no shortness of breath, no palpitations, no leg swelling Respiratory: no cough, no shortness of breath Gastrointestinal: no nausea/vomiting/diarrhea Musculoskeletal: c/o intermittent back pain- walks with walker/cane Skin: no rashes, no hyperemia Neurological: no tremors, no numbness, no tingling, no dizziness Psychiatric: no depression, no anxiety  Objective:     BP 138/76 (BP Location: Left Arm, Patient Position: Sitting, Cuff Size: Large)   Pulse 80   Ht 5\' 3"  (1.6 m)   Wt 252 lb 6.4 oz (114.5 kg)   BMI 44.71 kg/m   Wt Readings from Last 3 Encounters:  05/23/23 252 lb 6.4 oz (114.5 kg)   02/19/23 250 lb 12.8 oz (113.8 kg)  11/19/22 248 lb 6.4 oz (112.7 kg)     BP Readings from Last 3 Encounters:  05/23/23 138/76  02/19/23 118/71  11/19/22 135/81     Physical Exam- Limited  Constitutional:  Body mass index is 44.71 kg/m., not in acute distress, normal state of mind Eyes:  EOMI, no exophthalmos Musculoskeletal: no gross deformities, strength intact in all four extremities, no gross restriction of joint movements, walks with walker/cane Skin:  no rashes, no hyperemia Neurological: no tremor with outstretched hands    Diabetic Foot Exam - Simple   No data filed      CMP ( most recent) CMP     Component Value Date/Time   NA 140 02/05/2022 0000   K 4.6 02/05/2022 0000   CL 104 02/05/2022 0000   CO2 28 (A) 02/05/2022 0000   GLUCOSE 130 (H) 09/03/2021 0807   BUN 20 02/14/2023 0000   CREATININE 1.4 (A) 02/14/2023 0000   CREATININE 1.44 (H) 09/03/2021 0807   CALCIUM 10.4 02/05/2022 0000   PROT 6.9 09/03/2021 0807   ALBUMIN 4.2 02/05/2022 0000   AST 23 02/05/2022 0000   ALT 23 02/05/2022 0000   ALKPHOS 43 02/05/2022 0000   BILITOT 0.4 09/03/2021 0807   GFRNONAA 41 (L) 04/13/2018 1113   GFRAA 39 12/26/2020 0000   GFRAA 47 (L) 04/13/2018 1113     Diabetic Labs (most recent): Lab Results  Component Value Date   HGBA1C 8.9 (A) 05/23/2023   HGBA1C 8.3 02/14/2023   HGBA1C 9.0 (A) 11/19/2022   MICROALBUR 0.5 02/05/2022   MICROALBUR 1.7 12/10/2017     Lipid Panel ( most recent) Lipid Panel     Component Value Date/Time   CHOL 216 (A) 02/05/2022 0000   TRIG 184 (A) 02/14/2023 0000   HDL 45 02/05/2022 0000   CHOLHDL 4.2 09/03/2021 0807   LDLCALC 148 02/14/2023 0000   LDLCALC 159 (H) 09/03/2021 0807      Lab Results  Component Value Date   TSH 1.31 02/14/2023   TSH 1.41 02/05/2022   TSH 2.44 09/03/2021   TSH 1.96 08/30/2020   TSH 2.547 08/06/2015   FREET4 1.1 09/03/2021           Assessment & Plan:   1) Uncontrolled Type 2  Diabetes with kidney complications:  She presents today with her CGM showing above target glycemic profile overall.  Her POCT A1c today  is 8.9%, increasing from last visit of 8.3%.  She notes she has just finished antibiotics for upper respiratory infection and is still using cough drops/syrups to help with the symptoms likely contributing to her higher afternoon readings.  Analysis of her CGM shows TIR 31%, TAR 69%, TBR 0% with a GMI of 8.6%.  She denies any significant hypoglycemia.    - Abigail Jackson has currently uncontrolled symptomatic type 2 DM since 57 years of age.   -Recent labs reviewed.  - I had a long discussion with her about the progressive nature of diabetes and the pathology behind its complications. -her diabetes is complicated by CKD 3 and she remains at a high risk for more acute and chronic complications which include CAD, CVA, CKD, retinopathy, and neuropathy. These are all discussed in detail with her.  - Nutritional counseling repeated at each appointment due to patients tendency to fall back in to old habits.  - The patient admits there is a room for improvement in their diet and drink choices. -  Suggestion is made for the patient to avoid simple carbohydrates from their diet including Cakes, Sweet Desserts / Pastries, Ice Cream, Soda (diet and regular), Sweet Tea, Candies, Chips, Cookies, Sweet Pastries, Store Bought Juices, Alcohol in Excess of 1-2 drinks a day, Artificial Sweeteners, Coffee Creamer, and "Sugar-free" Products. This will help patient to have stable blood glucose profile and potentially avoid unintended weight gain.   - I encouraged the patient to switch to unprocessed or minimally processed complex starch and increased protein intake (animal or plant source), fruits, and vegetables.   - Patient is advised to stick to a routine mealtimes to eat 3 meals a day and avoid unnecessary snacks (to snack only to correct hypoglycemia).  - she will be scheduled  with Norm Salt, RDN, CDE for diabetes education.  - I have approached her with the following individualized plan to manage her diabetes and patient agrees:   -She is advised to continue Tresiba 85 units SQ nightly and Tradjenta 5 mg po daily.   We discussed possibility of changing her to premixed insulin to get the added benefit of some short-acting insulin in the future.  She expressed her interest in Omnipod in the future, which I think we can avoid at this time.  -She is encouraged to continue monitoring blood glucose twice daily (using her CGM), before breakfast and before bed, and to call the clinic if she has readings less than 70 or greater than 300 for 3 tests in a row.  She is benefiting from CGM device (from [employer]).  Will recommend she stay on this device.  - she is warned not to take insulin without proper monitoring per orders. - Adjustment parameters are given to her for hypo and hyperglycemia in writing.  - she is not a candidate for Metformin due to concurrent renal insufficiency.  - Specific targets for  A1c; LDL, HDL, and Triglycerides were discussed with the patient.  2) Blood Pressure /Hypertension:  her blood pressure is controlled to target.   she is advised to continue her current medications including HCTZ 25 mg po daily, Lisinopril 5 mg p.o. daily, and Metoprolol 50 mg po twice daily with breakfast.   3) Lipids/Hyperlipidemia:    Review of her recent lipid panel from 02/14/23 showed uncontrolled LDL at 148 .  she is advised to continue Lipitor 20 mg daily at bedtime and Tricor 145 mg po daily.  Side effects and precautions discussed with her.  4)  Weight/Diet:  her Body mass index is 44.71 kg/m.  -  clearly complicating her diabetes care.   she is a candidate for weight loss. I discussed with her the fact that loss of 5 - 10% of her  current body weight will have the most impact on her diabetes management.  Exercise, and detailed carbohydrates information  provided  -  detailed on discharge instructions.  5) Thyroid nodule, incidentally found on imaging She was seen in ED in [place] for angina and had imaging identifying a thyroid nodule.  She did have nodule that warrants annual follow up, due around 11/2023.  6) Chronic Care/Health Maintenance: -she is on ACEI/ARB and Statin medications and is encouraged to initiate and continue to follow up with Ophthalmology, Dentist, Podiatrist at least yearly or according to recommendations, and advised to stay away from smoking. I have recommended yearly flu vaccine and pneumonia vaccine at least every 5 years; moderate intensity exercise for up to 150 minutes weekly; and sleep for at least 7 hours a day.  - she is advised to maintain close follow up with Wilmon Pali, FNP for primary care needs, as well as her other providers for optimal and coordinated care.      I spent  39  minutes in the care of the patient today including review of labs from CMP, Lipids, Thyroid Function, Hematology (current and previous including abstractions from other facilities); face-to-face time discussing  her blood glucose readings/logs, discussing hypoglycemia and hyperglycemia episodes and symptoms, medications doses, her options of short and long term treatment based on the latest standards of care / guidelines;  discussion about incorporating lifestyle medicine;  and documenting the encounter. Risk reduction counseling performed per USPSTF guidelines to reduce obesity and cardiovascular risk factors.     Please refer to Patient Instructions for Blood Glucose Monitoring and Insulin/Medications Dosing Guide"  in media tab for additional information. Please  also refer to " Patient Self Inventory" in the Media  tab for reviewed elements of pertinent patient history.  Abigail Jackson participated in the discussions, expressed understanding, and voiced agreement with the above plans.  All questions were answered to her  satisfaction. she is encouraged to contact clinic should she have any questions or concerns prior to her return visit.     Follow up plan: - Return in about 3 months (around 08/22/2023) for Diabetes F/U with A1c in office, No previsit labs, Bring meter and logs.   Abigail Jackson, [facility] [address] [place], [place] [address] Phone: [phone] Fax: [phone]  05/23/2023, 8:26 AM

## 2023-05-27 ENCOUNTER — Other Ambulatory Visit: Payer: Self-pay | Admitting: *Deleted

## 2023-05-27 DIAGNOSIS — N1832 Chronic kidney disease, stage 3b: Secondary | ICD-10-CM

## 2023-05-27 DIAGNOSIS — Z794 Long term (current) use of insulin: Secondary | ICD-10-CM

## 2023-05-27 DIAGNOSIS — E1122 Type 2 diabetes mellitus with diabetic chronic kidney disease: Secondary | ICD-10-CM

## 2023-05-27 MED ORDER — GLUCOSE BLOOD VI STRP
ORAL_STRIP | 12 refills | Status: DC
Start: 2023-05-27 — End: 2023-06-04

## 2023-05-28 ENCOUNTER — Other Ambulatory Visit: Payer: Self-pay

## 2023-06-03 ENCOUNTER — Other Ambulatory Visit: Payer: Self-pay | Admitting: Nurse Practitioner

## 2023-06-03 DIAGNOSIS — Z794 Long term (current) use of insulin: Secondary | ICD-10-CM

## 2023-06-18 ENCOUNTER — Other Ambulatory Visit: Payer: Self-pay

## 2023-06-18 DIAGNOSIS — N1832 Chronic kidney disease, stage 3b: Secondary | ICD-10-CM

## 2023-06-18 MED ORDER — EASY TOUCH PEN NEEDLES 31G X 8 MM MISC
2 refills | Status: DC
Start: 2023-06-18 — End: 2023-06-19

## 2023-06-19 ENCOUNTER — Other Ambulatory Visit: Payer: Self-pay

## 2023-06-19 DIAGNOSIS — E1122 Type 2 diabetes mellitus with diabetic chronic kidney disease: Secondary | ICD-10-CM

## 2023-06-19 MED ORDER — EASY TOUCH PEN NEEDLES 31G X 8 MM MISC: 2 refills | Status: AC

## 2023-06-19 MED ORDER — TRESIBA FLEXTOUCH 200 UNIT/ML ~~LOC~~ SOPN: 85.0000 [IU] | PEN_INJECTOR | Freq: Every day | SUBCUTANEOUS | 0 refills | Status: DC

## 2023-06-19 NOTE — Progress Notes (Signed)
Sent in [place] 200units/mL to pharmacy so pt does not have to inject twice with the 100units/mL. Pt is taking 85 units qhs

## 2023-07-03 ENCOUNTER — Other Ambulatory Visit: Payer: Self-pay | Admitting: *Deleted

## 2023-07-03 ENCOUNTER — Telehealth: Payer: Self-pay | Admitting: *Deleted

## 2023-07-03 DIAGNOSIS — Z7984 Long term (current) use of oral hypoglycemic drugs: Secondary | ICD-10-CM

## 2023-07-03 DIAGNOSIS — Z794 Long term (current) use of insulin: Secondary | ICD-10-CM

## 2023-07-03 MED ORDER — SEMAGLUTIDE(0.25 OR 0.5MG/DOS) 2 MG/3ML ~~LOC~~ SOPN: PEN_INJECTOR | SUBCUTANEOUS | 3 refills | Status: DC

## 2023-07-03 NOTE — Telephone Encounter (Signed)
Patient states  in a message ,that her Kidney doctor wanted her to ask if she could start on Ozempic? She left the number for the Kidney Doctor,  Dr.Ryan [place], [phone]. I called the patient back and ask her if there was a reason that he wanted to,do this and she was not sure.

## 2023-07-03 NOTE — Telephone Encounter (Signed)
I am not opposed to this.  I think it would help her lower her insulin in the long run.  She will have to stop the Tradjenta to be able to start the Ozempic as they are similar medications.  Is she ok with this?  If so, she will stop the Tradjenta and start Ozempic 0.25 mg SQ weekly x 2 weeks, then increase to 0.5 mg SQ weekly thereafter.  Side effects to watch out for would be nausea, vomiting, constipation, diarrhea, gas, bloating, belching.  These symptoms are typically worse if she eats too much in one sitting or if she eats high fat foods (fried foods, red meats, etc).

## 2023-07-03 NOTE — Telephone Encounter (Signed)
Patient was called and made aware. She is wanting to try the Ozempic. I reviewed Whitney Reardon's recommendations with her and explained to her that she would have to stop the Trajenta when she started the Ozempic. A prescription was sent in to [facility] per the patient's request.

## 2023-07-18 ENCOUNTER — Telehealth: Payer: Self-pay | Admitting: *Deleted

## 2023-07-18 ENCOUNTER — Other Ambulatory Visit: Payer: Self-pay | Admitting: *Deleted

## 2023-07-18 NOTE — Telephone Encounter (Signed)
Patient had left some messages that the pharmacy needed for [place] to clarify what dose of the Ozempic she was to inject. Then, they needed a new prescription . I called the pharmacy. The pharmacy tech shared that it had been delivered on 07/12/23 at 3:30 pm.I called the patient and she told me that she had it and wondered how to take it. Patient had been made aware that she was to inject 0.25 mg weekly for 2 weeks and if tolerated well she would then inject 0.5 mg weekly. She verified that the instructions read this on her box. When patient was ask how had she been taking she said, I have been injecting 0.25 mg 11/17,11/18,11/19,11/20 and 11/21. She said that she had 1 needle left,1 more dose. Talked with Whitney and she advises that the patient stop the injections, when she gets her new pen, to contact our office for appointment to come in for a nurse visit, with her new pen for teaching.  Patient verbalized understanding. She denies any side effects.

## 2023-08-07 ENCOUNTER — Other Ambulatory Visit: Payer: Self-pay | Admitting: *Deleted

## 2023-08-07 DIAGNOSIS — E1122 Type 2 diabetes mellitus with diabetic chronic kidney disease: Secondary | ICD-10-CM

## 2023-08-07 DIAGNOSIS — Z794 Long term (current) use of insulin: Secondary | ICD-10-CM

## 2023-08-07 DIAGNOSIS — N1832 Chronic kidney disease, stage 3b: Secondary | ICD-10-CM

## 2023-08-07 MED ORDER — NOVOFINE PEN NEEDLE 32G X 6 MM MISC: 1.0000 | Freq: Every day | 3 refills | Status: DC

## 2023-08-22 ENCOUNTER — Encounter: Payer: Self-pay | Admitting: Nurse Practitioner

## 2023-08-22 ENCOUNTER — Ambulatory Visit (INDEPENDENT_AMBULATORY_CARE_PROVIDER_SITE_OTHER): Payer: 59 | Admitting: Nurse Practitioner

## 2023-08-22 VITALS — BP 138/76 | HR 81 | Ht 63.0 in | Wt 251.8 lb

## 2023-08-22 DIAGNOSIS — I10 Essential (primary) hypertension: Secondary | ICD-10-CM | POA: Diagnosis not present

## 2023-08-22 DIAGNOSIS — E782 Mixed hyperlipidemia: Secondary | ICD-10-CM

## 2023-08-22 DIAGNOSIS — Z7984 Long term (current) use of oral hypoglycemic drugs: Secondary | ICD-10-CM

## 2023-08-22 DIAGNOSIS — Z794 Long term (current) use of insulin: Secondary | ICD-10-CM | POA: Diagnosis not present

## 2023-08-22 DIAGNOSIS — E1122 Type 2 diabetes mellitus with diabetic chronic kidney disease: Secondary | ICD-10-CM | POA: Diagnosis not present

## 2023-08-22 DIAGNOSIS — N1832 Chronic kidney disease, stage 3b: Secondary | ICD-10-CM

## 2023-08-22 DIAGNOSIS — E041 Nontoxic single thyroid nodule: Secondary | ICD-10-CM

## 2023-08-22 LAB — POCT GLYCOSYLATED HEMOGLOBIN (HGB A1C): Hemoglobin A1C: 8.7 % — AB (ref 4.0–5.6)

## 2023-08-22 MED ORDER — TRESIBA FLEXTOUCH 200 UNIT/ML ~~LOC~~ SOPN: 70.0000 [IU] | PEN_INJECTOR | Freq: Every day | SUBCUTANEOUS | 1 refills | Status: DC

## 2023-08-22 NOTE — Progress Notes (Signed)
Endocrinology Follow Up Note       08/22/2023, 8:31 AM   Subjective:    Patient ID: Abigail Jackson, female    DOB: 1965/09/18.  Abigail Jackson is being seen in follow up after being seen in follow up after being seen in consultation for management of currently uncontrolled symptomatic diabetes requested by  Wilmon Pali, FNP.   Past Medical History:  Diagnosis Date   Arthritis    Diabetes mellitus without complication (HCC)    Heart murmur    Hyperlipidemia    Hypertension    Irregular bowel habits    Renal neoplasm    LEFT    Past Surgical History:  Procedure Laterality Date   ROBOTIC ASSITED PARTIAL NEPHRECTOMY Left 08/03/2015   Procedure: ROBOTIC ASSISTED PARTIAL LEFT NEPHRECTOMY;  Surgeon: Heloise Purpura, MD;  Location: [facility];  Service: Urology;  Laterality: Left;   SHOULDER SURGERY  2006   RIGHT    Social History   Socioeconomic History   Marital status: Married    Spouse name: Not on file   Number of children: Not on file   Years of education: Not on file   Highest education level: Not on file  Occupational History   Not on file  Tobacco Use   Smoking status: Never   Smokeless tobacco: Never  Vaping Use   Vaping status: Never Used  Substance and Sexual Activity   Alcohol use: No   Drug use: No   Sexual activity: Not on file  Other Topics Concern   Not on file  Social History Narrative   Not on file   Social Drivers of Health   Financial Resource Strain: Not on file  Food Insecurity: Not on file  Transportation Needs: Not on file  Physical Activity: Not on file  Stress: Not on file  Social Connections: Not on file    Family History  Problem Relation Age of Onset   Diabetes Mother    Hypertension Mother    Diabetes Father    Hypertension Father    Thyroid disease Father    Hyperlipidemia Father    Kidney disease Father    Heart failure Father     Outpatient  Encounter Medications as of 08/22/2023  Medication Sig   acetaminophen (TYLENOL) 325 MG tablet Take by mouth.   amitriptyline (ELAVIL) 100 MG tablet Take 100 mg by mouth at bedtime.   amLODipine (NORVASC) 5 MG tablet Take 1 tablet by mouth daily.   aspirin 81 MG EC tablet 1 tablet   cholecalciferol (VITAMIN D) 400 units TABS tablet Take 400 Units by mouth.   dicyclomine (BENTYL) 10 MG capsule Take 1 capsule by mouth 2 (two) times daily.   Easy Touch Lancets 30G MISC USE AS DIRECTED TO TEST BLOOD GLUCOSE FOUR TIMES A DAY   ezetimibe (ZETIA) 10 MG tablet Take 1 tablet by mouth daily.   fenofibrate (TRICOR) 145 MG tablet Take 145 mg by mouth daily.   Insulin Pen Needle (EASY TOUCH PEN NEEDLES) 31G X 8 MM MISC Use with insulin bid. E11.65   Insulin Pen Needle (NOVOFINE PEN NEEDLE) 32G X 6 MM MISC 1 Box by Does not apply route  at bedtime.   metoprolol succinate (TOPROL-XL) 50 MG 24 hr tablet Take 1 tablet (50 mg total) by mouth daily. Take with or immediately following a meal. (Patient taking differently: Take 50 mg by mouth 2 (two) times daily. Take with or immediately following a meal.)   metoprolol tartrate (LOPRESSOR) 50 MG tablet Take 50 mg by mouth daily.   omeprazole (PRILOSEC) 40 MG capsule Take 1 capsule (40 mg total) by mouth at bedtime.   ONETOUCH ULTRA test strip USE TO TEST BLOOD SUGAR FOUR TIMES A DAY AS DIRECTED   oxyCODONE (OXY IR/ROXICODONE) 5 MG immediate release tablet Take by mouth.   pregabalin (LYRICA) 50 MG capsule Take 50 mg by mouth 2 (two) times daily.   [DISCONTINUED] TRADJENTA 5 MG TABS tablet Take 1 tablet (5 mg total) by mouth daily.   insulin degludec (TRESIBA FLEXTOUCH) 200 UNIT/ML FlexTouch Pen Inject 70 Units into the skin at bedtime.   Semaglutide,0.25 or 0.5MG /DOS, 2 MG/3ML SOPN Patient is to inject 0.25 for 2 weeks, then patient is to inject 0.5 mg weekly (Patient not taking: Reported on 08/22/2023)   [DISCONTINUED] cephALEXin (KEFLEX) 500 MG capsule Take 500 mg  by mouth 2 (two) times daily. (Patient not taking: Reported on 08/22/2023)   [DISCONTINUED] insulin degludec (TRESIBA FLEXTOUCH) 100 UNIT/ML FlexTouch Pen Inject 85 Units into the skin at bedtime. (Patient not taking: Reported on 08/22/2023)   [DISCONTINUED] insulin degludec (TRESIBA FLEXTOUCH) 200 UNIT/ML FlexTouch Pen Inject 86 Units into the skin at bedtime. (Patient not taking: Reported on 08/22/2023)   No facility-administered encounter medications on file as of 08/22/2023.    ALLERGIES: Allergies  Allergen Reactions   Tramadol Itching and Rash    VACCINATION STATUS:  There is no immunization history on file for this patient.  Diabetes She presents for her follow-up diabetic visit. She has type 2 diabetes mellitus. Onset time: Diagnosed at approx age of 38. Her disease course has been fluctuating. There are no hypoglycemic associated symptoms. Associated symptoms include fatigue. There are no hypoglycemic complications. Symptoms are stable. Diabetic complications include nephropathy. Risk factors for coronary artery disease include diabetes mellitus, dyslipidemia, family history, obesity, hypertension and sedentary lifestyle. Current diabetic treatment includes insulin injections and oral agent (monotherapy). She is compliant with treatment most of the time. Her weight is increasing steadily. She is following a generally healthy diet. When asked about meal planning, she reported none. She has not had a previous visit with a dietitian. She rarely participates in exercise. Her home blood glucose trend is increasing steadily. Her overall blood glucose range is >200 mg/dl. (She presents today, accompanied by her daughter, with her CGM showing above target glycemic profile overall.  Her POCT A1c today is 8.7%, improving slightly from last visit of 8.9%.  She was prescribed Ozempic last visit but misunderstood the directions and was taking it every day with her Tresiba insulin.  Taking this much did  cause significant gas and bloating.  She did stop her Tradjenta though.  Analysis of her CGM shows TIR 22%, TAR 77%, TBR 1% with a GMI of 9%.) An ACE inhibitor/angiotensin II receptor blocker is being taken. She does not see a podiatrist.Eye exam is current.  Hyperlipidemia This is a chronic problem. The current episode started more than 1 year ago. The problem is uncontrolled. Recent lipid tests were reviewed and are high. Exacerbating diseases include chronic renal disease, diabetes and obesity. Factors aggravating her hyperlipidemia include beta blockers, thiazides and fatty foods. Current antihyperlipidemic treatment includes statins. The  current treatment provides moderate improvement of lipids. Compliance problems include adherence to diet and adherence to exercise.  Risk factors for coronary artery disease include diabetes mellitus, dyslipidemia, family history, hypertension, obesity and a sedentary lifestyle.  Hypertension This is a chronic problem. The current episode started more than 1 year ago. The problem has been waxing and waning since onset. The problem is uncontrolled. There are no associated agents to hypertension. Risk factors for coronary artery disease include diabetes mellitus, dyslipidemia, family history, obesity and sedentary lifestyle. Past treatments include beta blockers, diuretics and ACE inhibitors. The current treatment provides moderate improvement. There are no compliance problems.  Hypertensive end-organ damage includes kidney disease. with hx of left nephrectomy. Identifiable causes of hypertension include chronic renal disease and sleep apnea.     Review of systems  Constitutional: + stable body weight,  current Body mass index is 44.6 kg/m. , + fatigue, no subjective hyperthermia, no subjective hypothermia Eyes: no blurry vision, no xerophthalmia ENT: no sore throat, no nodules palpated in throat, no dysphagia/odynophagia, no hoarseness Cardiovascular: no chest  pain, no shortness of breath, no palpitations, no leg swelling Respiratory: no cough, no shortness of breath Gastrointestinal: no nausea/vomiting/diarrhea Musculoskeletal: c/o intermittent back pain- walks with walker/cane Skin: no rashes, no hyperemia Neurological: no tremors, no numbness, no tingling, no dizziness Psychiatric: no depression, no anxiety  Objective:     BP 138/76 (BP Location: Left Arm, Patient Position: Sitting, Cuff Size: Large)   Pulse 81   Ht 5\' 3"  (1.6 m)   Wt 251 lb 12.8 oz (114.2 kg)   BMI 44.60 kg/m   Wt Readings from Last 3 Encounters:  08/22/23 251 lb 12.8 oz (114.2 kg)  05/23/23 252 lb 6.4 oz (114.5 kg)  02/19/23 250 lb 12.8 oz (113.8 kg)     BP Readings from Last 3 Encounters:  08/22/23 138/76  05/23/23 138/76  02/19/23 118/71     Physical Exam- Limited  Constitutional:  Body mass index is 44.6 kg/m., not in acute distress, normal state of mind Eyes:  EOMI, no exophthalmos Musculoskeletal: no gross deformities, strength intact in all four extremities, no gross restriction of joint movements, walks with walker/cane Skin:  no rashes, no hyperemia Neurological: no tremor with outstretched hands    Diabetic Foot Exam - Simple   No data filed      CMP ( most recent) CMP     Component Value Date/Time   NA 140 02/05/2022 0000   K 4.6 02/05/2022 0000   CL 104 02/05/2022 0000   CO2 28 (A) 02/05/2022 0000   GLUCOSE 130 (H) 09/03/2021 0807   BUN 20 02/14/2023 0000   CREATININE 1.4 (A) 02/14/2023 0000   CREATININE 1.44 (H) 09/03/2021 0807   CALCIUM 10.4 02/05/2022 0000   PROT 6.9 09/03/2021 0807   ALBUMIN 4.2 02/05/2022 0000   AST 23 02/05/2022 0000   ALT 23 02/05/2022 0000   ALKPHOS 43 02/05/2022 0000   BILITOT 0.4 09/03/2021 0807   GFRNONAA 41 (L) 04/13/2018 1113   GFRAA 39 12/26/2020 0000   GFRAA 47 (L) 04/13/2018 1113     Diabetic Labs (most recent): Lab Results  Component Value Date   HGBA1C 8.7 (A) 08/22/2023   HGBA1C  8.9 (A) 05/23/2023   HGBA1C 8.3 02/14/2023   MICROALBUR 0.5 02/05/2022   MICROALBUR 1.7 12/10/2017     Lipid Panel ( most recent) Lipid Panel     Component Value Date/Time   CHOL 216 (A) 02/05/2022 0000   TRIG 184 (  A) 02/14/2023 0000   HDL 45 02/05/2022 0000   CHOLHDL 4.2 09/03/2021 0807   LDLCALC 148 02/14/2023 0000   LDLCALC 159 (H) 09/03/2021 0807      Lab Results  Component Value Date   TSH 1.31 02/14/2023   TSH 1.41 02/05/2022   TSH 2.44 09/03/2021   TSH 1.96 08/30/2020   TSH 2.547 08/06/2015   FREET4 1.1 09/03/2021           Assessment & Plan:   1) 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  Her POCT A1c today is 8.7%, improving slightly from last visit of 8.9%.  She was prescribed Ozempic last visit but misunderstood the directions and was taking it every day with her Tresiba insulin.  Taking this much did cause significant gas and bloating.  She did stop her Tradjenta though.  Analysis of her CGM shows TIR 22%, TAR 77%, TBR 1% with a GMI of 9%.  - Abigail Jackson has currently uncontrolled symptomatic type 2 DM since 57 years of age.   -Recent labs reviewed.  - I had a long discussion with her about the progressive nature of diabetes and the pathology behind its complications. -her diabetes is complicated by CKD 3 and she remains at a high risk for more acute and chronic complications which include CAD, CVA, CKD, retinopathy, and neuropathy. These are all discussed in detail with her.  - Nutritional counseling repeated at each appointment due to patients tendency to fall back in to old habits.  - The patient admits there is a room for improvement in their diet and drink choices. -  Suggestion is made for the patient to avoid simple carbohydrates from their diet including Cakes, Sweet Desserts / Pastries, Ice Cream, Soda (diet and  regular), Sweet Tea, Candies, Chips, Cookies, Sweet Pastries, Store Bought Juices, Alcohol in Excess of 1-2 drinks a day, Artificial Sweeteners, Coffee Creamer, and "Sugar-free" Products. This will help patient to have stable blood glucose profile and potentially avoid unintended weight gain.   - I encouraged the patient to switch to unprocessed or minimally processed complex starch and increased protein intake (animal or plant source), fruits, and vegetables.   - Patient is advised to stick to a routine mealtimes to eat 3 meals a day and avoid unnecessary snacks (to snack only to correct hypoglycemia).  - she will be scheduled with Norm Salt, RDN, CDE for diabetes education.  - I have approached her with the following individualized plan to manage her diabetes and patient agrees:   -She is advised to lower her Tresiba to 70 units SQ nightly and restart the Ozempic 0.25 mg SQ weekly for 2 weeks, then increase to 0.5 mg SQ weekly until next visit.  Her daughter agrees to help with this.  -She is encouraged to continue monitoring blood glucose twice daily (using her CGM), before breakfast and before bed, and to call the clinic if she has readings less than 70 or greater than 300 for 3 tests in a row.  She is benefiting from CGM device (from [employer]).  Will recommend she stay on this device.  - she is warned not to take insulin without proper monitoring per orders. - Adjustment parameters are given to her for hypo and hyperglycemia in writing.  - she is not a candidate for Metformin due to concurrent renal insufficiency.  - Specific targets for  A1c; LDL, HDL,  and Triglycerides were discussed with the patient.  2) Blood Pressure /Hypertension:  her blood pressure is controlled to target.   she is advised to continue her current medications including HCTZ 25 mg po daily, Lisinopril 5 mg p.o. daily, and Metoprolol 50 mg po twice daily with breakfast.   3) Lipids/Hyperlipidemia:    Review of  her recent lipid panel from 02/14/23 showed uncontrolled LDL at 148 .  she is advised to continue Lipitor 20 mg daily at bedtime and Tricor 145 mg po daily.  Side effects and precautions discussed with her.    4)  Weight/Diet:  her Body mass index is 44.6 kg/m.  -  clearly complicating her diabetes care.   she is a candidate for weight loss. I discussed with her the fact that loss of 5 - 10% of her  current body weight will have the most impact on her diabetes management.  Exercise, and detailed carbohydrates information provided  -  detailed on discharge instructions.  5) Thyroid nodule, incidentally found on imaging She was seen in ED in [place] for angina and had imaging identifying a thyroid nodule.  She did have nodule that warrants annual follow up, due around 11/2023.  I will order this to be done prior to next visit for follow up.  6) Chronic Care/Health Maintenance: -she is on ACEI/ARB and Statin medications and is encouraged to initiate and continue to follow up with Ophthalmology, Dentist, Podiatrist at least yearly or according to recommendations, and advised to stay away from smoking. I have recommended yearly flu vaccine and pneumonia vaccine at least every 5 years; moderate intensity exercise for up to 150 minutes weekly; and sleep for at least 7 hours a day.  - she is advised to maintain close follow up with Wilmon Pali, FNP for primary care needs, as well as her other providers for optimal and coordinated care.     I spent  32  minutes in the care of the patient today including review of labs from CMP, Lipids, Thyroid Function, Hematology (current and previous including abstractions from other facilities); face-to-face time discussing  her blood glucose readings/logs, discussing hypoglycemia and hyperglycemia episodes and symptoms, medications doses, her options of short and long term treatment based on the latest standards of care / guidelines;  discussion about incorporating  lifestyle medicine;  and documenting the encounter. Risk reduction counseling performed per USPSTF guidelines to reduce obesity and cardiovascular risk factors.     Please refer to Patient Instructions for Blood Glucose Monitoring and Insulin/Medications Dosing Guide"  in media tab for additional information. Please  also refer to " Patient Self Inventory" in the Media  tab for reviewed elements of pertinent patient history.  Abigail Jackson participated in the discussions, expressed understanding, and voiced agreement with the above plans.  All questions were answered to her satisfaction. she is encouraged to contact clinic should she have any questions or concerns prior to her return visit.     Follow up plan: - Return in about 3 months (around 11/20/2023) for Diabetes F/U with A1c in office, No previsit labs, Thyroid follow up, thyroid ultrasound.   Ronny Bacon, [facility] [address] [place], [place] [address] Phone: [phone] Fax: [phone]  08/22/2023, 8:31 AM

## 2023-08-28 ENCOUNTER — Telehealth: Payer: Self-pay | Admitting: *Deleted

## 2023-08-28 NOTE — Telephone Encounter (Signed)
 Patient called about her Pregabalin   prescription and ask if she can change the times of her medications. Reviewed the patient's medication. It appears that the last time we filled the Pregabalin  was 09/04/2021 and that was for 50 mg twice a day. Talked with the patient. She says that this prescription was sent in for once daily and that it should have been for three times daily. I shared with the patient the last time we wrote the prescription and that she should call her PCP and review this with them. She is wanting to change the times of her medications from 9 am to 6 pm. I reviewed what we prescribed and the times that they had to b taken, ( Tresiba  and Ozempic ) , patient was advised that she should call the ordering doctor of her medications and review this with them.

## 2023-09-04 ENCOUNTER — Other Ambulatory Visit: Payer: Self-pay | Admitting: *Deleted

## 2023-09-04 DIAGNOSIS — E1122 Type 2 diabetes mellitus with diabetic chronic kidney disease: Secondary | ICD-10-CM

## 2023-09-04 MED ORDER — EASY TOUCH LANCETS 30G MISC: 1 refills | Status: DC

## 2023-09-27 ENCOUNTER — Other Ambulatory Visit: Payer: Self-pay | Admitting: Nurse Practitioner

## 2023-09-27 DIAGNOSIS — Z794 Long term (current) use of insulin: Secondary | ICD-10-CM

## 2023-09-27 DIAGNOSIS — Z7984 Long term (current) use of oral hypoglycemic drugs: Secondary | ICD-10-CM

## 2023-11-04 ENCOUNTER — Ambulatory Visit (HOSPITAL_COMMUNITY)
Admission: RE | Admit: 2023-11-04 | Discharge: 2023-11-04 | Disposition: A | Discharge status: Home / Self Care | Payer: 59 | Source: Ambulatory Visit | Attending: Nurse Practitioner | Admitting: Nurse Practitioner

## 2023-11-04 DIAGNOSIS — E041 Nontoxic single thyroid nodule: Secondary | ICD-10-CM | POA: Diagnosis present

## 2023-11-12 ENCOUNTER — Other Ambulatory Visit (HOSPITAL_COMMUNITY): Payer: Self-pay | Admitting: Nurse Practitioner

## 2023-11-12 DIAGNOSIS — Z1231 Encounter for screening mammogram for malignant neoplasm of breast: Secondary | ICD-10-CM

## 2023-11-21 ENCOUNTER — Encounter: Payer: Self-pay | Admitting: Nurse Practitioner

## 2023-11-21 ENCOUNTER — Ambulatory Visit (INDEPENDENT_AMBULATORY_CARE_PROVIDER_SITE_OTHER): Payer: 59 | Admitting: Nurse Practitioner

## 2023-11-21 VITALS — BP 138/78 | HR 71 | Ht 63.0 in | Wt 259.2 lb

## 2023-11-21 DIAGNOSIS — E1122 Type 2 diabetes mellitus with diabetic chronic kidney disease: Secondary | ICD-10-CM

## 2023-11-21 DIAGNOSIS — Z794 Long term (current) use of insulin: Secondary | ICD-10-CM | POA: Diagnosis not present

## 2023-11-21 DIAGNOSIS — N1832 Chronic kidney disease, stage 3b: Secondary | ICD-10-CM | POA: Diagnosis not present

## 2023-11-21 DIAGNOSIS — I10 Essential (primary) hypertension: Secondary | ICD-10-CM

## 2023-11-21 DIAGNOSIS — Z7984 Long term (current) use of oral hypoglycemic drugs: Secondary | ICD-10-CM

## 2023-11-21 DIAGNOSIS — E782 Mixed hyperlipidemia: Secondary | ICD-10-CM | POA: Diagnosis not present

## 2023-11-21 LAB — POCT GLYCOSYLATED HEMOGLOBIN (HGB A1C): Hemoglobin A1C: 7.9 % — AB (ref 4.0–5.6)

## 2023-11-21 MED ORDER — SEMAGLUTIDE (1 MG/DOSE) 4 MG/3ML ~~LOC~~ SOPN: 1.0000 mg | PEN_INJECTOR | SUBCUTANEOUS | 1 refills | Status: DC

## 2023-11-21 MED ORDER — TRESIBA FLEXTOUCH 200 UNIT/ML ~~LOC~~ SOPN: 70.0000 [IU] | PEN_INJECTOR | Freq: Every day | SUBCUTANEOUS | 1 refills | Status: DC

## 2023-11-21 NOTE — Progress Notes (Signed)
 Endocrinology Follow Up Note       11/21/2023, 10:44 AM   Subjective:    Patient ID: Abigail Jackson, female    DOB: 06/25/1966.  Abigail Jackson is being seen in follow up after being seen in follow up after being seen in consultation for management of currently uncontrolled symptomatic diabetes requested by  Wilmon Pali, FNP.   Past Medical History:  Diagnosis Date   Arthritis    Diabetes mellitus without complication (HCC)    Heart murmur    Hyperlipidemia    Hypertension    Irregular bowel habits    Renal neoplasm    LEFT    Past Surgical History:  Procedure Laterality Date   ROBOTIC ASSITED PARTIAL NEPHRECTOMY Left 08/03/2015   Procedure: ROBOTIC ASSISTED PARTIAL LEFT NEPHRECTOMY;  Surgeon: Heloise Purpura, MD;  Location: [facility];  Service: Urology;  Laterality: Left;   SHOULDER SURGERY  2006   RIGHT    Social History   Socioeconomic History   Marital status: Married    Spouse name: Not on file   Number of children: Not on file   Years of education: Not on file   Highest education level: Not on file  Occupational History   Not on file  Tobacco Use   Smoking status: Never   Smokeless tobacco: Never  Vaping Use   Vaping status: Never Used  Substance and Sexual Activity   Alcohol use: No   Drug use: No   Sexual activity: Not on file  Other Topics Concern   Not on file  Social History Narrative   Not on file   Social Drivers of Health   Financial Resource Strain: Not on file  Food Insecurity: Not on file  Transportation Needs: Not on file  Physical Activity: Not on file  Stress: Not on file  Social Connections: Not on file    Family History  Problem Relation Age of Onset   Diabetes Mother    Hypertension Mother    Diabetes Father    Hypertension Father    Thyroid disease Father    Hyperlipidemia Father    Kidney disease Father    Heart failure Father     Outpatient  Encounter Medications as of 11/21/2023  Medication Sig   acetaminophen (TYLENOL) 325 MG tablet Take by mouth.   amitriptyline (ELAVIL) 100 MG tablet Take 100 mg by mouth at bedtime.   amLODipine (NORVASC) 5 MG tablet Take 1 tablet by mouth daily.   aspirin 81 MG EC tablet 1 tablet   cholecalciferol (VITAMIN D) 400 units TABS tablet Take 400 Units by mouth.   dicyclomine (BENTYL) 10 MG capsule Take 1 capsule by mouth 2 (two) times daily.   ezetimibe (ZETIA) 10 MG tablet Take 1 tablet by mouth daily.   Insulin Pen Needle (EASY TOUCH PEN NEEDLES) 31G X 8 MM MISC Use with insulin bid. E11.65   Insulin Pen Needle (NOVOFINE PEN NEEDLE) 32G X 6 MM MISC 1 Box by Does not apply route at bedtime.   metoprolol succinate (TOPROL-XL) 50 MG 24 hr tablet Take 1 tablet (50 mg total) by mouth daily. Take with or immediately following a meal. (Patient taking differently:  Take 50 mg by mouth 2 (two) times daily. Take with or immediately following a meal.)   metoprolol tartrate (LOPRESSOR) 50 MG tablet Take 50 mg by mouth daily.   omeprazole (PRILOSEC) 40 MG capsule Take 1 capsule (40 mg total) by mouth at bedtime.   ONETOUCH ULTRA test strip USE TO TEST BLOOD SUGAR FOUR TIMES A DAY AS DIRECTED   oxyCODONE (OXY IR/ROXICODONE) 5 MG immediate release tablet Take by mouth.   pregabalin (LYRICA) 50 MG capsule Take 50 mg by mouth 2 (two) times daily.   Semaglutide, 1 MG/DOSE, 4 MG/3ML SOPN Inject 1 mg as directed once a week.   [DISCONTINUED] insulin degludec (TRESIBA FLEXTOUCH) 200 UNIT/ML FlexTouch Pen Inject 70 Units into the skin at bedtime.   [DISCONTINUED] OZEMPIC, 0.25 OR 0.5 MG/DOSE, 2 MG/3ML SOPN INJECT 0.25MG  SUBCUTANEOUSLY WEEKLY FOR 2 WEEKS, THEN 0.5MG  WEEKLY (PATIENT TO STOP TAKING TRAJENTA).   Easy Touch Lancets 30G MISC USE AS DIRECTED TO TEST BLOOD GLUCOSE FOUR TIMES A DAY   fenofibrate (TRICOR) 145 MG tablet Take 145 mg by mouth daily. (Patient not taking: Reported on 11/21/2023)   insulin degludec  (TRESIBA FLEXTOUCH) 200 UNIT/ML FlexTouch Pen Inject 70 Units into the skin at bedtime.   No facility-administered encounter medications on file as of 11/21/2023.    ALLERGIES: Allergies  Allergen Reactions   Tramadol Itching and Rash    VACCINATION STATUS:  There is no immunization history on file for this patient.  Diabetes She presents for her follow-up diabetic visit. She has type 2 diabetes mellitus. Onset time: Diagnosed at approx age of 58. Her disease course has been improving. There are no hypoglycemic associated symptoms. Associated symptoms include fatigue. There are no hypoglycemic complications. Symptoms are stable. Diabetic complications include nephropathy. Risk factors for coronary artery disease include diabetes mellitus, dyslipidemia, family history, obesity, hypertension and sedentary lifestyle. Current diabetic treatment includes insulin injections and oral agent (monotherapy). She is compliant with treatment most of the time. Her weight is increasing steadily. She is following a generally healthy diet. When asked about meal planning, she reported none. She has not had a previous visit with a dietitian. She rarely participates in exercise. Her home blood glucose trend is decreasing steadily. Her overall blood glucose range is 180-200 mg/dl. (She presents today, accompanied by her daughter, with her CGM showing improving but still above target glycemic profile overall.  Her POCT A1c today is 7.9%, improving slightly from last visit of 8.7%.  Analysis of her CGM shows TIR 36%, TAR 64%, TBR 0% with a GMI of 7.9%.) An ACE inhibitor/angiotensin II receptor blocker is being taken. She does not see a podiatrist.Eye exam is current.  Hyperlipidemia This is a chronic problem. The current episode started more than 1 year ago. The problem is uncontrolled. Recent lipid tests were reviewed and are high. Exacerbating diseases include chronic renal disease, diabetes and obesity. Factors  aggravating her hyperlipidemia include beta blockers, thiazides and fatty foods. Current antihyperlipidemic treatment includes statins. The current treatment provides moderate improvement of lipids. Compliance problems include adherence to diet and adherence to exercise.  Risk factors for coronary artery disease include diabetes mellitus, dyslipidemia, family history, hypertension, obesity and a sedentary lifestyle.  Hypertension This is a chronic problem. The current episode started more than 1 year ago. The problem has been waxing and waning since onset. The problem is uncontrolled. There are no associated agents to hypertension. Risk factors for coronary artery disease include diabetes mellitus, dyslipidemia, family history, obesity and sedentary lifestyle.  Past treatments include beta blockers, diuretics and ACE inhibitors. The current treatment provides moderate improvement. There are no compliance problems.  Hypertensive end-organ damage includes kidney disease. with hx of left nephrectomy. Identifiable causes of hypertension include chronic renal disease and sleep apnea.     Review of systems  Constitutional: + increasing body weight,  current Body mass index is 45.92 kg/m. , + fatigue, no subjective hyperthermia, no subjective hypothermia Eyes: no blurry vision, no xerophthalmia ENT: no sore throat, no nodules palpated in throat, no dysphagia/odynophagia, no hoarseness Cardiovascular: no chest pain, no shortness of breath, no palpitations, no leg swelling Respiratory: no cough, no shortness of breath Gastrointestinal: no nausea/vomiting/diarrhea Musculoskeletal: c/o intermittent back pain- walks with walker/cane Skin: no rashes, no hyperemia Neurological: no tremors, no numbness, no tingling, no dizziness Psychiatric: no depression, no anxiety  Objective:     BP 138/78 (BP Location: Left Arm, Patient Position: Sitting, Cuff Size: Large)   Pulse 71   Ht 5\' 3"  (1.6 m)   Wt 259 lb 3.2  oz (117.6 kg)   BMI 45.92 kg/m   Wt Readings from Last 3 Encounters:  11/21/23 259 lb 3.2 oz (117.6 kg)  08/22/23 251 lb 12.8 oz (114.2 kg)  05/23/23 252 lb 6.4 oz (114.5 kg)     BP Readings from Last 3 Encounters:  11/21/23 138/78  08/22/23 138/76  05/23/23 138/76     Physical Exam- Limited  Constitutional:  Body mass index is 45.92 kg/m., not in acute distress, normal state of mind Eyes:  EOMI, no exophthalmos Musculoskeletal: no gross deformities, strength intact in all four extremities, no gross restriction of joint movements, walks with walker/cane Skin:  no rashes, no hyperemia Neurological: no tremor with outstretched hands    Diabetic Foot Exam - Simple   No data filed      CMP ( most recent) CMP     Component Value Date/Time   NA 140 02/05/2022 0000   K 4.6 02/05/2022 0000   CL 104 02/05/2022 0000   CO2 28 (A) 02/05/2022 0000   GLUCOSE 130 (H) 09/03/2021 0807   BUN 20 02/14/2023 0000   CREATININE 1.4 (A) 02/14/2023 0000   CREATININE 1.44 (H) 09/03/2021 0807   CALCIUM 10.4 02/05/2022 0000   PROT 6.9 09/03/2021 0807   ALBUMIN 4.2 02/05/2022 0000   AST 23 02/05/2022 0000   ALT 23 02/05/2022 0000   ALKPHOS 43 02/05/2022 0000   BILITOT 0.4 09/03/2021 0807   GFRNONAA 41 (L) 04/13/2018 1113   GFRAA 39 12/26/2020 0000   GFRAA 47 (L) 04/13/2018 1113     Diabetic Labs (most recent): Lab Results  Component Value Date   HGBA1C 7.9 (A) 11/21/2023   HGBA1C 8.7 (A) 08/22/2023   HGBA1C 8.9 (A) 05/23/2023   MICROALBUR 0.5 02/05/2022   MICROALBUR 1.7 12/10/2017     Lipid Panel ( most recent) Lipid Panel     Component Value Date/Time   CHOL 216 (A) 02/05/2022 0000   TRIG 184 (A) 02/14/2023 0000   HDL 45 02/05/2022 0000   CHOLHDL 4.2 09/03/2021 0807   LDLCALC 148 02/14/2023 0000   LDLCALC 159 (H) 09/03/2021 0807      Lab Results  Component Value Date   TSH 1.31 02/14/2023   TSH 1.41 02/05/2022   TSH 2.44 09/03/2021   TSH 1.96 08/30/2020   TSH  2.547 08/06/2015   FREET4 1.1 09/03/2021           Assessment & Plan:   1) Uncontrolled Type  2 Diabetes with kidney complications:  She presents today, accompanied by her daughter, with her CGM showing improving but still above target glycemic profile overall.  Her POCT A1c today is 7.9%, improving slightly from last visit of 8.7%.  Analysis of her CGM shows TIR 36%, TAR 64%, TBR 0% with a GMI of 7.9%.  - Abigail Jackson has currently uncontrolled symptomatic type 2 DM since 58 years of age.   -Recent labs reviewed.  - I had a long discussion with her about the progressive nature of diabetes and the pathology behind its complications. -her diabetes is complicated by CKD 3 and she remains at a high risk for more acute and chronic complications which include CAD, CVA, CKD, retinopathy, and neuropathy. These are all discussed in detail with her.  - Nutritional counseling repeated at each appointment due to patients tendency to fall back in to old habits.  - The patient admits there is a room for improvement in their diet and drink choices. -  Suggestion is made for the patient to avoid simple carbohydrates from their diet including Cakes, Sweet Desserts / Pastries, Ice Cream, Soda (diet and regular), Sweet Tea, Candies, Chips, Cookies, Sweet Pastries, Store Bought Juices, Alcohol in Excess of 1-2 drinks a day, Artificial Sweeteners, Coffee Creamer, and "Sugar-free" Products. This will help patient to have stable blood glucose profile and potentially avoid unintended weight gain.   - I encouraged the patient to switch to unprocessed or minimally processed complex starch and increased protein intake (animal or plant source), fruits, and vegetables.   - Patient is advised to stick to a routine mealtimes to eat 3 meals a day and avoid unnecessary snacks (to snack only to correct hypoglycemia).  - she will be scheduled with Norm Salt, RDN, CDE for diabetes education.  - I have  approached her with the following individualized plan to manage her diabetes and patient agrees:   -She is advised to continue her Tresiba 70 units SQ nightly and increase the Ozempic to 1 mg SQ weekly.  She has tolerated this well without any unpleasant side effects.  -She is encouraged to continue monitoring blood glucose twice daily (using her CGM), before breakfast and before bed, and to call the clinic if she has readings less than 70 or greater than 300 for 3 tests in a row.  She is benefiting from CGM device (from [employer]).  Will recommend she stay on this device.  - she is warned not to take insulin without proper monitoring per orders. - Adjustment parameters are given to her for hypo and hyperglycemia in writing.  - she is not a candidate for Metformin due to concurrent renal insufficiency.  - Specific targets for  A1c; LDL, HDL, and Triglycerides were discussed with the patient.  2) Blood Pressure /Hypertension:  her blood pressure is controlled to target.   she is advised to continue her current medications including HCTZ 25 mg po daily, Lisinopril 5 mg p.o. daily, and Metoprolol 50 mg po twice daily with breakfast.   3) Lipids/Hyperlipidemia:    Review of her recent lipid panel from 02/14/23 showed uncontrolled LDL at 148 .  she is advised to continue Lipitor 20 mg daily at bedtime and Tricor 145 mg po daily.  Side effects and precautions discussed with her.    4)  Weight/Diet:  her Body mass index is 45.92 kg/m.  -  clearly complicating her diabetes care.   she is a candidate for weight loss. I discussed with her  the fact that loss of 5 - 10% of her  current body weight will have the most impact on her diabetes management.  Exercise, and detailed carbohydrates information provided  -  detailed on discharge instructions.  5) Thyroid nodule, incidentally found on imaging She was seen in ED in [place] for angina and had imaging identifying a thyroid nodule.  She did have nodule  that warrants annual follow up, due around 11/2023.  Her repeat thyroid ultrasound on 11/04/23 shows stable 1.2 cm isthmus nodule, recommending continued annual surveillance until 5 years stability is reached.  6) Chronic Care/Health Maintenance: -she is on ACEI/ARB and Statin medications and is encouraged to initiate and continue to follow up with Ophthalmology, Dentist, Podiatrist at least yearly or according to recommendations, and advised to stay away from smoking. I have recommended yearly flu vaccine and pneumonia vaccine at least every 5 years; moderate intensity exercise for up to 150 minutes weekly; and sleep for at least 7 hours a day.  - she is advised to maintain close follow up with Wilmon Pali, FNP for primary care needs, as well as her other providers for optimal and coordinated care.      I spent  26  minutes in the care of the patient today including review of labs from CMP, Lipids, Thyroid Function, Hematology (current and previous including abstractions from other facilities); face-to-face time discussing  her blood glucose readings/logs, discussing hypoglycemia and hyperglycemia episodes and symptoms, medications doses, her options of short and long term treatment based on the latest standards of care / guidelines;  discussion about incorporating lifestyle medicine;  and documenting the encounter. Risk reduction counseling performed per USPSTF guidelines to reduce obesity and cardiovascular risk factors.     Please refer to Patient Instructions for Blood Glucose Monitoring and Insulin/Medications Dosing Guide"  in media tab for additional information. Please  also refer to " Patient Self Inventory" in the Media  tab for reviewed elements of pertinent patient history.  Abigail Jackson participated in the discussions, expressed understanding, and voiced agreement with the above plans.  All questions were answered to her satisfaction. she is encouraged to contact clinic should she  have any questions or concerns prior to her return visit.     Follow up plan: - Return in about 3 months (around 02/21/2024) for Diabetes F/U with A1c in office, No previsit labs, Bring meter and logs.   Ronny Bacon, [facility] [address] [place], [place] [address] Phone: [phone] Fax: [phone]  11/21/2023, 10:44 AM

## 2023-11-26 ENCOUNTER — Encounter (HOSPITAL_COMMUNITY): Payer: Self-pay

## 2023-11-26 ENCOUNTER — Ambulatory Visit (HOSPITAL_COMMUNITY)
Admission: RE | Admit: 2023-11-26 | Discharge: 2023-11-26 | Disposition: A | Discharge status: Home / Self Care | Source: Ambulatory Visit | Attending: Nurse Practitioner | Admitting: Nurse Practitioner

## 2023-11-26 DIAGNOSIS — Z1231 Encounter for screening mammogram for malignant neoplasm of breast: Secondary | ICD-10-CM | POA: Insufficient documentation

## 2023-11-28 ENCOUNTER — Inpatient Hospital Stay
Admission: RE | Admit: 2023-11-28 | Discharge: 2023-11-28 | Disposition: A | Discharge status: Home / Self Care | Payer: Self-pay | Source: Ambulatory Visit | Attending: Nurse Practitioner | Admitting: Nurse Practitioner

## 2023-11-28 ENCOUNTER — Other Ambulatory Visit (HOSPITAL_COMMUNITY): Payer: Self-pay | Admitting: Nurse Practitioner

## 2023-11-28 DIAGNOSIS — Z1231 Encounter for screening mammogram for malignant neoplasm of breast: Secondary | ICD-10-CM

## 2024-03-03 ENCOUNTER — Encounter: Payer: Self-pay | Admitting: Nurse Practitioner

## 2024-03-03 ENCOUNTER — Ambulatory Visit (INDEPENDENT_AMBULATORY_CARE_PROVIDER_SITE_OTHER): Admitting: Nurse Practitioner

## 2024-03-03 VITALS — BP 134/80 | HR 90 | Ht 63.0 in | Wt 252.8 lb

## 2024-03-03 DIAGNOSIS — E1122 Type 2 diabetes mellitus with diabetic chronic kidney disease: Secondary | ICD-10-CM

## 2024-03-03 DIAGNOSIS — E782 Mixed hyperlipidemia: Secondary | ICD-10-CM

## 2024-03-03 DIAGNOSIS — Z794 Long term (current) use of insulin: Secondary | ICD-10-CM | POA: Diagnosis not present

## 2024-03-03 DIAGNOSIS — Z7984 Long term (current) use of oral hypoglycemic drugs: Secondary | ICD-10-CM

## 2024-03-03 DIAGNOSIS — E041 Nontoxic single thyroid nodule: Secondary | ICD-10-CM

## 2024-03-03 DIAGNOSIS — I10 Essential (primary) hypertension: Secondary | ICD-10-CM | POA: Diagnosis not present

## 2024-03-03 DIAGNOSIS — N1832 Chronic kidney disease, stage 3b: Secondary | ICD-10-CM

## 2024-03-03 MED ORDER — SEMAGLUTIDE (1 MG/DOSE) 4 MG/3ML ~~LOC~~ SOPN: 1.0000 mg | PEN_INJECTOR | SUBCUTANEOUS | 1 refills | Status: DC

## 2024-03-03 MED ORDER — NOVOFINE PEN NEEDLE 32G X 6 MM MISC: 1.0000 | Freq: Every day | 3 refills | Status: DC

## 2024-03-03 MED ORDER — TRESIBA FLEXTOUCH 200 UNIT/ML ~~LOC~~ SOPN: 70.0000 [IU] | PEN_INJECTOR | Freq: Every day | SUBCUTANEOUS | 1 refills | Status: DC

## 2024-03-03 NOTE — Progress Notes (Signed)
 Endocrinology Follow Up Note       03/03/2024, 11:23 AM   Subjective:    Patient ID: Abigail Jackson, female    DOB: 1966-02-09.  DORIANN ZUCH is being seen in follow up after being seen in follow up after being seen in consultation for management of currently uncontrolled symptomatic diabetes requested by  Gerome Tillman CROME, FNP.   Past Medical History:  Diagnosis Date   Arthritis    Diabetes mellitus without complication (HCC)    Heart murmur    Hyperlipidemia    Hypertension    Irregular bowel habits    Renal neoplasm    LEFT    Past Surgical History:  Procedure Laterality Date   ROBOTIC ASSITED PARTIAL NEPHRECTOMY Left 08/03/2015   Procedure: ROBOTIC ASSISTED PARTIAL LEFT NEPHRECTOMY;  Surgeon: Gretel Ferrara, MD;  Location: [facility];  Service: Urology;  Laterality: Left;   SHOULDER SURGERY  2006   RIGHT    Social History   Socioeconomic History   Marital status: Married    Spouse name: Not on file   Number of children: Not on file   Years of education: Not on file   Highest education level: Not on file  Occupational History   Not on file  Tobacco Use   Smoking status: Never   Smokeless tobacco: Never  Vaping Use   Vaping status: Never Used  Substance and Sexual Activity   Alcohol use: No   Drug use: No   Sexual activity: Not on file  Other Topics Concern   Not on file  Social History Narrative   Not on file   Social Drivers of Health   Financial Resource Strain: Not on file  Food Insecurity: Not on file  Transportation Needs: Not on file  Physical Activity: Not on file  Stress: Not on file  Social Connections: Not on file    Family History  Problem Relation Age of Onset   Diabetes Mother    Hypertension Mother    Diabetes Father    Hypertension Father    Thyroid  disease Father    Hyperlipidemia Father    Kidney disease Father    Heart failure Father     Outpatient  Encounter Medications as of 03/03/2024  Medication Sig   acetaminophen  (TYLENOL ) 325 MG tablet Take by mouth.   amitriptyline  (ELAVIL ) 100 MG tablet Take 100 mg by mouth at bedtime.   amLODipine (NORVASC) 5 MG tablet Take 1 tablet by mouth daily.   aspirin 81 MG EC tablet 1 tablet   cholecalciferol (VITAMIN D ) 400 units TABS tablet Take 400 Units by mouth.   dicyclomine (BENTYL) 10 MG capsule Take 1 capsule by mouth 2 (two) times daily.   Easy Touch Lancets 30G MISC USE AS DIRECTED TO TEST BLOOD GLUCOSE FOUR TIMES A DAY   ezetimibe (ZETIA) 10 MG tablet Take 1 tablet by mouth daily.   fenofibrate (TRICOR) 145 MG tablet Take 145 mg by mouth daily.   Insulin  Pen Needle (EASY TOUCH PEN NEEDLES) 31G X 8 MM MISC Use with insulin  bid. E11.65   metoprolol  tartrate (LOPRESSOR ) 50 MG tablet Take 50 mg by mouth daily.   omeprazole  (PRILOSEC) 40 MG  capsule Take 1 capsule (40 mg total) by mouth at bedtime.   ONETOUCH ULTRA test strip USE TO TEST BLOOD SUGAR FOUR TIMES A DAY AS DIRECTED   oxyCODONE  (OXY IR/ROXICODONE ) 5 MG immediate release tablet Take by mouth.   pregabalin  (LYRICA ) 50 MG capsule Take 50 mg by mouth 2 (two) times daily. (Patient taking differently: Take 50 mg by mouth 3 (three) times daily.)   [DISCONTINUED] insulin  degludec (TRESIBA  FLEXTOUCH) 200 UNIT/ML FlexTouch Pen Inject 70 Units into the skin at bedtime.   [DISCONTINUED] Insulin  Pen Needle (NOVOFINE PEN NEEDLE) 32G X 6 MM MISC 1 Box by Does not apply route at bedtime.   [DISCONTINUED] Semaglutide , 1 MG/DOSE, 4 MG/3ML SOPN Inject 1 mg as directed once a week.   insulin  degludec (TRESIBA  FLEXTOUCH) 200 UNIT/ML FlexTouch Pen Inject 70 Units into the skin at bedtime.   Insulin  Pen Needle (NOVOFINE PEN NEEDLE) 32G X 6 MM MISC 1 Box by Does not apply route at bedtime.   metoprolol  succinate (TOPROL -XL) 50 MG 24 hr tablet Take 1 tablet (50 mg total) by mouth daily. Take with or immediately following a meal. (Patient not taking: Reported on  03/03/2024)   Semaglutide , 1 MG/DOSE, 4 MG/3ML SOPN Inject 1 mg as directed once a week.   No facility-administered encounter medications on file as of 03/03/2024.    ALLERGIES: Allergies  Allergen Reactions   Tramadol Itching and Rash    VACCINATION STATUS: Immunization History  Administered Date(s) Administered   Moderna Covid-19 Fall Seasonal Vaccine 60yrs & older 06/26/2022   Moderna Sars-Covid-2 Vaccination 05/24/2020, 06/21/2020    Diabetes She presents for her follow-up diabetic visit. She has type 2 diabetes mellitus. Onset time: Diagnosed at approx age of 22. Her disease course has been improving. There are no hypoglycemic associated symptoms. Associated symptoms include fatigue. There are no hypoglycemic complications. Symptoms are stable. Diabetic complications include nephropathy. Risk factors for coronary artery disease include diabetes mellitus, dyslipidemia, family history, obesity, hypertension and sedentary lifestyle. Current diabetic treatment includes insulin  injections (and Ozempic ). She is compliant with treatment most of the time. Her weight is decreasing steadily. She is following a generally healthy diet. When asked about meal planning, she reported none. She has not had a previous visit with a dietitian. She rarely participates in exercise. Her home blood glucose trend is decreasing steadily. Her overall blood glucose range is 140-180 mg/dl. (She presents today, with her CGM showing improving but still slightly above target glycemic profile overall.  Her most recent A1c at her PCP office on 5/28 was 7.9%, unchanged from previous visit.  Analysis of her CGM shows TIR 49%, TAR 51%, TBR <1% with a GMI of 7.6%.  She notes she has had several issues with Dexcom not working properly, reading low when her glucose was not really low.) An ACE inhibitor/angiotensin II receptor blocker is being taken. She does not see a podiatrist.Eye exam is current.  Hyperlipidemia This is a chronic  problem. The current episode started more than 1 year ago. The problem is uncontrolled. Recent lipid tests were reviewed and are high. Exacerbating diseases include chronic renal disease, diabetes and obesity. Factors aggravating her hyperlipidemia include beta blockers, thiazides and fatty foods. Current antihyperlipidemic treatment includes statins. The current treatment provides moderate improvement of lipids. Compliance problems include adherence to diet and adherence to exercise.  Risk factors for coronary artery disease include diabetes mellitus, dyslipidemia, family history, hypertension, obesity and a sedentary lifestyle.  Hypertension This is a chronic problem. The current episode  started more than 1 year ago. The problem has been waxing and waning since onset. The problem is uncontrolled. There are no associated agents to hypertension. Risk factors for coronary artery disease include diabetes mellitus, dyslipidemia, family history, obesity and sedentary lifestyle. Past treatments include beta blockers, diuretics and ACE inhibitors. The current treatment provides moderate improvement. There are no compliance problems.  Hypertensive end-organ damage includes kidney disease. with hx of left nephrectomy. Identifiable causes of hypertension include chronic renal disease and sleep apnea.     Review of systems  Constitutional: + decreasing body weight,  current Body mass index is 44.78 kg/m. , + fatigue, no subjective hyperthermia, no subjective hypothermia Eyes: no blurry vision, no xerophthalmia ENT: no sore throat, no nodules palpated in throat, no dysphagia/odynophagia, no hoarseness Cardiovascular: no chest pain, no shortness of breath, no palpitations, no leg swelling Respiratory: no cough, no shortness of breath Gastrointestinal: no nausea/vomiting/diarrhea Musculoskeletal: c/o intermittent back pain- walks with walker/cane Skin: no rashes, no hyperemia Neurological: no tremors, no  numbness, no tingling, no dizziness Psychiatric: no depression, no anxiety  Objective:     BP 134/80 (BP Location: Left Arm, Patient Position: Sitting, Cuff Size: Large)   Pulse 90   Ht 5' 3 (1.6 m)   Wt 252 lb 12.8 oz (114.7 kg)   BMI 44.78 kg/m   Wt Readings from Last 3 Encounters:  03/03/24 252 lb 12.8 oz (114.7 kg)  11/21/23 259 lb 3.2 oz (117.6 kg)  08/22/23 251 lb 12.8 oz (114.2 kg)     BP Readings from Last 3 Encounters:  03/03/24 134/80  11/21/23 138/78  08/22/23 138/76     Physical Exam- Limited  Constitutional:  Body mass index is 44.78 kg/m., not in acute distress, normal state of mind Eyes:  EOMI, no exophthalmos Musculoskeletal: no gross deformities, strength intact in all four extremities, no gross restriction of joint movements, walks with walker/cane Skin:  no rashes, no hyperemia Neurological: no tremor with outstretched hands    Diabetic Foot Exam - Simple   Simple Foot Form Diabetic Foot exam was performed with the following findings: Yes 03/03/2024 11:10 AM  Visual Inspection No deformities, no ulcerations, no other skin breakdown bilaterally: Yes Sensation Testing Intact to touch and monofilament testing bilaterally: Yes Pulse Check Posterior Tibialis and Dorsalis pulse intact bilaterally: Yes Comments Mild onychomycosis to second toe bilaterally      CMP ( most recent) CMP     Component Value Date/Time   NA 140 02/05/2022 0000   K 4.6 02/05/2022 0000   CL 104 02/05/2022 0000   CO2 28 (A) 02/05/2022 0000   GLUCOSE 130 (H) 09/03/2021 0807   BUN 20 02/14/2023 0000   CREATININE 1.4 (A) 02/14/2023 0000   CREATININE 1.44 (H) 09/03/2021 0807   CALCIUM 10.4 02/05/2022 0000   PROT 6.9 09/03/2021 0807   ALBUMIN 4.2 02/05/2022 0000   AST 23 02/05/2022 0000   ALT 23 02/05/2022 0000   ALKPHOS 43 02/05/2022 0000   BILITOT 0.4 09/03/2021 0807   GFRNONAA 41 (L) 04/13/2018 1113   GFRAA 39 12/26/2020 0000   GFRAA 47 (L) 04/13/2018 1113      Diabetic Labs (most recent): Lab Results  Component Value Date   HGBA1C 7.9 (A) 11/21/2023   HGBA1C 8.7 (A) 08/22/2023   HGBA1C 8.9 (A) 05/23/2023   MICROALBUR 0.5 02/05/2022   MICROALBUR 1.7 12/10/2017     Lipid Panel ( most recent) Lipid Panel     Component Value Date/Time   CHOL  216 (A) 02/05/2022 0000   TRIG 184 (A) 02/14/2023 0000   HDL 45 02/05/2022 0000   CHOLHDL 4.2 09/03/2021 0807   LDLCALC 148 02/14/2023 0000   LDLCALC 159 (H) 09/03/2021 0807      Lab Results  Component Value Date   TSH 1.31 02/14/2023   TSH 1.41 02/05/2022   TSH 2.44 09/03/2021   TSH 1.96 08/30/2020   TSH 2.547 08/06/2015   FREET4 1.1 09/03/2021           Assessment & Plan:   1) 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  Her most recent A1c at her PCP office on 5/28 was 7.9%, unchanged from previous visit.  Analysis of her CGM shows TIR 49%, TAR 51%, TBR <1% with a GMI of 7.6%.  She notes she has had several issues with Dexcom not working properly, reading low when her glucose was not really low.  - MIRTHA JAIN has currently uncontrolled symptomatic type 2 DM since 58 years of age.   -Recent labs reviewed.  - I had a long discussion with her about the progressive nature of diabetes and the pathology behind its complications. -her diabetes is complicated by CKD 3 and she remains at a high risk for more acute and chronic complications which include CAD, CVA, CKD, retinopathy, and neuropathy. These are all discussed in detail with her.  - Nutritional counseling repeated at each appointment due to patients tendency to fall back in to old habits.  - The patient admits there is a room for improvement in their diet and drink choices. -  Suggestion is made for the patient to avoid simple carbohydrates from their diet including Cakes, Sweet Desserts / Pastries,  Ice Cream, Soda (diet and regular), Sweet Tea, Candies, Chips, Cookies, Sweet Pastries, Store Bought Juices, Alcohol in Excess of 1-2 drinks a day, Artificial Sweeteners, Coffee Creamer, and Sugar-free Products. This will help patient to have stable blood glucose profile and potentially avoid unintended weight gain.   - I encouraged the patient to switch to unprocessed or minimally processed complex starch and increased protein intake (animal or plant source), fruits, and vegetables.   - Patient is advised to stick to a routine mealtimes to eat 3 meals a day and avoid unnecessary snacks (to snack only to correct hypoglycemia).  - she will be scheduled with Penny Crumpton, RDN, CDE for diabetes education.  - I have approached her with the following individualized plan to manage her diabetes and patient agrees:   -Based on her improving readings, no changes will be made to her medication today.  She is advised to continue her Tresiba  70 units SQ nightly and Ozempic  1 mg SQ weekly.  She has tolerated this well without any unpleasant side effects.  -She is encouraged to continue monitoring blood glucose twice daily (using her CGM), before breakfast and before bed, and to call the clinic if she has readings less than 70 or greater than 300 for 3 tests in a row.  She is benefiting from CGM device (from [employer]).  Will recommend she stay on this device, despite some recent concerns with quality.  - she is warned not to take insulin  without proper monitoring per orders. - Adjustment parameters are given to her for hypo and hyperglycemia in writing.  - she is not a candidate for Metformin due to concurrent renal insufficiency.  - Specific targets for  A1c; LDL, HDL, and Triglycerides were discussed with the patient.  2) Blood Pressure /Hypertension:  her blood pressure is controlled to target.   she is advised to continue her current medications as prescribed by PCP.  3) Lipids/Hyperlipidemia:     Review of her recent lipid panel from 01/21/24 showed uncontrolled LDL at 125 (improving) .  she is advised to continue Lipitor 20 mg daily at bedtime and Tricor 145 mg po daily.  Side effects and precautions discussed with her.    4)  Weight/Diet:  her Body mass index is 44.78 kg/m.  -  clearly complicating her diabetes care.   she is a candidate for weight loss. I discussed with her the fact that loss of 5 - 10% of her  current body weight will have the most impact on her diabetes management.  Exercise, and detailed carbohydrates information provided  -  detailed on discharge instructions.  5) Thyroid  nodule, incidentally found on imaging She was seen in ED in [place] for angina and had imaging identifying a thyroid  nodule.  She did have nodule that warrants annual follow up, due around 11/2023.  Her repeat thyroid  ultrasound on 11/04/23 shows stable 1.2 cm isthmus nodule, recommending continued annual surveillance until 5 years stability is reached.  Will order this at next visit.  6) Chronic Care/Health Maintenance: -she is on ACEI/ARB and Statin medications and is encouraged to initiate and continue to follow up with Ophthalmology, Dentist, Podiatrist at least yearly or according to recommendations, and advised to stay away from smoking. I have recommended yearly flu vaccine and pneumonia vaccine at least every 5 years; moderate intensity exercise for up to 150 minutes weekly; and sleep for at least 7 hours a day.  - she is advised to maintain close follow up with Gerome Tillman CROME, FNP for primary care needs, as well as her other providers for optimal and coordinated care.      I spent  41  minutes in the care of the patient today including review of labs from CMP, Lipids, Thyroid  Function, Hematology (current and previous including abstractions from other facilities); face-to-face time discussing  her blood glucose readings/logs, discussing hypoglycemia and hyperglycemia episodes and symptoms,  medications doses, her options of short and long term treatment based on the latest standards of care / guidelines;  discussion about incorporating lifestyle medicine;  and documenting the encounter. Risk reduction counseling performed per USPSTF guidelines to reduce obesity and cardiovascular risk factors.     Please refer to Patient Instructions for Blood Glucose Monitoring and Insulin /Medications Dosing Guide  in media tab for additional information. Please  also refer to  Patient Self Inventory in the Media  tab for reviewed elements of pertinent patient history.  Abigail Jackson participated in the discussions, expressed understanding, and voiced agreement with the above plans.  All questions were answered to her satisfaction. she is encouraged to contact clinic should she have any questions or concerns prior to her return visit.     Follow up plan: - Return in about 4 months (around 07/04/2024) for Diabetes F/U with A1c in office, No previsit labs, Bring meter and logs.   Benton Rio, [facility] [address] [place], [place] [address] Phone: [phone] Fax: [phone]  03/03/2024, 11:23 AM

## 2024-07-05 ENCOUNTER — Encounter: Payer: Self-pay | Admitting: Nurse Practitioner

## 2024-07-05 ENCOUNTER — Ambulatory Visit: Admitting: Nurse Practitioner

## 2024-07-05 VITALS — BP 132/80 | HR 79 | Ht 63.0 in | Wt 251.0 lb

## 2024-07-05 DIAGNOSIS — E782 Mixed hyperlipidemia: Secondary | ICD-10-CM

## 2024-07-05 DIAGNOSIS — Z7984 Long term (current) use of oral hypoglycemic drugs: Secondary | ICD-10-CM

## 2024-07-05 DIAGNOSIS — E1122 Type 2 diabetes mellitus with diabetic chronic kidney disease: Secondary | ICD-10-CM

## 2024-07-05 DIAGNOSIS — Z794 Long term (current) use of insulin: Secondary | ICD-10-CM

## 2024-07-05 DIAGNOSIS — I10 Essential (primary) hypertension: Secondary | ICD-10-CM | POA: Diagnosis not present

## 2024-07-05 DIAGNOSIS — N1832 Chronic kidney disease, stage 3b: Secondary | ICD-10-CM

## 2024-07-05 DIAGNOSIS — E041 Nontoxic single thyroid nodule: Secondary | ICD-10-CM

## 2024-07-05 LAB — POCT GLYCOSYLATED HEMOGLOBIN (HGB A1C): Hemoglobin A1C: 7.6 % — AB (ref 4.0–5.6)

## 2024-07-05 MED ORDER — SEMAGLUTIDE (1 MG/DOSE) 4 MG/3ML ~~LOC~~ SOPN: 1.0000 mg | PEN_INJECTOR | SUBCUTANEOUS | 1 refills | Status: AC

## 2024-07-05 MED ORDER — TRESIBA FLEXTOUCH 200 UNIT/ML ~~LOC~~ SOPN: 60.0000 [IU] | PEN_INJECTOR | Freq: Every day | SUBCUTANEOUS | 3 refills | Status: AC

## 2024-07-05 MED ORDER — NOVOFINE PEN NEEDLE 32G X 6 MM MISC: 1.0000 | Freq: Every day | 3 refills | Status: AC

## 2024-07-05 NOTE — Progress Notes (Signed)
 Endocrinology Follow Up Note       07/05/2024, 11:03 AM   Subjective:    Patient ID: Abigail Jackson, female    DOB: 1966/08/23.  Abigail Jackson is being seen in follow up after being seen in follow up after being seen in consultation for management of currently uncontrolled symptomatic diabetes requested by  Gerome Tillman CROME, FNP.   Past Medical History:  Diagnosis Date   Arthritis    Diabetes mellitus without complication (HCC)    Heart murmur    Hyperlipidemia    Hypertension    Irregular bowel habits    Renal neoplasm    LEFT    Past Surgical History:  Procedure Laterality Date   ROBOTIC ASSITED PARTIAL NEPHRECTOMY Left 08/03/2015   Procedure: ROBOTIC ASSISTED PARTIAL LEFT NEPHRECTOMY;  Surgeon: Gretel Ferrara, MD;  Location: [facility];  Service: Urology;  Laterality: Left;   SHOULDER SURGERY  2006   RIGHT    Social History   Socioeconomic History   Marital status: Married    Spouse name: Not on file   Number of children: Not on file   Years of education: Not on file   Highest education level: Not on file  Occupational History   Not on file  Tobacco Use   Smoking status: Never   Smokeless tobacco: Never  Vaping Use   Vaping status: Never Used  Substance and Sexual Activity   Alcohol use: No   Drug use: No   Sexual activity: Not on file  Other Topics Concern   Not on file  Social History Narrative   Not on file   Social Drivers of Health   Financial Resource Strain: Not on file  Food Insecurity: Not on file  Transportation Needs: Not on file  Physical Activity: Not on file  Stress: Not on file  Social Connections: Not on file    Family History  Problem Relation Age of Onset   Diabetes Mother    Hypertension Mother    Diabetes Father    Hypertension Father    Thyroid  disease Father    Hyperlipidemia Father    Kidney disease Father    Heart failure Father     Outpatient  Encounter Medications as of 07/05/2024  Medication Sig   acetaminophen  (TYLENOL ) 325 MG tablet Take by mouth. (Patient taking differently: Take 325 mg by mouth every 6 (six) hours as needed. As needed)   amitriptyline  (ELAVIL ) 100 MG tablet Take 100 mg by mouth at bedtime.   amLODipine (NORVASC) 5 MG tablet Take 1 tablet by mouth daily.   aspirin 81 MG EC tablet 1 tablet   cholecalciferol (VITAMIN D ) 400 units TABS tablet Take 400 Units by mouth.   dicyclomine (BENTYL) 10 MG capsule Take 1 capsule by mouth 2 (two) times daily.   Easy Touch Lancets 30G MISC USE AS DIRECTED TO TEST BLOOD GLUCOSE FOUR TIMES A DAY   ezetimibe (ZETIA) 10 MG tablet Take 1 tablet by mouth daily.   fenofibrate (TRICOR) 145 MG tablet Take 145 mg by mouth daily.   Insulin  Pen Needle (EASY TOUCH PEN NEEDLES) 31G X 8 MM MISC Use with insulin  bid. E11.65   metoprolol  tartrate (  LOPRESSOR ) 50 MG tablet Take 50 mg by mouth daily.   omeprazole  (PRILOSEC) 40 MG capsule Take 1 capsule (40 mg total) by mouth at bedtime.   ONETOUCH ULTRA test strip USE TO TEST BLOOD SUGAR FOUR TIMES A DAY AS DIRECTED   oxyCODONE  (OXY IR/ROXICODONE ) 5 MG immediate release tablet Take by mouth. (Patient taking differently: 5 mg every 6 (six) hours as needed. As needed)   pregabalin  (LYRICA ) 50 MG capsule Take 50 mg by mouth 2 (two) times daily. (Patient taking differently: Take 50 mg by mouth 3 (three) times daily.)   [DISCONTINUED] insulin  degludec (TRESIBA  FLEXTOUCH) 200 UNIT/ML FlexTouch Pen Inject 70 Units into the skin at bedtime.   [DISCONTINUED] Insulin  Pen Needle (NOVOFINE PEN NEEDLE) 32G X 6 MM MISC 1 Box by Does not apply route at bedtime.   [DISCONTINUED] Semaglutide , 1 MG/DOSE, 4 MG/3ML SOPN Inject 1 mg as directed once a week.   insulin  degludec (TRESIBA  FLEXTOUCH) 200 UNIT/ML FlexTouch Pen Inject 60 Units into the skin at bedtime.   Insulin  Pen Needle (NOVOFINE PEN NEEDLE) 32G X 6 MM MISC 1 Box by Does not apply route at bedtime.    Semaglutide , 1 MG/DOSE, 4 MG/3ML SOPN Inject 1 mg as directed once a week.   [DISCONTINUED] metoprolol  succinate (TOPROL -XL) 50 MG 24 hr tablet Take 1 tablet (50 mg total) by mouth daily. Take with or immediately following a meal. (Patient not taking: Reported on 07/05/2024)   No facility-administered encounter medications on file as of 07/05/2024.    ALLERGIES: Allergies  Allergen Reactions   Tramadol Itching and Rash    VACCINATION STATUS: Immunization History  Administered Date(s) Administered   Moderna Covid-19 Fall Seasonal Vaccine 74yrs & older 06/26/2022   Moderna Sars-Covid-2 Vaccination 05/24/2020, 06/21/2020    Diabetes She presents for her follow-up diabetic visit. She has type 2 diabetes mellitus. Onset time: Diagnosed at approx age of 40. Her disease course has been improving. There are no hypoglycemic associated symptoms. Associated symptoms include fatigue. There are no hypoglycemic complications. Symptoms are stable. Diabetic complications include nephropathy. Risk factors for coronary artery disease include diabetes mellitus, dyslipidemia, family history, obesity, hypertension and sedentary lifestyle. Current diabetic treatment includes insulin  injections (and Ozempic ). She is compliant with treatment most of the time. Her weight is decreasing steadily. She is following a generally healthy diet. When asked about meal planning, she reported none. She has not had a previous visit with a dietitian. She rarely participates in exercise. Her home blood glucose trend is decreasing steadily. Her overall blood glucose range is 140-180 mg/dl. (She presents today with her CGM showing at goal glycemic profile overall.  Her POCT A1c today is 7.6%, improving from last visit of 7.9%.  Analysis of her CGM shows TIR 86%, TAR 14%, TBR 0% with a GMI of 6.8%.  She denies any significant hypoglycemia.) An ACE inhibitor/angiotensin II receptor blocker is being taken. She does not see a podiatrist.Eye  exam is current.  Hyperlipidemia This is a chronic problem. The current episode started more than 1 year ago. The problem is uncontrolled. Recent lipid tests were reviewed and are high. Exacerbating diseases include chronic renal disease, diabetes and obesity. Factors aggravating her hyperlipidemia include beta blockers, thiazides and fatty foods. Current antihyperlipidemic treatment includes statins. The current treatment provides moderate improvement of lipids. Compliance problems include adherence to diet and adherence to exercise.  Risk factors for coronary artery disease include diabetes mellitus, dyslipidemia, family history, hypertension, obesity and a sedentary lifestyle.  Hypertension This is  a chronic problem. The current episode started more than 1 year ago. The problem has been waxing and waning since onset. The problem is uncontrolled. There are no associated agents to hypertension. Risk factors for coronary artery disease include diabetes mellitus, dyslipidemia, family history, obesity and sedentary lifestyle. Past treatments include beta blockers, diuretics and ACE inhibitors. The current treatment provides moderate improvement. There are no compliance problems.  Hypertensive end-organ damage includes kidney disease. with hx of left nephrectomy. Identifiable causes of hypertension include chronic renal disease and sleep apnea.     Review of systems  Constitutional: + decreasing body weight,  current Body mass index is 44.46 kg/m. , + fatigue, no subjective hyperthermia, no subjective hypothermia Eyes: no blurry vision, no xerophthalmia ENT: no sore throat, no nodules palpated in throat, no dysphagia/odynophagia, no hoarseness Cardiovascular: no chest pain, no shortness of breath, no palpitations, no leg swelling Respiratory: no cough, no shortness of breath Gastrointestinal: no nausea/vomiting/diarrhea Musculoskeletal: c/o intermittent back pain- walks with walker- does have history  of frequent falls Skin: no rashes, no hyperemia Neurological: no tremors, no numbness, no tingling, no dizziness Psychiatric: no depression, no anxiety  Objective:     BP 132/80 (BP Location: Left Arm, Patient Position: Sitting, Cuff Size: Large)   Pulse 79   Ht 5' 3 (1.6 m)   Wt 251 lb (113.9 kg)   BMI 44.46 kg/m   Wt Readings from Last 3 Encounters:  07/05/24 251 lb (113.9 kg)  03/03/24 252 lb 12.8 oz (114.7 kg)  11/21/23 259 lb 3.2 oz (117.6 kg)     BP Readings from Last 3 Encounters:  07/05/24 132/80  03/03/24 134/80  11/21/23 138/78     Physical Exam- Limited  Constitutional:  Body mass index is 44.46 kg/m., not in acute distress, normal state of mind Eyes:  EOMI, no exophthalmos Musculoskeletal: no gross deformities, strength intact in all four extremities, no gross restriction of joint movements, walks with walker Skin:  no rashes, no hyperemia Neurological: no tremor with outstretched hands    Diabetic Foot Exam - Simple   No data filed      CMP ( most recent) CMP     Component Value Date/Time   NA 140 02/05/2022 0000   K 4.6 02/05/2022 0000   CL 104 02/05/2022 0000   CO2 28 (A) 02/05/2022 0000   GLUCOSE 130 (H) 09/03/2021 0807   BUN 20 02/14/2023 0000   CREATININE 1.4 (A) 02/14/2023 0000   CREATININE 1.44 (H) 09/03/2021 0807   CALCIUM 10.4 02/05/2022 0000   PROT 6.9 09/03/2021 0807   ALBUMIN 4.2 02/05/2022 0000   AST 23 02/05/2022 0000   ALT 23 02/05/2022 0000   ALKPHOS 43 02/05/2022 0000   BILITOT 0.4 09/03/2021 0807   GFRNONAA 41 (L) 04/13/2018 1113   GFRAA 39 12/26/2020 0000   GFRAA 47 (L) 04/13/2018 1113     Diabetic Labs (most recent): Lab Results  Component Value Date   HGBA1C 7.6 (A) 07/05/2024   HGBA1C 7.9 (A) 11/21/2023   HGBA1C 8.7 (A) 08/22/2023   MICROALBUR 0.5 02/05/2022   MICROALBUR 1.7 12/10/2017     Lipid Panel ( most recent) Lipid Panel     Component Value Date/Time   CHOL 216 (A) 02/05/2022 0000   TRIG 184  (A) 02/14/2023 0000   HDL 45 02/05/2022 0000   CHOLHDL 4.2 09/03/2021 0807   LDLCALC 148 02/14/2023 0000   LDLCALC 159 (H) 09/03/2021 0807      Lab Results  Component  Value Date   TSH 1.31 02/14/2023   TSH 1.41 02/05/2022   TSH 2.44 09/03/2021   TSH 1.96 08/30/2020   TSH 2.547 08/06/2015   FREET4 1.1 09/03/2021           Assessment & Plan:   1) Controlled Type 2 Diabetes with kidney complications:  She presents today with her CGM showing at goal glycemic profile overall.  Her POCT A1c today is 7.6%, improving from last visit of 7.9%.  Analysis of her CGM shows TIR 86%, TAR 14%, TBR 0% with a GMI of 6.8%.  She denies any significant hypoglycemia.  - Abigail Jackson has currently uncontrolled symptomatic type 2 DM since 58 years of age.   -Recent labs reviewed.  - I had a long discussion with her about the progressive nature of diabetes and the pathology behind its complications. -her diabetes is complicated by CKD 3 and she remains at a high risk for more acute and chronic complications which include CAD, CVA, CKD, retinopathy, and neuropathy. These are all discussed in detail with her.  - Nutritional counseling repeated/built upon at each appointment.  - The patient admits there is a room for improvement in their diet and drink choices. -  Suggestion is made for the patient to avoid simple carbohydrates from their diet including Cakes, Sweet Desserts / Pastries, Ice Cream, Soda (diet and regular), Sweet Tea, Candies, Chips, Cookies, Sweet Pastries, Store Bought Juices, Alcohol in Excess of 1-2 drinks a day, Artificial Sweeteners, Coffee Creamer, and Sugar-free Products. This will help patient to have stable blood glucose profile and potentially avoid unintended weight gain.   - I encouraged the patient to switch to unprocessed or minimally processed complex starch and increased protein intake (animal or plant source), fruits, and vegetables.   - Patient is advised to  stick to a routine mealtimes to eat 3 meals a day and avoid unnecessary snacks (to snack only to correct hypoglycemia).  - she will be scheduled with Santana Duke, RDN, CDE for diabetes education.  - I have approached her with the following individualized plan to manage her diabetes and patient agrees:   -Will lower her Tresiba  slightly to 60 units SQ nightly and continue Ozempic  1 mg SQ weekly.   -She is encouraged to continue monitoring blood glucose twice daily (using her CGM), before breakfast and before bed, and to call the clinic if she has readings less than 70 or greater than 300 for 3 tests in a row.  She is benefiting from CGM device (from [employer]).  Will recommend she stay on this device, despite some recent concerns with quality.  - she is warned not to take insulin  without proper monitoring per orders. - Adjustment parameters are given to her for hypo and hyperglycemia in writing.  - she is not a candidate for Metformin due to concurrent renal insufficiency.  - Specific targets for  A1c; LDL, HDL, and Triglycerides were discussed with the patient.  2) Blood Pressure /Hypertension:  her blood pressure is controlled to target.   she is advised to continue her current medications as prescribed by PCP.  3) Lipids/Hyperlipidemia:    Review of her recent lipid panel from 01/21/24 showed uncontrolled LDL at 125 (improving) .  she is advised to continue Lipitor 20 mg daily at bedtime and Tricor 145 mg po daily.  Side effects and precautions discussed with her.    4)  Weight/Diet:  her Body mass index is 44.46 kg/m.  -  clearly complicating her  diabetes care.   she is a candidate for weight loss. I discussed with her the fact that loss of 5 - 10% of her  current body weight will have the most impact on her diabetes management.  Exercise, and detailed carbohydrates information provided  -  detailed on discharge instructions.  5) Thyroid  nodule, incidentally found on imaging She was  seen in ED in [place] for angina and had imaging identifying a thyroid  nodule.  She did have nodule that warrants annual follow up, due around 11/2023.  Her repeat thyroid  ultrasound on 11/04/23 shows stable 1.2 cm isthmus nodule, recommending continued annual surveillance until 5 years stability is reached.  I have ordered this to be done prior to next visit in March.  6) Chronic Care/Health Maintenance: -she is on ACEI/ARB and Statin medications and is encouraged to initiate and continue to follow up with Ophthalmology, Dentist, Podiatrist at least yearly or according to recommendations, and advised to stay away from smoking. I have recommended yearly flu vaccine and pneumonia vaccine at least every 5 years; moderate intensity exercise for up to 150 minutes weekly; and sleep for at least 7 hours a day.  - she is advised to maintain close follow up with Gerome Tillman CROME, FNP for primary care needs, as well as her other providers for optimal and coordinated care.     I spent  33  minutes in the care of the patient today including review of labs from CMP, Lipids, Thyroid  Function, Hematology (current and previous including abstractions from other facilities); face-to-face time discussing  her blood glucose readings/logs, discussing hypoglycemia and hyperglycemia episodes and symptoms, medications doses, her options of short and long term treatment based on the latest standards of care / guidelines;  discussion about incorporating lifestyle medicine;  and documenting the encounter. Risk reduction counseling performed per USPSTF guidelines to reduce obesity and cardiovascular risk factors.     Please refer to Patient Instructions for Blood Glucose Monitoring and Insulin /Medications Dosing Guide  in media tab for additional information. Please  also refer to  Patient Self Inventory in the Media  tab for reviewed elements of pertinent patient history.  Abigail Jackson participated in the discussions,  expressed understanding, and voiced agreement with the above plans.  All questions were answered to her satisfaction. she is encouraged to contact clinic should she have any questions or concerns prior to her return visit.     Follow up plan: - Return in about 4 months (around 11/02/2024) for Diabetes F/U with A1c in office, Bring meter and logs, Thyroid  follow up, thyroid  ultrasound.   Benton Rio, [facility] [address] [place], [place] [address] Phone: [phone] Fax: [phone]  07/05/2024, 11:03 AM

## 2024-08-25 ENCOUNTER — Other Ambulatory Visit: Payer: Self-pay

## 2024-08-25 DIAGNOSIS — N1832 Chronic kidney disease, stage 3b: Secondary | ICD-10-CM

## 2024-08-25 DIAGNOSIS — Z794 Long term (current) use of insulin: Secondary | ICD-10-CM

## 2024-08-25 DIAGNOSIS — N183 Chronic kidney disease, stage 3 unspecified: Secondary | ICD-10-CM

## 2024-08-25 MED ORDER — ONETOUCH ULTRA VI STRP: ORAL_STRIP | 1 refills | Status: DC

## 2024-08-25 MED ORDER — EASY TOUCH LANCETS 30G MISC: 1 refills | Status: DC

## 2024-09-17 ENCOUNTER — Telehealth: Payer: Self-pay | Admitting: *Deleted

## 2024-09-17 NOTE — Telephone Encounter (Signed)
 Patient left a message asking if she can take her Tresiba  any time she wants too? Reviewed Patient's chart, she is to inject 60 units SQ daily at bedtime.  Patient was called and made aware.

## 2024-09-30 ENCOUNTER — Other Ambulatory Visit: Payer: Self-pay | Admitting: *Deleted

## 2024-09-30 DIAGNOSIS — Z794 Long term (current) use of insulin: Secondary | ICD-10-CM

## 2024-09-30 MED ORDER — ACCU-CHEK FASTCLIX LANCETS MISC: 6 refills | Status: AC

## 2024-09-30 MED ORDER — ACCU-CHEK GUIDE ME W/DEVICE KIT: PACK | 0 refills | Status: AC

## 2024-09-30 MED ORDER — ACCU-CHEK GUIDE TEST VI STRP: ORAL_STRIP | 5 refills | Status: AC

## 2024-09-30 NOTE — Telephone Encounter (Signed)
 Insurance prefers Electrical [employer]. Patient will need New Reader for either. This will be sent to [facility] per their request.

## 2024-11-02 ENCOUNTER — Other Ambulatory Visit (HOSPITAL_COMMUNITY)

## 2024-11-17 ENCOUNTER — Ambulatory Visit: Admitting: Nurse Practitioner
